# Patient Record
Sex: Female | Born: 1945 | Race: White | Hispanic: No | Marital: Married
Health system: Midwestern US, Community
[De-identification: ages and names within clinical notes are randomized; demographics above are authoritative.]

## PROBLEM LIST (undated history)

## (undated) DIAGNOSIS — E039 Hypothyroidism, unspecified: Secondary | ICD-10-CM

## (undated) DIAGNOSIS — I639 Cerebral infarction, unspecified: Secondary | ICD-10-CM

## (undated) DIAGNOSIS — I10 Essential (primary) hypertension: Secondary | ICD-10-CM

## (undated) DIAGNOSIS — E119 Type 2 diabetes mellitus without complications: Secondary | ICD-10-CM

## (undated) DIAGNOSIS — N2889 Other specified disorders of kidney and ureter: Secondary | ICD-10-CM

---

## 2003-02-26 ENCOUNTER — Other Ambulatory Visit: Admission: RE | Admit: 2003-02-26 | Discharge: 2003-02-26 | Payer: Self-pay | Admitting: Family Medicine

## 2003-06-06 ENCOUNTER — Encounter: Admission: RE | Admit: 2003-06-06 | Discharge: 2003-06-06 | Payer: Self-pay | Admitting: Family Medicine

## 2011-03-15 DIAGNOSIS — K219 Gastro-esophageal reflux disease without esophagitis: Secondary | ICD-10-CM | POA: Insufficient documentation

## 2011-03-15 DIAGNOSIS — F32A Depression, unspecified: Secondary | ICD-10-CM | POA: Insufficient documentation

## 2011-03-15 DIAGNOSIS — G2581 Restless legs syndrome: Secondary | ICD-10-CM | POA: Insufficient documentation

## 2011-03-15 DIAGNOSIS — E039 Hypothyroidism, unspecified: Secondary | ICD-10-CM | POA: Diagnosis present

## 2011-03-15 DIAGNOSIS — I10 Essential (primary) hypertension: Secondary | ICD-10-CM | POA: Diagnosis present

## 2013-06-12 DIAGNOSIS — E785 Hyperlipidemia, unspecified: Secondary | ICD-10-CM | POA: Insufficient documentation

## 2017-05-17 DIAGNOSIS — M5136 Other intervertebral disc degeneration, lumbar region: Secondary | ICD-10-CM | POA: Insufficient documentation

## 2019-12-26 ENCOUNTER — Inpatient Hospital Stay (HOSPITAL_COMMUNITY)
Admission: EM | Admit: 2019-12-26 | Discharge: 2019-12-30 | DRG: 177 | Disposition: A | Discharge status: Home Health | Payer: Medicare Other | Attending: Internal Medicine | Admitting: Internal Medicine

## 2019-12-26 ENCOUNTER — Emergency Department (HOSPITAL_COMMUNITY): Payer: Medicare Other

## 2019-12-26 ENCOUNTER — Encounter (HOSPITAL_COMMUNITY): Payer: Self-pay | Admitting: Family Medicine

## 2019-12-26 ENCOUNTER — Other Ambulatory Visit: Payer: Self-pay

## 2019-12-26 DIAGNOSIS — G2581 Restless legs syndrome: Secondary | ICD-10-CM | POA: Diagnosis present

## 2019-12-26 DIAGNOSIS — R7989 Other specified abnormal findings of blood chemistry: Secondary | ICD-10-CM | POA: Diagnosis present

## 2019-12-26 DIAGNOSIS — M549 Dorsalgia, unspecified: Secondary | ICD-10-CM | POA: Diagnosis present

## 2019-12-26 DIAGNOSIS — E1140 Type 2 diabetes mellitus with diabetic neuropathy, unspecified: Secondary | ICD-10-CM | POA: Diagnosis present

## 2019-12-26 DIAGNOSIS — G9341 Metabolic encephalopathy: Secondary | ICD-10-CM | POA: Diagnosis present

## 2019-12-26 DIAGNOSIS — J9601 Acute respiratory failure with hypoxia: Secondary | ICD-10-CM | POA: Diagnosis present

## 2019-12-26 DIAGNOSIS — R0602 Shortness of breath: Secondary | ICD-10-CM

## 2019-12-26 DIAGNOSIS — G894 Chronic pain syndrome: Secondary | ICD-10-CM | POA: Diagnosis present

## 2019-12-26 DIAGNOSIS — I444 Left anterior fascicular block: Secondary | ICD-10-CM | POA: Diagnosis present

## 2019-12-26 DIAGNOSIS — I10 Essential (primary) hypertension: Secondary | ICD-10-CM | POA: Diagnosis present

## 2019-12-26 DIAGNOSIS — G8929 Other chronic pain: Secondary | ICD-10-CM | POA: Diagnosis present

## 2019-12-26 DIAGNOSIS — R9431 Abnormal electrocardiogram [ECG] [EKG]: Secondary | ICD-10-CM | POA: Diagnosis present

## 2019-12-26 DIAGNOSIS — E1142 Type 2 diabetes mellitus with diabetic polyneuropathy: Secondary | ICD-10-CM

## 2019-12-26 DIAGNOSIS — E876 Hypokalemia: Secondary | ICD-10-CM | POA: Diagnosis present

## 2019-12-26 DIAGNOSIS — J1282 Pneumonia due to coronavirus disease 2019: Secondary | ICD-10-CM | POA: Diagnosis present

## 2019-12-26 DIAGNOSIS — U071 COVID-19: Secondary | ICD-10-CM | POA: Diagnosis present

## 2019-12-26 DIAGNOSIS — E039 Hypothyroidism, unspecified: Secondary | ICD-10-CM | POA: Diagnosis present

## 2019-12-26 DIAGNOSIS — R0902 Hypoxemia: Secondary | ICD-10-CM

## 2019-12-26 DIAGNOSIS — E785 Hyperlipidemia, unspecified: Secondary | ICD-10-CM | POA: Diagnosis present

## 2019-12-26 DIAGNOSIS — F32A Depression, unspecified: Secondary | ICD-10-CM | POA: Diagnosis present

## 2019-12-26 DIAGNOSIS — M545 Low back pain, unspecified: Secondary | ICD-10-CM | POA: Diagnosis not present

## 2019-12-26 DIAGNOSIS — E119 Type 2 diabetes mellitus without complications: Secondary | ICD-10-CM

## 2019-12-26 LAB — RESPIRATORY PANEL BY PCR

## 2019-12-26 LAB — COMPREHENSIVE METABOLIC PANEL
ALT: 58 U/L — ABNORMAL HIGH (ref 0–44)
AST: 64 U/L — ABNORMAL HIGH (ref 15–41)
Albumin: 3.9 g/dL (ref 3.5–5.0)
Alkaline Phosphatase: 69 U/L (ref 38–126)
Anion gap: 13 (ref 5–15)
BUN: 17 mg/dL (ref 8–23)
CO2: 30 mmol/L (ref 22–32)
Calcium: 9.9 mg/dL (ref 8.9–10.3)
Chloride: 97 mmol/L — ABNORMAL LOW (ref 98–111)
Creatinine, Ser: 0.62 mg/dL (ref 0.44–1.00)
GFR, Estimated: >60 mL/min (ref >60)
Glucose, Bld: 120 mg/dL — ABNORMAL HIGH (ref 70–99)
Potassium: 3.4 mmol/L — ABNORMAL LOW (ref 3.5–5.1)
Sodium: 140 mmol/L (ref 135–145)
Total Bilirubin: 0.8 mg/dL (ref 0.3–1.2)
Total Protein: 7.6 g/dL (ref 6.5–8.1)

## 2019-12-26 LAB — CBC WITH DIFFERENTIAL/PLATELET
Abs Immature Granulocytes: 0.01 10*3/uL (ref 0.00–0.07)
Basophils Absolute: 0 10*3/uL (ref 0.0–0.1)
Basophils Relative: 0 %
Eosinophils Absolute: 0 10*3/uL (ref 0.0–0.5)
Eosinophils Relative: 0 %
HCT: 42.5 % (ref 36.0–46.0)
Hemoglobin: 13.8 g/dL (ref 12.0–15.0)
Immature Granulocytes: 0 %
Lymphocytes Relative: 14 %
Lymphs Abs: 0.6 10*3/uL — ABNORMAL LOW (ref 0.7–4.0)
MCH: 32.9 pg (ref 26.0–34.0)
MCHC: 32.5 g/dL (ref 30.0–36.0)
MCV: 101.2 fL — ABNORMAL HIGH (ref 80.0–100.0)
Monocytes Absolute: 0.4 10*3/uL (ref 0.1–1.0)
Monocytes Relative: 9 %
Neutro Abs: 3.4 10*3/uL (ref 1.7–7.7)
Neutrophils Relative %: 77 %
Platelets: 164 10*3/uL (ref 150–400)
RBC: 4.2 MIL/uL (ref 3.87–5.11)
RDW: 13.7 % (ref 11.5–15.5)
WBC: 4.5 10*3/uL (ref 4.0–10.5)
nRBC: 0 % (ref 0.0–0.2)

## 2019-12-26 LAB — GLUCOSE, CAPILLARY: Glucose-Capillary: 162 mg/dL — ABNORMAL HIGH (ref 70–99)

## 2019-12-26 LAB — C-REACTIVE PROTEIN: CRP: 6.7 mg/dL — ABNORMAL HIGH (ref <1.0)

## 2019-12-26 LAB — RESPIRATORY PANEL BY RT PCR (FLU A&B, COVID)
Influenza A by PCR: NEGATIVE
Influenza B by PCR: NEGATIVE
SARS Coronavirus 2 by RT PCR: POSITIVE — AB

## 2019-12-26 LAB — FIBRINOGEN: Fibrinogen: 580 mg/dL — ABNORMAL HIGH (ref 210–475)

## 2019-12-26 LAB — PROCALCITONIN: Procalcitonin: <0.1 ng/mL

## 2019-12-26 LAB — D-DIMER, QUANTITATIVE (NOT AT ARMC): D-Dimer, Quant: 0.73 ug/mL-FEU — ABNORMAL HIGH (ref 0.00–0.50)

## 2019-12-26 LAB — LACTATE DEHYDROGENASE: LDH: 194 U/L — ABNORMAL HIGH (ref 98–192)

## 2019-12-26 MED ORDER — GUAIFENESIN-DM 100-10 MG/5ML PO SYRP: 10.0000 mL | ORAL_SOLUTION | ORAL | Status: DC | PRN

## 2019-12-26 MED ORDER — POTASSIUM CHLORIDE CRYS ER 20 MEQ PO TBCR
20.0000 meq | EXTENDED_RELEASE_TABLET | Freq: Once | ORAL | Status: AC
  Administered 2019-12-26: 20 meq via ORAL
  Filled 2019-12-26: qty 1

## 2019-12-26 MED ORDER — SODIUM CHLORIDE 0.9% FLUSH
3.0000 mL | Freq: Two times a day (BID) | INTRAVENOUS | Status: DC
  Administered 2019-12-26 – 2019-12-30 (×8): 3 mL via INTRAVENOUS

## 2019-12-26 MED ORDER — INSULIN ASPART 100 UNIT/ML ~~LOC~~ SOLN
0.0000 [IU] | Freq: Three times a day (TID) | SUBCUTANEOUS | Status: DC
  Administered 2019-12-27: 2 [IU] via SUBCUTANEOUS
  Administered 2019-12-27: 1 [IU] via SUBCUTANEOUS
  Administered 2019-12-27: 2 [IU] via SUBCUTANEOUS
  Administered 2019-12-28: 1 [IU] via SUBCUTANEOUS
  Administered 2019-12-28: 2 [IU] via SUBCUTANEOUS
  Filled 2019-12-26: qty 0.09

## 2019-12-26 MED ORDER — DULOXETINE HCL 60 MG PO CPEP
60.0000 mg | ORAL_CAPSULE | Freq: Every day | ORAL | Status: DC
  Administered 2019-12-26 – 2019-12-30 (×5): 60 mg via ORAL
  Filled 2019-12-26 (×2): qty 1
  Filled 2019-12-26: qty 2
  Filled 2019-12-26 (×2): qty 1

## 2019-12-26 MED ORDER — TRAMADOL HCL 50 MG PO TABS
50.0000 mg | ORAL_TABLET | Freq: Four times a day (QID) | ORAL | Status: DC | PRN
  Administered 2019-12-26 – 2019-12-28 (×3): 50 mg via ORAL
  Filled 2019-12-26 (×4): qty 1

## 2019-12-26 MED ORDER — HYDROCOD POLST-CPM POLST ER 10-8 MG/5ML PO SUER
5.0000 mL | Freq: Two times a day (BID) | ORAL | Status: DC | PRN
  Administered 2019-12-26: 5 mL via ORAL
  Filled 2019-12-26: qty 5

## 2019-12-26 MED ORDER — SODIUM CHLORIDE 0.9 % IV SOLN
200.0000 mg | Freq: Once | INTRAVENOUS | Status: AC
  Administered 2019-12-26: 200 mg via INTRAVENOUS
  Filled 2019-12-26: qty 40

## 2019-12-26 MED ORDER — LEVOTHYROXINE SODIUM 25 MCG PO TABS
125.0000 ug | ORAL_TABLET | Freq: Every day | ORAL | Status: DC
  Administered 2019-12-27 – 2019-12-30 (×4): 125 ug via ORAL
  Filled 2019-12-26 (×4): qty 1

## 2019-12-26 MED ORDER — HYDROCHLOROTHIAZIDE 25 MG PO TABS
50.0000 mg | ORAL_TABLET | Freq: Every day | ORAL | Status: DC
  Administered 2019-12-26 – 2019-12-30 (×5): 50 mg via ORAL
  Filled 2019-12-26 (×5): qty 2

## 2019-12-26 MED ORDER — AMITRIPTYLINE HCL 25 MG PO TABS
25.0000 mg | ORAL_TABLET | Freq: Every day | ORAL | Status: DC
  Administered 2019-12-26 – 2019-12-29 (×4): 25 mg via ORAL
  Filled 2019-12-26 (×4): qty 1

## 2019-12-26 MED ORDER — ZINC SULFATE 220 (50 ZN) MG PO CAPS
220.0000 mg | ORAL_CAPSULE | Freq: Every day | ORAL | Status: DC
  Administered 2019-12-27 – 2019-12-30 (×4): 220 mg via ORAL
  Filled 2019-12-26 (×4): qty 1

## 2019-12-26 MED ORDER — SODIUM CHLORIDE 0.9 % IV SOLN
100.0000 mg | Freq: Every day | INTRAVENOUS | Status: AC
  Administered 2019-12-27 – 2019-12-30 (×4): 100 mg via INTRAVENOUS
  Filled 2019-12-26 (×4): qty 20

## 2019-12-26 MED ORDER — ACETAMINOPHEN 325 MG PO TABS: 650.0000 mg | ORAL_TABLET | Freq: Four times a day (QID) | ORAL | Status: DC | PRN

## 2019-12-26 MED ORDER — ENOXAPARIN SODIUM 40 MG/0.4ML ~~LOC~~ SOLN
40.0000 mg | SUBCUTANEOUS | Status: DC
  Administered 2019-12-27 – 2019-12-30 (×4): 40 mg via SUBCUTANEOUS
  Filled 2019-12-26 (×4): qty 0.4

## 2019-12-26 MED ORDER — ASCORBIC ACID 500 MG PO TABS
500.0000 mg | ORAL_TABLET | Freq: Every day | ORAL | Status: DC
  Administered 2019-12-27 – 2019-12-30 (×4): 500 mg via ORAL
  Filled 2019-12-26 (×4): qty 1

## 2019-12-26 MED ORDER — ATENOLOL 50 MG PO TABS
50.0000 mg | ORAL_TABLET | Freq: Every day | ORAL | Status: DC
  Administered 2019-12-26 – 2019-12-29 (×4): 50 mg via ORAL
  Filled 2019-12-26 (×5): qty 1

## 2019-12-26 MED ORDER — INSULIN ASPART 100 UNIT/ML ~~LOC~~ SOLN
0.0000 [IU] | Freq: Every day | SUBCUTANEOUS | Status: DC
  Administered 2019-12-27: 2 [IU] via SUBCUTANEOUS
  Filled 2019-12-26: qty 0.05

## 2019-12-26 MED ORDER — METHYLPREDNISOLONE SODIUM SUCC 125 MG IJ SOLR
60.0000 mg | Freq: Two times a day (BID) | INTRAMUSCULAR | Status: DC
  Administered 2019-12-26 – 2019-12-30 (×8): 60 mg via INTRAVENOUS
  Filled 2019-12-26 (×8): qty 2

## 2019-12-26 NOTE — ED Notes (Signed)
   12/26/19 1830  Vitals  BP (!) 185/114  MAP (mmHg) 133  Pulse Rate (!) 101  ECG Heart Rate (!) 103  Resp 18  MEWS COLOR  MEWS Score Color Green  Oxygen Therapy  SpO2 95 %  O2 Device Nasal Cannula  O2 Flow Rate (L/min) 3 L/min  MEWS Score  MEWS Temp 0  MEWS Systolic 0  MEWS Pulse 1  MEWS RR 0  MEWS LOC 0  MEWS Score 1  Grunz, MD aware aware of patient's elevated BP.  Daily BP meds ordered.  Will give when received from pharmacy.

## 2019-12-26 NOTE — H&P (Signed)
History and Physical   Rhonda Winters:[id number] DOB: [birth year] DOA: 12/26/2019  Referring MD/NP/PA: Dr. Eulis Foster, [place] PCP: [facility]  Patient coming from: Home  Chief Complaint: Poor per oral intake  HPI: Rhonda Winters is a 74 y.o. female with a history of HTN, NIDT2DM, hypothyroidism, chronic back pain who presented to the ED 10/28 with lethargy, confusion, poor oral intake for 4 days. Her husband supplements history stating 4 nights ago she went to bed feeling feverish and has since become more and more weak with cough, not taking medications. She's had nausea and some dry heaves intermittently associated with this. Daughter recently diagnosed with covid and had spent time around the patient.  ED Course: 99.23F, tachycardic, hypertensive, and hypoxic requiring 3L O2. CXR revealed bilateral infiltrates most prominent in LUL, right base, SARS-CoV-2 PCR positive, and CRP 6.7. Hospitalists called to admit for acute hypoxic respiratory failure due to covid-19 pneumonia.    Review of Systems: Denies sore throat, no current chest pain, palpitations, shortness of breath, orthopnea, leg swelling (in fact these are often swollen but less so now), abdominal pain, changes in bowel habits, blood in stool, change in bladder habits, myalgias, arthralgias, rash, and per HPI. All others reviewed and are negative.   PMH: T2DM on metformin, HLD with statin intolerance, HTN on atenolol, HCTZ, possible renal CA under surveillance per pt's husband, chronic back pain getting intermittent steroid injections, taking diclofenac, cymbalta, tramadol. Not on DMARDs. No hx liver disease, TB, or dyscrasias.  PSH: No surgeries reported  Meds: As above.   Allergies: NKDA, rosuvastatin caused thigh myalgias  FH: No history of immunodeficiencies.  SH: Lives with husband in [place], no tobacco use, drinks a glass of wine on average every 6-38 days, no illicit drugs reported.  Physical Exam: Vitals:   12/26/19  1600 12/26/19 1630 12/26/19 1700 12/26/19 1705  BP: (!) 185/109 (!) 162/109 (!) 181/103   Pulse: 100  (!) 115   Resp: (!) 28 (!) 24 (!) 21   Temp:      TempSrc:      SpO2: 97%  95% 95%   Constitutional: 74 y.o. female in no distress, calm demeanor Eyes: Lids and conjunctivae normal, PERRL ENMT: Mucous membranes are moist. Fair dentition. Neck: normal, supple, no masses, no thyromegaly Respiratory: Non-labored breathing 3L O2 without accessory muscle use. Slight cracles at R base, diminished diffusely. Cardiovascular: Regular tachycardia, no murmurs, rubs, or gallops. No carotid bruits. No JVD. No pitting LE edema. Palpable pedal pulses. Abdomen: Normoactive bowel sounds. No tenderness, non-distended, and no masses palpated. No hepatosplenomegaly. GU: No indwelling catheter Musculoskeletal: No clubbing / cyanosis. No joint deformity upper and lower extremities. Good ROM, no contractures. Normal muscle tone.  Skin: Warm, dry. No rashes, wounds, or ulcers. No significant lesions noted.  Neurologic: CN II-XII grossly intact. Speech normal. No focal deficits in motor strength or sensation in all extremities.  Psychiatric: Alert and oriented x3, slowed cognition and impaired recall.   Labs on Admission: I have personally reviewed following labs and imaging studies  CBC: Recent Labs  Lab 12/26/19 1509  WBC 4.5  NEUTROABS 3.4  HGB 13.8  HCT 42.5  MCV 101.2*  PLT 756   Basic Metabolic Panel: Recent Labs  Lab 12/26/19 1509  NA 140  K 3.4*  CL 97*  CO2 30  GLUCOSE 120*  BUN 17  CREATININE 0.62  CALCIUM 9.9   GFR: CrCl cannot be calculated (Unknown ideal weight.). Liver Function Tests: Recent Labs  Lab 12/26/19  1509  AST 64*  ALT 58*  ALKPHOS 69  BILITOT 0.8  PROT 7.6  ALBUMIN 3.9   No results for input(s): LIPASE, AMYLASE in the last 168 hours. No results for input(s): AMMONIA in the last 168 hours. Coagulation Profile: No results for input(s): INR, PROTIME in the  last 168 hours. Cardiac Enzymes: No results for input(s): CKTOTAL, CKMB, CKMBINDEX, TROPONINI in the last 168 hours. BNP (last 3 results) No results for input(s): PROBNP in the last 8760 hours. HbA1C: No results for input(s): HGBA1C in the last 72 hours. CBG: No results for input(s): GLUCAP in the last 168 hours. Lipid Profile: No results for input(s): CHOL, HDL, LDLCALC, TRIG, CHOLHDL, LDLDIRECT in the last 72 hours. Thyroid Function Tests: No results for input(s): TSH, T4TOTAL, FREET4, T3FREE, THYROIDAB in the last 72 hours. Anemia Panel: No results for input(s): VITAMINB12, FOLATE, FERRITIN, TIBC, IRON, RETICCTPCT in the last 72 hours. Urine analysis: No results found for: COLORURINE, APPEARANCEUR, LABSPEC, PHURINE, GLUCOSEU, HGBUR, BILIRUBINUR, KETONESUR, PROTEINUR, UROBILINOGEN, NITRITE, LEUKOCYTESUR  Recent Results (from the past 240 hour(s))  Respiratory Panel by RT PCR (Flu A&B, Covid) - Nasopharyngeal Swab     Status: Abnormal   Collection Time: 12/26/19  3:11 PM   Specimen: Nasopharyngeal Swab  Result Value Ref Range Status   SARS Coronavirus 2 by RT PCR POSITIVE (A) NEGATIVE Final    Comment: RESULT CALLED TO, READ BACK BY AND VERIFIED WITH: FRANKLIN,C. RN @1709  ON 10.28.2021 BY COHEN,K (NOTE) SARS-CoV-2 target nucleic acids are DETECTED.  SARS-CoV-2 RNA is generally detectable in upper respiratory specimens  during the acute phase of infection. Positive results are indicative of the presence of the identified virus, but do not rule out bacterial infection or co-infection with other pathogens not detected by the test. Clinical correlation with patient history and other diagnostic information is necessary to determine patient infection status. The expected result is Negative.  Fact Sheet for Patients:  PinkCheek.be  Fact Sheet for Healthcare Providers: GravelBags.it  This test is not yet approved or cleared  by the [place] FDA and  has been authorized for detection and/or diagnosis of SARS-CoV-2 by FDA under an Emergency Use Authorization (EUA).  This EUA will remain in effect (meaning this t est can be used) for the duration of  the COVID-19 declaration under Section 564(b)(1) of the Act, 21 U.S.C. section 360bbb-3(b)(1), unless the authorization is terminated or revoked sooner.      Influenza A by PCR NEGATIVE NEGATIVE Final   Influenza B by PCR NEGATIVE NEGATIVE Final    Comment: (NOTE) The Xpert Xpress SARS-CoV-2/FLU/RSV assay is intended as an aid in  the diagnosis of influenza from Nasopharyngeal swab specimens and  should not be used as a sole basis for treatment. Nasal washings and  aspirates are unacceptable for Xpert Xpress SARS-CoV-2/FLU/RSV  testing.  Fact Sheet for Patients: PinkCheek.be  Fact Sheet for Healthcare Providers: GravelBags.it  This test is not yet approved or cleared by the [place] FDA and  has been authorized for detection and/or diagnosis of SARS-CoV-2 by  FDA under an Emergency Use Authorization (EUA). This EUA will remain  in effect (meaning this test can be used) for the duration of the  Covid-19 declaration under Section 564(b)(1) of the Act, 21  U.S.C. section 360bbb-3(b)(1), unless the authorization is  terminated or revoked. Performed at [facility], [place] [address], [place], [place] [address]      Radiological Exams on Admission: Portable chest 1 View  Result  Date: 12/26/2019 CLINICAL DATA:  Concern for possible COVID infection. Chills, cough, headache. EXAM: PORTABLE CHEST 1 VIEW COMPARISON:  None FINDINGS: Lungs are suboptimally inflated. Cardiomediastinal contours are unremarkable. Aortic atherosclerotic calcifications noted. Airspace densities identified within the right upper lobe and left lung base. Mildly increased interstitial markings noted.  Visualized osseous structures unremarkable. IMPRESSION: 1. Right upper lobe and left lung base airspace opacities compatible with multifocal infection. 2. Aortic atherosclerotic disease. Electronically Signed   By: Kerby Moors M.D.   On: 12/26/2019 16:06    EKG: Independently reviewed. NSR w/ vent rate 91bpm, leftward axis and prolonged QT interval. No ST elevations.  Assessment/Plan Active Problems:   Acute hypoxemic respiratory failure due to COVID-19 [facility])    Acute hypoxemic respiratory failure due to covid-19 pneumonia: SARS-CoV-2 PCR positive on 10/28 in unvaccinated patient.  - Continue remdesivir x5 days (10/28 - 11/1) - Start solumedrol - Hypoxemia not severe enough to warrant tocilizumab, baricitinib, etc. - Encourage OOB, IS, FV, and awake proning if able - Continue airborne, contact precautions for 21 days from positive testing. - Monitor CMP and inflammatory markers - Enoxaparin prophylactic dose.  - Encouraged to get vaccine after resolution of this illness. Discussed CDC quarantine guidelines with husband.  NIDT2DM:  - Start sensitive SSI and HS correction. Titrate as needed, especially in light of steroids as above. - Check HbA1c - Hold oral agents.   Acute metabolic encephalopathy: Nonfocal. - Monitor with treatment for covid-19.   LFT elevation: Mild, likely due to covid viral illness.  - Monitor.   Hypokalemia:  - Supplement and monitor  Hypertension: With elevated BPs in ED.  - Give home atenolol and HCTZ now, can use hydralazine prn  Chronic pain:  - Continue cymbalta, tylenol - Hold voltaren with concern for dehydration. Hold gabapentin with concern for lethargy.  Hypothyroidism:  - Continue thyroid supplement  RLS:  - Continue elavil qHS  Prolonged QTc:  - Avoid provocative agents.  - Recheck ECG in AM  DVT prophylaxis: Lovenox  Code Status: Full  Family Communication: Husband at bedside, reiterated no visitor policy for covid patients and  confirmed he is the once daily point of contact for MD's.  Disposition Plan: Admit to inpatient, will get PT/OT due to weakness to inform next venue Consults called: None  Admission status: Inpatient    Patrecia Pour, MD [facility] [email or web address] 12/26/2019, 5:39 PM

## 2019-12-26 NOTE — ED Notes (Signed)
Attempted to give report for patient, according to floor the bed has not been approved yet and they will have to call me back.

## 2019-12-26 NOTE — ED Triage Notes (Signed)
Patient BIB [facility] c/o possible Covid.  Patient has chills, cough, headache, and feels like she is going to die.  Patient from home.  Patient has been sick for 4 days per her husband.  Patient is alert to self and situation.    Vitals were  88-90% RA 163/100 118 CBG  96.8 temp 62 HR  16 RR

## 2019-12-26 NOTE — ED Notes (Signed)
X RAY at bedside 

## 2019-12-26 NOTE — ED Provider Notes (Signed)
[facility] DEPT Provider Note   CSN: [id number] Arrival date & time: 12/26/19  1409     History Chief Complaint  Patient presents with  . Cough  . Chills  . Generalized Body Aches  . Fever    Rhonda Winters is a 74 y.o. female.  HPI Patient here for illness, for the last 5 days.  She is unable to give history.  Her husband is with her and states that she has had gradually worsening confusion over the last 5 days.  She is not taking any of her medications because of her current illness.  She has fever, chills, cough, rhinorrhea and anorexia.  She has not had a Covid vaccine.  Her husband is not currently ill and has not been vaccinated.  There are no other known modifying factors.      Patient Active Problem List   Diagnosis Date Noted  . Acute hypoxemic respiratory failure due to COVID-19 [facility]) 12/26/2019    History reviewed. No pertinent surgical history.   OB History   No obstetric history on file.     No family history on file.  Social History   Tobacco Use  . Smoking status: Not on file  Substance Use Topics  . Alcohol use: Not on file  . Drug use: Not on file    Home Medications Prior to Admission medications   Medication Sig Start Date End Date Taking? Authorizing Provider  acetaminophen (TYLENOL) 500 MG tablet Take 1,000 mg by mouth every 6 (six) hours as needed for moderate pain.   Yes [provider]  amitriptyline (ELAVIL) 25 MG tablet Take 25 mg by mouth at bedtime. 11/18/19  Yes [provider]  atenolol (TENORMIN) 50 MG tablet Take 50 mg by mouth daily. 11/11/19  Yes [provider]  diclofenac (VOLTAREN) 75 MG EC tablet Take 75 mg by mouth 2 (two) times daily. 11/18/19  Yes [provider]  DULoxetine (CYMBALTA) 60 MG capsule Take 60 mg by mouth daily. 11/18/19  Yes [provider]  EUTHYROX 125 MCG tablet Take 125 mcg by mouth daily. 09/02/19  Yes [provider]   furosemide (LASIX) 20 MG tablet Take 20 mg by mouth daily as needed for fluid.  08/21/19  Yes [provider]  gabapentin (NEURONTIN) 800 MG tablet Take 800 mg by mouth 2 (two) times daily.  11/18/19  Yes [provider]  hydrochlorothiazide (HYDRODIURIL) 50 MG tablet Take 50 mg by mouth daily. 11/18/19  Yes [provider]  metFORMIN (GLUCOPHAGE-XR) 500 MG 24 hr tablet Take 750 mg by mouth daily.  11/18/19  Yes [provider]    Allergies    Patient has no known allergies.  Review of Systems   Review of Systems  All other systems reviewed and are negative.   Physical Exam Updated Vital Signs BP (!) 181/103   Pulse (!) 115   Temp 99.1 F (37.3 C) (Oral)   Resp (!) 21   SpO2 95%   Physical Exam Vitals and nursing note reviewed.  Constitutional:      General: She is not in acute distress.    Appearance: She is well-developed. She is ill-appearing. She is not toxic-appearing or diaphoretic.  HENT:     Head: Normocephalic and atraumatic.     Right Ear: External ear normal.     Left Ear: External ear normal.     Mouth/Throat:     Mouth: Mucous membranes are dry.  Pharynx: No oropharyngeal exudate or posterior oropharyngeal erythema.  Eyes:     Conjunctiva/sclera: Conjunctivae normal.     Pupils: Pupils are equal, round, and reactive to light.  Neck:     Trachea: Phonation normal.  Cardiovascular:     Rate and Rhythm: Tachycardia present.  Pulmonary:     Effort: Pulmonary effort is normal.  Abdominal:     General: There is no distension.     Palpations: Abdomen is soft.     Tenderness: There is no abdominal tenderness.  Musculoskeletal:        General: No swelling, tenderness or signs of injury. Normal range of motion.     Cervical back: Normal range of motion and neck supple.  Skin:    General: Skin is warm and dry.  Neurological:     Mental Status: She is alert. She is disoriented.     Cranial Nerves: No cranial nerve deficit.      Motor: No abnormal muscle tone.     Coordination: Coordination normal.  Psychiatric:     Comments: Alert and responsive, confused.     ED Results / Procedures / Treatments   Labs (all labs ordered are listed, but only abnormal results are displayed) Labs Reviewed  RESPIRATORY PANEL BY RT PCR (FLU A&B, COVID) - Abnormal; Notable for the following components:      Result Value   SARS Coronavirus 2 by RT PCR POSITIVE (*)    All other components within normal limits  C-REACTIVE PROTEIN - Abnormal; Notable for the following components:   CRP 6.7 (*)    All other components within normal limits  COMPREHENSIVE METABOLIC PANEL - Abnormal; Notable for the following components:   Potassium 3.4 (*)    Chloride 97 (*)    Glucose, Bld 120 (*)    AST 64 (*)    ALT 58 (*)    All other components within normal limits  CBC WITH DIFFERENTIAL/PLATELET - Abnormal; Notable for the following components:   MCV 101.2 (*)    Lymphs Abs 0.6 (*)    All other components within normal limits  D-DIMER, QUANTITATIVE (NOT AT [facility]) - Abnormal; Notable for the following components:   D-Dimer, Quant 0.73 (*)    All other components within normal limits  LACTATE DEHYDROGENASE - Abnormal; Notable for the following components:   LDH 194 (*)    All other components within normal limits  FIBRINOGEN - Abnormal; Notable for the following components:   Fibrinogen 580 (*)    All other components within normal limits  RESPIRATORY PANEL BY PCR  PROCALCITONIN  HEMOGLOBIN A1C    EKG EKG Interpretation  Date/Time:  Thursday December 26 2019 14:35:22 EDT Ventricular Rate:  91 PR Interval:    QRS Duration: 94 QT Interval:  445 QTC Calculation: 548 R Axis:   -35 Text Interpretation: Sinus rhythm Left axis deviation Probable anterior infarct, age indeterminate Prolonged QT interval No old tracing to compare Confirmed by Daleen Bo [id number]) on 12/26/2019 3:59:12 PM   Radiology Portable chest 1  View  Result Date: 12/26/2019 CLINICAL DATA:  Concern for possible COVID infection. Chills, cough, headache. EXAM: PORTABLE CHEST 1 VIEW COMPARISON:  None FINDINGS: Lungs are suboptimally inflated. Cardiomediastinal contours are unremarkable. Aortic atherosclerotic calcifications noted. Airspace densities identified within the right upper lobe and left lung base. Mildly increased interstitial markings noted. Visualized osseous structures unremarkable. IMPRESSION: 1. Right upper lobe and left lung base airspace opacities compatible with multifocal infection. 2. Aortic atherosclerotic disease. Electronically Signed  By: Kerby Moors M.D.   On: 12/26/2019 16:06    Procedures .Critical Care Performed by: Daleen Bo, MD Authorized by: Daleen Bo, MD   Critical care provider statement:    Critical care time (minutes):  45   Critical care start time:  12/26/2019 3:05 PM   Critical care end time:  12/26/2019 5:39 PM   Critical care time was exclusive of:  Separately billable procedures and treating other patients   Critical care was necessary to treat or prevent imminent or life-threatening deterioration of the following conditions:  Respiratory failure   Critical care was time spent personally by me on the following activities:  Blood draw for specimens, development of treatment plan with patient or surrogate, discussions with consultants, evaluation of patient's response to treatment, examination of patient, obtaining history from patient or surrogate, ordering and performing treatments and interventions, ordering and review of laboratory studies, pulse oximetry, re-evaluation of patient's condition, review of old charts and ordering and review of radiographic studies   (including critical care time)  Medications Ordered in ED Medications  remdesivir 200 mg in sodium chloride 0.9% 250 mL IVPB (has no administration in time range)    Followed by  remdesivir 100 mg in sodium chloride 0.9  % 100 mL IVPB (has no administration in time range)  potassium chloride SA (KLOR-CON) CR tablet 20 mEq (has no administration in time range)  methylPREDNISolone sodium succinate (SOLU-MEDROL) 125 mg/2 mL injection 60 mg (has no administration in time range)  insulin aspart (novoLOG) injection 0-9 Units (has no administration in time range)  insulin aspart (novoLOG) injection 0-5 Units (has no administration in time range)    ED Course  I have reviewed the triage vital signs and the nursing notes.  Pertinent labs & imaging results that were available during my care of the patient were reviewed by me and considered in my medical decision making (see chart for details).    MDM Rules/Calculators/A&P                           Patient Vitals for the past 24 hrs:  BP Temp Temp src Pulse Resp SpO2  12/26/19 1705 -- -- -- -- -- 95 %  12/26/19 1700 (!) 181/103 -- -- (!) 115 (!) 21 95 %  12/26/19 1630 (!) 162/109 -- -- -- (!) 24 --  12/26/19 1600 (!) 185/109 -- -- 100 (!) 28 97 %  12/26/19 1547 -- -- -- -- -- 94 %  12/26/19 1541 (!) 184/121 -- -- 97 18 95 %  12/26/19 1459 (!) 181/115 -- -- 97 18 95 %  12/26/19 1435 -- -- -- 92 (!) 32 96 %  12/26/19 1433 -- 99.1 F (37.3 C) Oral -- -- --  12/26/19 1432 -- -- -- -- -- (!) 88 %  12/26/19 1431 (!) 169/110 -- -- 94 20 (!) 86 %    5:24 PM Reevaluation with update and discussion. After initial assessment and treatment, an updated evaluation reveals no change in clinical status, findings discussed with patient and husband, all questions answered. Daleen Bo   Medical Decision Making:  This patient is presenting for evaluation of upper respiratory illness, which does require a range of treatment options, and is a complaint that involves a high risk of morbidity and mortality. The differential diagnoses include Covid infection, influenza, pneumonia, metabolic disorder. I decided to review old records, and in summary elderly female, with 5-day  illness, primary respiratory, likely  viral.  I obtained additional historical information from husband at the bedside.  Clinical Laboratory Tests Ordered, included CBC, Metabolic panel and Covid inflammatory markers, Covid test, influenza test. Review indicates Covid infection, moderate inflammatory response reassuring electrolytes. Radiologic Tests Ordered, included chest x-ray.  I independently Visualized: Radiographic images, which show multifocal pneumonia  Cardiac Monitor Tracing which shows sinus tachycardia     Critical Interventions-clinical evaluation, laboratory testing, radiography, observation and reassessment  After These Interventions, the Patient was reevaluated and was found with hypoxia on room air, improving to normal on nasal cannula oxygen at 4 L.  Illness identified as COVID-19 infection.  Inflammatory response is present, likely contributing to confusion, malaise and anorexia.  She will require hospitalization for stabilization.  Remdesivir ordered in the emergency department.  Rhonda Winters was evaluated in Emergency Department on 12/26/2019 for the symptoms described in the history of present illness. She was evaluated in the context of the global COVID-19 pandemic, which necessitated consideration that the patient might be at risk for infection with the SARS-CoV-2 virus that causes COVID-19. Institutional protocols and algorithms that pertain to the evaluation of patients at risk for COVID-19 are in a state of rapid change based on information released by regulatory bodies including the CDC and federal and state organizations. These policies and algorithms were followed during the patient's care in the ED.  CRITICAL CARE-yes Performed by: Daleen Bo  Nursing Notes Reviewed/ Care Coordinated Applicable Imaging Reviewed Interpretation of Laboratory Data incorporated into ED treatment  5:27 PM-Consult complete with hospitalist. Patient case explained and discussed.  He agrees to admit patient for further evaluation and treatment. Call ended at 5:35 PM  Plan: Admit    Final Clinical Impression(s) / ED Diagnoses Final diagnoses:  Shortness of breath  COVID-19  Hypoxia    Rx / DC Orders ED Discharge Orders    None       Daleen Bo, MD 12/26/19 [phone]

## 2019-12-27 ENCOUNTER — Encounter (HOSPITAL_COMMUNITY): Payer: Self-pay | Admitting: Family Medicine

## 2019-12-27 LAB — COMPREHENSIVE METABOLIC PANEL
ALT: 52 U/L — ABNORMAL HIGH (ref 0–44)
AST: 53 U/L — ABNORMAL HIGH (ref 15–41)
Albumin: 3.4 g/dL — ABNORMAL LOW (ref 3.5–5.0)
Alkaline Phosphatase: 64 U/L (ref 38–126)
Anion gap: 12 (ref 5–15)
BUN: 24 mg/dL — ABNORMAL HIGH (ref 8–23)
CO2: 27 mmol/L (ref 22–32)
Calcium: 9.5 mg/dL (ref 8.9–10.3)
Chloride: 98 mmol/L (ref 98–111)
Creatinine, Ser: 0.74 mg/dL (ref 0.44–1.00)
GFR, Estimated: >60 mL/min (ref >60)
Glucose, Bld: 171 mg/dL — ABNORMAL HIGH (ref 70–99)
Potassium: 3.8 mmol/L (ref 3.5–5.1)
Sodium: 137 mmol/L (ref 135–145)
Total Bilirubin: 0.5 mg/dL (ref 0.3–1.2)
Total Protein: 7 g/dL (ref 6.5–8.1)

## 2019-12-27 LAB — GLUCOSE, CAPILLARY
Glucose-Capillary: 143 mg/dL — ABNORMAL HIGH (ref 70–99)
Glucose-Capillary: 146 mg/dL — ABNORMAL HIGH (ref 70–99)
Glucose-Capillary: 165 mg/dL — ABNORMAL HIGH (ref 70–99)
Glucose-Capillary: 168 mg/dL — ABNORMAL HIGH (ref 70–99)
Glucose-Capillary: 193 mg/dL — ABNORMAL HIGH (ref 70–99)
Glucose-Capillary: 214 mg/dL — ABNORMAL HIGH (ref 70–99)

## 2019-12-27 LAB — C-REACTIVE PROTEIN: CRP: 7.6 mg/dL — ABNORMAL HIGH (ref <1.0)

## 2019-12-27 LAB — D-DIMER, QUANTITATIVE: D-Dimer, Quant: 0.65 ug/mL-FEU — ABNORMAL HIGH (ref 0.00–0.50)

## 2019-12-27 MED ORDER — IPRATROPIUM-ALBUTEROL 20-100 MCG/ACT IN AERS
1.0000 | INHALATION_SPRAY | Freq: Four times a day (QID) | RESPIRATORY_TRACT | Status: DC
  Administered 2019-12-27 – 2019-12-30 (×12): 1 via RESPIRATORY_TRACT
  Filled 2019-12-27: qty 4

## 2019-12-27 MED ORDER — ALBUTEROL SULFATE HFA 108 (90 BASE) MCG/ACT IN AERS: 1.0000 | INHALATION_SPRAY | RESPIRATORY_TRACT | Status: DC | PRN

## 2019-12-27 MED ORDER — ADULT MULTIVITAMIN W/MINERALS CH
1.0000 | ORAL_TABLET | Freq: Every day | ORAL | Status: DC
  Administered 2019-12-27 – 2019-12-30 (×4): 1 via ORAL
  Filled 2019-12-27 (×4): qty 1

## 2019-12-27 MED ORDER — PROSOURCE PLUS PO LIQD
30.0000 mL | Freq: Every day | ORAL | Status: DC
  Administered 2019-12-27: 30 mL via ORAL
  Filled 2019-12-27: qty 30

## 2019-12-27 MED ORDER — HYDROCOD POLST-CPM POLST ER 10-8 MG/5ML PO SUER
5.0000 mL | Freq: Two times a day (BID) | ORAL | Status: DC
  Administered 2019-12-27 – 2019-12-30 (×6): 5 mL via ORAL
  Filled 2019-12-27 (×6): qty 5

## 2019-12-27 NOTE — Plan of Care (Signed)
  Problem: Respiratory: Goal: Will maintain a patent airway Outcome: Progressing Goal: Complications related to the disease process, condition or treatment will be avoided or minimized Outcome: Progressing   Problem: RH SAFETY Goal: RH STG ADHERE TO SAFETY PRECAUTIONS W/ASSISTANCE/DEVICE Description: STG Adhere to Safety Precautions With Assistance/Device. Outcome: Progressing Goal: RH STG DECREASED RISK OF FALL WITH ASSISTANCE Description: STG Decreased Risk of Fall With Assistance. Outcome: Progressing   Problem: RH COGNITION-NURSING Goal: RH STG ANTICIPATES NEEDS/CALLS FOR ASSIST W/ASSIST/CUES Description: STG Anticipates Needs/Calls for Assist With Assistance/Cues. Outcome: Progressing   Problem: RH PAIN MANAGEMENT Goal: RH STG PAIN MANAGED AT OR BELOW PT'S PAIN GOAL Outcome: Progressing

## 2019-12-27 NOTE — Progress Notes (Signed)
PROGRESS NOTE    Rhonda Winters  PFX:[id number] DOB: October 04, 1945 DOA: 12/26/2019 PCP: Patient, No Pcp Per    Brief Narrative:  Rhonda Winters admitted to the hospital with a working diagnosis of acute hypoxic respiratory failure due to SARS COVID-19 viral pneumonia.  74 year old female past medical history for hypertension, type II that is mellitus, hypothyroidism and chronic back pain.  Patient reported 4 days of poor oral intake, confusion and lethargy.  Positive fever at home, cough and nausea.  Positive sick contacts at home.  On her initial physical examination she was afebrile, blood pressure 185/109, heart rate 115, respiratory rate 28, oxygen saturation 95% on supplemental oxygen, her lungs had no wheezing, mild Rales at the right base, diffusely diminished breath sounds, heart S1-S2, present rhythmic, soft abdomen, no lower extremity edema. SARS COVID-19 positive.  Chest radiograph with bilateral interstitial infiltrates, right upper lobe and more dense on the left lower lobe.  Patient has been placed on supplemental oxygen per nasal cannula, medical therapy with systemic corticosteroids and remdesivir.  Assessment & Plan:   Principal Problem:   Acute hypoxemic respiratory failure due to COVID-19 [facility]) Active Problems:   HTN (hypertension)   Diabetic neuropathy associated with type 2 diabetes mellitus (HCC)   Chronic back pain   T2DM (type 2 diabetes mellitus) (HCC)   Hypokalemia   Elevated LFTs   Hypothyroidism   1.  Acute hypoxic respiratory failure due to SARS COVID-19 viral pneumonia.  RR: 20  Pulse oxymetry: 93% Fi02: 3 L/ min per [place]  COVID-19 Labs  Recent Labs    12/26/19 1509 12/27/19 0356  DDIMER 0.73* 0.65*  LDH 194*  --   CRP 6.7* 7.6*    Lab Results  Component Value Date   SARSCOV2NAA POSITIVE (A) 12/26/2019   Continue with elevated inflammatory markers, and low oxygen requirements. LFT are trending down AST 53 and ALT 52.   Medical therapy  with systemic steroids (methylprednisolone 60 mg iv q12) and remdesivir #2/5. Continue with antitussive agents, bronchodilators and airway clearing techniques.  Out of bed to the chair tid with meals, pt and ot evaluation.   Continue to follow inflammatory markers, keep oxygen saturation more than 88%.    2. T2DM. Continue glucose control and coverage with insulin sliding scale, patient is tolerating po well, no nausea or vomiting,   Fasting glucose this am 171.  3. Acute metabolic encephalopathy. Clinically improving, patient today is awake and alert,   4. HTN. Continue blood pressure control with atenolol and hctz.   5. Hypokalemia. Renal function stable with serum cr at 0,74, K at 3,8 and bicarbonate at 27. Continue to follow up on renal panel in am.   6. Chronic pain syndrome/ restless leg syndrome/ depression. Will resume gabapentin and continue with cymbalta.  On amitriptyline   7. Prolonged Qtc  Follow up ekg with HR 54, left axis deviation, left anterior fascicular block, Qtc 415 with simus rhythm, poor R wave progression, no St segment or T wave changes.   8. Hypothyroid. Continue with levothyroxine.   Patient continue to be at high risk for worsening respiratory failure.   Status is: Inpatient  Remains inpatient appropriate because:IV treatments appropriate due to intensity of illness or inability to take PO   Dispo: The patient is from: Home              Anticipated d/c is to: Home              Anticipated d/c date is:  3 days              Patient currently is not medically stable to d/c.   DVT prophylaxis: Enoxaparin   Code Status:   Full   Family Communication:  I was not able to reach her husband over the phone, will re attempt at a later time.      Subjective: Patient continue to have dyspnea, improved but not yet back to baseline, no chest pain, no nausea or vomiting.   Objective: Vitals:   12/26/19 2138 12/27/19 0032 12/27/19 0239 12/27/19 0940  BP: (!)  181/95  (!) 152/107   Pulse: 67  61   Resp: 20  20   Temp: 97.8 F (36.6 C)     TempSrc: Oral     SpO2: 94%   93%  Weight:  99.6 kg    Height:  5\' 5"  (1.651 m)      Intake/Output Summary (Last 24 hours) at 12/27/2019 1219 Last data filed at 12/26/2019 2050 Gross per 24 hour  Intake 300 ml  Output --  Net 300 ml   Filed Weights   12/27/19 0032  Weight: 99.6 kg    Examination:   General: Not in pain or dyspnea, deconditioned  Neurology: Awake and alert, non focal  E ENT: mild pallor, no icterus, oral mucosa moist Cardiovascular: No JVD. S1-S2 present, rhythmic. No lower extremity edema. Pulmonary: vesicular with no wheezing, Gastrointestinal. Abdomen soft and non tender Skin. No rashes Musculoskeletal: no joint deformities     Data Reviewed: I have personally reviewed following labs and imaging studies  CBC: Recent Labs  Lab 12/26/19 1509  WBC 4.5  NEUTROABS 3.4  HGB 13.8  HCT 42.5  MCV 101.2*  PLT 259   Basic Metabolic Panel: Recent Labs  Lab 12/26/19 1509 12/27/19 0356  NA 140 137  K 3.4* 3.8  CL 97* 98  CO2 30 27  GLUCOSE 120* 171*  BUN 17 24*  CREATININE 0.62 0.74  CALCIUM 9.9 9.5   GFR: Estimated Creatinine Clearance: 72.1 mL/min (by C-G formula based on SCr of 0.74 mg/dL). Liver Function Tests: Recent Labs  Lab 12/26/19 1509 12/27/19 0356  AST 64* 53*  ALT 58* 52*  ALKPHOS 69 64  BILITOT 0.8 0.5  PROT 7.6 7.0  ALBUMIN 3.9 3.4*   No results for input(s): LIPASE, AMYLASE in the last 168 hours. No results for input(s): AMMONIA in the last 168 hours. Coagulation Profile: No results for input(s): INR, PROTIME in the last 168 hours. Cardiac Enzymes: No results for input(s): CKTOTAL, CKMB, CKMBINDEX, TROPONINI in the last 168 hours. BNP (last 3 results) No results for input(s): PROBNP in the last 8760 hours. HbA1C: No results for input(s): HGBA1C in the last 72 hours. CBG: Recent Labs  Lab 12/26/19 2142 12/27/19 0019  12/27/19 0414 12/27/19 0807  GLUCAP 162* 193* 143* 146*   Lipid Profile: No results for input(s): CHOL, HDL, LDLCALC, TRIG, CHOLHDL, LDLDIRECT in the last 72 hours. Thyroid Function Tests: No results for input(s): TSH, T4TOTAL, FREET4, T3FREE, THYROIDAB in the last 72 hours. Anemia Panel: No results for input(s): VITAMINB12, FOLATE, FERRITIN, TIBC, IRON, RETICCTPCT in the last 72 hours.    Radiology Studies: I have reviewed all of the imaging during this hospital visit personally     Scheduled Meds: . amitriptyline  25 mg Oral QHS  . vitamin C  500 mg Oral Daily  . atenolol  50 mg Oral Daily  . DULoxetine  60 mg Oral Daily  .  enoxaparin (LOVENOX) injection  40 mg Subcutaneous Q24H  . hydrochlorothiazide  50 mg Oral Daily  . insulin aspart  0-5 Units Subcutaneous QHS  . insulin aspart  0-9 Units Subcutaneous TID WC  . levothyroxine  125 mcg Oral Daily  . methylPREDNISolone (SOLU-MEDROL) injection  60 mg Intravenous Q12H  . sodium chloride flush  3 mL Intravenous Q12H  . zinc sulfate  220 mg Oral Daily   Continuous Infusions: . remdesivir 100 mg in NS 100 mL 100 mg (12/27/19 1009)     LOS: 1 day        Fiana Gladu Gerome Apley, MD

## 2019-12-27 NOTE — Evaluation (Signed)
Physical Therapy Evaluation Patient Details Name: Rhonda Winters MRN: [id number] DOB: 20-Aug-1945 Today's Date: 12/27/2019   History of Present Illness  Rhonda Winters is a 74 y.o. female with a history of HTN, NIDT2DM, hypothyroidism, chronic back pain who presented to the ED 10/28 with lethargy, confusion, poor oral intake for 4 days. Patient found to be COVID positive.  Clinical Impression  The patient  Is sluggish to move but is able to participate. Patient became  Weak and needed  Recliner to be pulled up when starting to ambulate using RW, got 5'. Patient's SPO2 on RA 84%, on 3 L 94%. Patient should progress to Dc home.  Pt admitted with above diagnosis.  Pt currently with functional limitations due to the deficits listed below (see PT Problem List). Pt will benefit from skilled PT to increase their independence and safety with mobility to allow discharge to the venue listed below.     Follow Up Recommendations Home health PT;Supervision/Assistance - 24 hour    Equipment Recommendations  Rolling walker with 5" wheels    Recommendations for Other Services       Precautions / Restrictions Precautions Precautions: Fall Precaution Comments: monitor sats Restrictions Weight Bearing Restrictions: No      Mobility  Bed Mobility Overal bed mobility: Needs Assistance Bed Mobility: Supine to Sit     Supine to sit: Min assist;HOB elevated     General bed mobility comments: muktimodal cues to initiate activity    Transfers Overall transfer level: Needs assistance Equipment used: Rolling walker (2 wheeled) Transfers: Sit to/from [facility] Sit to Stand: Min assist;+2 safety/equipment Stand pivot transfers: Min assist;+2 safety/equipment       General transfer comment: steady assist to rise  Ambulation/Gait Ambulation/Gait assistance: Min assist;+2 safety/equipment Gait Distance (Feet): 5 Feet Assistive device: Rolling walker (2 wheeled)   Gait  velocity: decr   General Gait Details: started to ambulate stopped and appeared weak, reports feeling sick. Recliner brought up, patient clammy.SPO2 on RA 84%. Replaced on 3 L.  Stairs            Wheelchair Mobility    Modified Rankin (Stroke Patients Only)       Balance Overall balance assessment: Needs assistance Sitting-balance support: Feet supported;No upper extremity supported Sitting balance-Leahy Scale: Good     Standing balance support: During functional activity;Bilateral upper extremity supported Standing balance-Leahy Scale: Poor Standing balance comment: becoming weak and diaphoretic                             Pertinent Vitals/Pain Pain Assessment: No/denies pain    Home Living Family/patient expects to be discharged to:: Private residence Living Arrangements: Spouse/significant other Available Help at Discharge: Family;Available PRN/intermittently Type of Home: House Home Access: Stairs to enter Entrance Stairs-Rails: Right   Home Layout: One level Home Equipment: Shower seat - built in;Walker - 2 wheels      Prior Function Level of Independence: Independent with assistive device(s)         Comments: uses hurry cane.     Hand Dominance        Extremity/Trunk Assessment   Upper Extremity Assessment Upper Extremity Assessment: Generalized weakness    Lower Extremity Assessment Lower Extremity Assessment: Generalized weakness    Cervical / Trunk Assessment Cervical / Trunk Assessment: Normal  Communication   Communication: No difficulties  Cognition   Behavior During Therapy: WFL for tasks assessed/performed  General Comments: Oriented to place, month, misstated the year. Knows Halloween is coming.      General Comments      Exercises Other Exercises Other Exercises: IS x 10   Assessment/Plan    PT Assessment Patient needs continued PT services  PT Problem  List Decreased strength;Decreased knowledge of use of DME;Decreased activity tolerance;Decreased knowledge of precautions;Decreased balance;Decreased mobility;Cardiopulmonary status limiting activity       PT Treatment Interventions DME instruction;Gait training;Functional mobility training;Therapeutic activities;Therapeutic exercise;Patient/family education    PT Goals (Current goals can be found in the Care Plan section)  Acute Rehab PT Goals Patient Stated Goal: Did not state PT Goal Formulation: With patient Time For Goal Achievement: 01/10/20 Potential to Achieve Goals: Good    Frequency Min 3X/week   Barriers to discharge        Co-evaluation PT/OT/SLP Co-Evaluation/Treatment: Yes Reason for Co-Treatment: For patient/therapist safety PT goals addressed during session: Mobility/safety with mobility OT goals addressed during session: ADL's and self-care       AM-PAC PT "6 Clicks" Mobility  Outcome Measure Help needed turning from your back to your side while in a flat bed without using bedrails?: A Little Help needed moving from lying on your back to sitting on the side of a flat bed without using bedrails?: A Little Help needed moving to and from a bed to a chair (including a wheelchair)?: A Little Help needed standing up from a chair using your arms (e.g., wheelchair or bedside chair)?: A Little Help needed to walk in hospital room?: A Lot Help needed climbing 3-5 steps with a railing? : A Lot 6 Click Score: 16    End of Session Equipment Utilized During Treatment: Gait belt;Oxygen Activity Tolerance: Patient limited by fatigue Patient left: in chair;with call bell/phone within reach;with chair alarm set Nurse Communication: Mobility status PT Visit Diagnosis: Unsteadiness on feet (R26.81);Difficulty in walking, not elsewhere classified (R26.2)    Time: 7371-0626 PT Time Calculation (min) (ACUTE ONLY): 46 min   Charges:   PT Evaluation $PT Eval Moderate  Complexity: [place] Pager [phone] Office [phone]   Claretha Cooper 12/27/2019, 1:28 PM

## 2019-12-27 NOTE — TOC Progression Note (Signed)
Transition of Care [facility]) - Progression Note    Patient Details  Name: Rhonda Winters MRN: [id number] Date of Birth: 18-Feb-1946  Transition of Care [facility]) CM/SW Contact  Purcell Mouton, RN Phone Number: 12/27/2019, 1:41 PM  Clinical Narrative:     Pt from home with spouse. TOC will continue to follow.    Expected Discharge Plan: Home/Self Care Barriers to Discharge: No Barriers Identified  Expected Discharge Plan and Services Expected Discharge Plan: Home/Self Care       Living arrangements for the past 2 months: Single Family Home                                       Social Determinants of Health (SDOH) Interventions    Readmission Risk Interventions No flowsheet data found.

## 2019-12-27 NOTE — Progress Notes (Signed)
Initial Nutrition Assessment  DOCUMENTATION CODES:   Obesity unspecified  INTERVENTION:  - will order Hormel Shake with breakfast meals, each supplement provides 500 kcal and 22 grams protein. - will order Magic Cup with dinner meals, each supplement provides 290 kcal and 9 grams of protein. - will Will order 30 ml Prosource Plus BID, each supplement provides 100 kcal and 15 grams protein.  - will order 1 tablet multivitamin with minerals/day. - will complete NFPE when feasible.    NUTRITION DIAGNOSIS:   Increased nutrient needs related to acute illness, catabolic illness ([id number] PNA) as evidenced by estimated needs.  GOAL:   Patient will meet greater than or equal to 90% of their needs  MONITOR:   PO intake, Supplement acceptance, Labs, Weight trends  REASON FOR ASSESSMENT:   Malnutrition Screening Tool, Consult Poor PO  ASSESSMENT:   74 y.o. female with medical history of HTN, type 2 DM, hypothyroidism, and chronic back pain. She presented to the ED on 10/28 due to lethargy, confusion, cough, nausea, and poor oral intake x4 days; also possible fever. Her daughter was recently diagnosed with COVID and has spent time around the patient. The patient is unvaccinated against COVID.  MST score of 2.0. No intakes documented since admission. Unable to reach patient by phone at this time. Weight today is 219 lb and no PTA wt information is available in the chart.  Will trial oral nutrition supplements listed above and make adjustments if needed at follow-up.   Per notes: - COVID PNA - hx of type 2 DM with plan to check [id number] - acute metabolic encephalopathy   Labs reviewed; CBGs: 193, 143, 146, 165 mg/dl, BUN: 24 mg/dl, LFTs slightly elevated.  Medications reviewed; 500 mg ascorbic acid/day, sliding scale novolog, 50 mg hydrodiuril/day, 125 mcg oral synthroid/day, 60 mg solu-medrol BID, 20 mEq Klor-Con x1 dose 10/28, 200 mg IV remdesivir x1 dose 10/28, 100 mg IV remdesivir  x1 dose/day x4 days (10/29-11/1), 220 mg zinc sulfate/day.     NUTRITION - FOCUSED PHYSICAL EXAM:  unable to complete at this time.   Diet Order:   Diet Order            Diet Carb Modified Fluid consistency: Thin; Room service appropriate? Yes  Diet effective now                 EDUCATION NEEDS:   No education needs have been identified at this time  Skin:  Skin Assessment: Reviewed RN Assessment  Last BM:  PTA/unknown  Height:   Ht Readings from Last 1 Encounters:  12/27/19 5\' 5"  (1.651 m)    Weight:   Wt Readings from Last 1 Encounters:  12/27/19 99.6 kg    Estimated Nutritional Needs:  Kcal:  1600-1800 kcal Protein:  80-90 grams Fluid:  >/= 2 L/day      Rhonda Matin, MS, RD, LDN, CNSC Inpatient Clinical Dietitian RD pager # available in [facility]  After hours/weekend pager # available in [facility]

## 2019-12-27 NOTE — Evaluation (Signed)
Occupational Therapy Evaluation Patient Details Name: Rhonda Winters MRN: [id number] DOB: 08-19-1945 Today's Date: 12/27/2019    History of Present Illness Rhonda Winters is a 74 y.o. female with a history of HTN, NIDT2DM, hypothyroidism, chronic back pain who presented to the ED 10/28 with lethargy, confusion, poor oral intake for 4 days. Patient found to be COVID positive.   Clinical Impression   Rhonda Winters is a 74 year old woman who presents on with generalized weakness and decreased activity tolerance resulting in a decline in functional abilities. Patient required min assist and +2 for safety due to knee buckling for standing and transfers. Patient's oxygen dropped to 85% on RA from 3L and then to 81% with a couple of steps. Patient reporting nausea and dizziness with standing. Patient's o2 sat recovered to 91% with [place]. Patient will benefit from skilled OT services while in hospital in order to improve deficits and learn compensatory strategies as needed in order to return to PLOF.      Follow Up Recommendations  Home health OT    Equipment Recommendations  None recommended by OT    Recommendations for Other Services       Precautions / Restrictions Precautions Precautions: Fall Restrictions Weight Bearing Restrictions: No      Mobility Bed Mobility Overal bed mobility: Needs Assistance Bed Mobility: Supine to Sit     Supine to sit: Min assist;HOB elevated          Transfers Overall transfer level: Needs assistance Equipment used: Rolling walker (2 wheeled) Transfers: Sit to/from [facility] Sit to Stand: Min assist;+2 safety/equipment Stand pivot transfers: Min assist;+2 safety/equipment            Balance Overall balance assessment: Mild deficits observed, not formally tested                                         ADL either performed or assessed with clinical judgement   ADL Overall ADL's : Needs  assistance/impaired Eating/Feeding: Set up;Sitting   Grooming: Sitting;Maximal assistance;Brushing hair   Upper Body Bathing: Set up;Moderate assistance;Sitting   Lower Body Bathing: Maximal assistance;Sit to/from stand   Upper Body Dressing : Moderate assistance;Set up;Sitting   Lower Body Dressing: Maximal assistance;Sit to/from stand   Toilet Transfer: Minimal assistance;BSC;Stand-pivot;+2 for safety/equipment Toilet Transfer Details (indicate cue type and reason): Patient exhibits mild knee buckling with standing. Only able to tolerate stand pivot to bsc. +2 for safety. Toileting- Clothing Manipulation and Hygiene: Set up;Min guard;Sit to/from stand               Vision Patient Visual Report: No change from baseline       Perception     Praxis      Pertinent Vitals/Pain Pain Assessment: No/denies pain     Hand Dominance     Extremity/Trunk Assessment Upper Extremity Assessment Upper Extremity Assessment: Generalized weakness   Lower Extremity Assessment Lower Extremity Assessment: Defer to PT evaluation   Cervical / Trunk Assessment Cervical / Trunk Assessment: Normal   Communication     Cognition                                           General Comments       Exercises  Shoulder Instructions      Home Living                                          Prior Functioning/Environment                   OT Problem List: Decreased strength;Decreased activity tolerance;Impaired balance (sitting and/or standing);Decreased knowledge of use of DME or AE;Cardiopulmonary status limiting activity      OT Treatment/Interventions: Self-care/ADL training;Therapeutic exercise;Energy conservation;DME and/or AE instruction;Patient/family education;Therapeutic activities;Balance training    OT Goals(Current goals can be found in the care plan section) Acute Rehab OT Goals Patient Stated Goal: Did not state OT Goal  Formulation: With patient Time For Goal Achievement: 01/10/20 Potential to Achieve Goals: Good  OT Frequency: Min 2X/week   Barriers to D/C:            Co-evaluation PT/OT/SLP Co-Evaluation/Treatment: Yes Reason for Co-Treatment: For patient/therapist safety;To address functional/ADL transfers PT goals addressed during session: Mobility/safety with mobility OT goals addressed during session: ADL's and self-care      AM-PAC OT "6 Clicks" Daily Activity     Outcome Measure Help from another person eating meals?: None Help from another person taking care of personal grooming?: A Little Help from another person toileting, which includes using toliet, bedpan, or urinal?: A Little Help from another person bathing (including washing, rinsing, drying)?: A Lot Help from another person to put on and taking off regular upper body clothing?: A Lot Help from another person to put on and taking off regular lower body clothing?: A Lot 6 Click Score: 16   End of Session Equipment Utilized During Treatment: Gait belt;Rolling walker;Oxygen Nurse Communication: Mobility status  Activity Tolerance: Patient tolerated treatment well Patient left: in chair;with call bell/phone within reach;with chair alarm set  OT Visit Diagnosis: Unsteadiness on feet (R26.81);Muscle weakness (generalized) (M62.81)                Time: 9741-6384 OT Time Calculation (min): 42 min Charges:  OT General Charges $OT Visit: 1 Visit OT Evaluation $OT Eval Moderate Complexity: 1 Mod OT Treatments $Self Care/Home Management : 8-22 mins  Aarish Rockers, OTR/L [place] [phone] Pager: [place] 12/27/2019, 1:03 PM

## 2019-12-28 LAB — GLUCOSE, CAPILLARY
Glucose-Capillary: 136 mg/dL — ABNORMAL HIGH (ref 70–99)
Glucose-Capillary: 165 mg/dL — ABNORMAL HIGH (ref 70–99)
Glucose-Capillary: 171 mg/dL — ABNORMAL HIGH (ref 70–99)
Glucose-Capillary: 173 mg/dL — ABNORMAL HIGH (ref 70–99)

## 2019-12-28 LAB — COMPREHENSIVE METABOLIC PANEL
ALT: 44 U/L (ref 0–44)
AST: 35 U/L (ref 15–41)
Albumin: 3.4 g/dL — ABNORMAL LOW (ref 3.5–5.0)
Alkaline Phosphatase: 62 U/L (ref 38–126)
Anion gap: 13 (ref 5–15)
BUN: 52 mg/dL — ABNORMAL HIGH (ref 8–23)
CO2: 28 mmol/L (ref 22–32)
Calcium: 10.1 mg/dL (ref 8.9–10.3)
Chloride: 102 mmol/L (ref 98–111)
Creatinine, Ser: 0.95 mg/dL (ref 0.44–1.00)
GFR, Estimated: >60 mL/min (ref >60)
Glucose, Bld: 176 mg/dL — ABNORMAL HIGH (ref 70–99)
Potassium: 3.6 mmol/L (ref 3.5–5.1)
Sodium: 143 mmol/L (ref 135–145)
Total Bilirubin: 0.7 mg/dL (ref 0.3–1.2)
Total Protein: 7 g/dL (ref 6.5–8.1)

## 2019-12-28 LAB — C-REACTIVE PROTEIN: CRP: 3.9 mg/dL — ABNORMAL HIGH (ref <1.0)

## 2019-12-28 LAB — D-DIMER, QUANTITATIVE: D-Dimer, Quant: 0.38 ug/mL-FEU (ref 0.00–0.50)

## 2019-12-28 LAB — HEMOGLOBIN A1C
Hgb A1c MFr Bld: 6.6 % — ABNORMAL HIGH (ref 4.8–5.6)
Mean Plasma Glucose: 143 mg/dL

## 2019-12-28 LAB — FERRITIN: Ferritin: 419 ng/mL — ABNORMAL HIGH (ref 11–307)

## 2019-12-28 MED ORDER — POTASSIUM CHLORIDE CRYS ER 20 MEQ PO TBCR
40.0000 meq | EXTENDED_RELEASE_TABLET | Freq: Once | ORAL | Status: AC
  Administered 2019-12-28: 40 meq via ORAL
  Filled 2019-12-28: qty 2

## 2019-12-28 NOTE — Progress Notes (Signed)
PROGRESS NOTE    KAILEIGH VISWANATHAN  VVO:[id number] DOB: December 29, 1945 DOA: 12/26/2019 PCP: Patient, No Pcp Per    Brief Narrative:  [name] admitted to the hospital with a working diagnosis of acute hypoxic respiratory failure due to SARS COVID-19 viral pneumonia.  74 year old female past medical history for hypertension, type II that is mellitus, hypothyroidism and chronic back pain.  Patient reported 4 days of poor oral intake, confusion and lethargy.  Positive fever at home, cough and nausea.  Positive sick contacts at home.  On her initial physical examination she was afebrile, blood pressure 185/109, heart rate 115, respiratory rate 28, oxygen saturation 95% on supplemental oxygen, her lungs had no wheezing, mild Rales at the right base, diffusely diminished breath sounds, heart S1-S2, present rhythmic, soft abdomen, no lower extremity edema. SARS COVID-19 positive.  Chest radiograph with bilateral interstitial infiltrates, right upper lobe and more dense on the left lower lobe.  Patient has been placed on supplemental oxygen per nasal cannula, medical therapy with systemic corticosteroids and remdesivir.    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  Acute hypoxic respiratory failure due to SARS COVID-19 viral pneumonia  RR: 20  Pulse oxymetry: 92%  Fi02: 3 L/min per [place]   COVID-19 Labs  Recent Labs    12/26/19 1509 12/27/19 0356 12/28/19 0440  DDIMER 0.73* 0.65* 0.38  FERRITIN  --   --  419*  LDH 194*  --   --   CRP 6.7* 7.6* 3.9*    Lab Results  Component Value Date   SARSCOV2NAA POSITIVE (A) 12/26/2019     Inflammatory markers and oxygenation is improving.   Continue with  methylprednisolone 60 mg iv q12 and remdesivir #3/5. Tolerating well antitussive agents, bronchodilators and airway clearing techniques.   Continue to encourage out of bed to the chair tid with meals.   Follow on inflammatory markers and oxygenation. Target 02 saturation more than 88%.   Patient will need home health services at discharge.   2. T2DM controlled. Glucose has been controlled, capillary 171, 173, 136, and 165.  Patient is tolerating po well. Discontinue insulin therapy for now.   3. Acute metabolic encephalopathy. It has resolved. Continue with nutritional supplementation.   4. HTN. On atenolol and hctz for blood pressure control.   5. Hypokalemia. This am K down to 3,6 with Na 143 and bicarbonate 28. Renal function stable with cr at 0.95.   Add 46 Kcl today and follow up on mg in am.   6. Chronic pain syndrome/ restless leg syndrome/ depression. Continue with gabapentin, cymbalta and amitriptyline   7. Prolonged Qtc   resolved  8. Hypothyroid. On levothyroxine.    Status is: Inpatient  Remains inpatient appropriate because:IV treatments appropriate due to intensity of illness or inability to take PO   Dispo: The patient is from: Home              Anticipated d/c is to: Home              Anticipated d/c date is: 2 days              Patient currently is not medically stable to d/c.  DVT prophylaxis: Enoxaparin   Code Status:   full  Family Communication:  I spoke over the phone with the patient's husband about patient's  condition, plan of care, prognosis and all questions were addressed. He has tested positive for COVID 19 today.    Nutrition Status: Nutrition Problem: Increased nutrient needs Etiology: acute illness, catabolic illness (OVFIE-33 PNA) Signs/Symptoms: estimated needs Interventions: Hormel Shake, Magic cup, MVI, Other (Comment) (Prosource Plus)       Subjective: Patient is feeling better, but not yet back to baseline, continue  to have dyspnea but no chest pain, no nausea or vomiting.   Objective: Vitals:   12/27/19 1224 12/27/19 2009 12/28/19 0515 12/28/19 0824  BP: (!) 146/79 134/63 (!) 152/73   Pulse: (!) 51 (!) 56 (!) 53   Resp: 16 16 20    Temp: (!) 97.3 F (36.3 C) (!) 97.3 F (36.3 C) (!) 97.3 F (36.3 C)   TempSrc: Oral Axillary Oral   SpO2: 92% 90% 93% 92%  Weight:      Height:        Intake/Output Summary (Last 24 hours) at 12/28/2019 1147 Last data filed at 12/27/2019 2151 Gross per 24 hour  Intake 1090 ml  Output 950 ml  Net 140 ml   Filed Weights   12/27/19 0032  Weight: 99.6 kg    Examination:   General: Not in pain or dyspnea, deconditioned  Neurology: Awake and alert, non focal  E ENT: mild pallor, no icterus, oral mucosa moist Cardiovascular: No JVD. S1-S2 present, rhythmic, no gallops, rubs, or murmurs. No lower extremity edema. Pulmonary: positive breath sounds bilaterally, with no wheezing Gastrointestinal. Abdomen soft and non tender Skin. No rashes Musculoskeletal: no joint deformities     Data Reviewed: I have personally reviewed following labs and imaging studies  CBC: Recent Labs  Lab 12/26/19 1509  WBC 4.5  NEUTROABS 3.4  HGB 13.8  HCT 42.5  MCV 101.2*  PLT 295   Basic Metabolic Panel: Recent Labs  Lab 12/26/19 1509 12/27/19 0356 12/28/19 0440  NA 140 137 143  K 3.4* 3.8 3.6  CL 97* 98 102  CO2 30 27 28   GLUCOSE 120* 171* 176*  BUN 17 24* 52*  CREATININE 0.62 0.74 0.95  CALCIUM 9.9 9.5 10.1   GFR: Estimated Creatinine Clearance: 60.7 mL/min (by C-G formula based on SCr of 0.95 mg/dL). Liver Function Tests: Recent Labs  Lab 12/26/19 1509 12/27/19 0356 12/28/19 0440  AST 64* 53* 35  ALT 58* 52* 44  ALKPHOS 69 64 62  BILITOT 0.8 0.5 0.7  PROT 7.6 7.0 7.0  ALBUMIN 3.9 3.4* 3.4*   No results for input(s): LIPASE, AMYLASE in the last 168 hours. No results for input(s): AMMONIA in the last 168 hours. Coagulation Profile: No results  for input(s): INR, PROTIME in the last 168 hours. Cardiac Enzymes: No results for input(s): CKTOTAL, CKMB, CKMBINDEX, TROPONINI in the last 168 hours. BNP (last 3 results) No results for input(s): PROBNP in the last 8760 hours. HbA1C: Recent Labs    12/27/19 0356  HGBA1C 6.6*   CBG: Recent Labs  Lab 12/27/19 1649 12/27/19 2006 12/28/19 0001 12/28/19 0505 12/28/19 0742  GLUCAP 168* 214* 171* 173* 136*   Lipid Profile: No results for input(s): CHOL, HDL, LDLCALC, TRIG, CHOLHDL, LDLDIRECT in the last 72 hours. Thyroid Function Tests: No results for input(s): TSH, T4TOTAL, FREET4, T3FREE, THYROIDAB in the last 72 hours. Anemia Panel: Recent Labs    12/28/19 0440  FERRITIN 419*  Radiology Studies: I have reviewed all of the imaging during this hospital visit personally     Scheduled Meds: . (feeding supplement) PROSource Plus  30 mL Oral Daily  . amitriptyline  25 mg Oral QHS  . vitamin C  500 mg Oral Daily  . atenolol  50 mg Oral Daily  . chlorpheniramine-HYDROcodone  5 mL Oral Q12H  . DULoxetine  60 mg Oral Daily  . enoxaparin (LOVENOX) injection  40 mg Subcutaneous Q24H  . hydrochlorothiazide  50 mg Oral Daily  . insulin aspart  0-5 Units Subcutaneous QHS  . insulin aspart  0-9 Units Subcutaneous TID WC  . Ipratropium-Albuterol  1 puff Inhalation QID  . levothyroxine  125 mcg Oral Daily  . methylPREDNISolone (SOLU-MEDROL) injection  60 mg Intravenous Q12H  . multivitamin with minerals  1 tablet Oral Daily  . sodium chloride flush  3 mL Intravenous Q12H  . zinc sulfate  220 mg Oral Daily   Continuous Infusions: . remdesivir 100 mg in NS 100 mL 100 mg (12/28/19 0912)     LOS: 2 days        Kennadie Brenner Gerome Apley, MD

## 2019-12-29 DIAGNOSIS — J1282 Pneumonia due to coronavirus disease 2019: Secondary | ICD-10-CM

## 2019-12-29 LAB — COMPREHENSIVE METABOLIC PANEL
ALT: 37 U/L (ref 0–44)
AST: 28 U/L (ref 15–41)
Albumin: 3.4 g/dL — ABNORMAL LOW (ref 3.5–5.0)
Alkaline Phosphatase: 63 U/L (ref 38–126)
Anion gap: 11 (ref 5–15)
BUN: 52 mg/dL — ABNORMAL HIGH (ref 8–23)
CO2: 28 mmol/L (ref 22–32)
Calcium: 9.9 mg/dL (ref 8.9–10.3)
Chloride: 101 mmol/L (ref 98–111)
Creatinine, Ser: 0.84 mg/dL (ref 0.44–1.00)
GFR, Estimated: >60 mL/min (ref >60)
Glucose, Bld: 198 mg/dL — ABNORMAL HIGH (ref 70–99)
Potassium: 3.5 mmol/L (ref 3.5–5.1)
Sodium: 140 mmol/L (ref 135–145)
Total Bilirubin: 0.5 mg/dL (ref 0.3–1.2)
Total Protein: 6.8 g/dL (ref 6.5–8.1)

## 2019-12-29 LAB — D-DIMER, QUANTITATIVE: D-Dimer, Quant: 0.47 ug/mL-FEU (ref 0.00–0.50)

## 2019-12-29 LAB — FERRITIN: Ferritin: 395 ng/mL — ABNORMAL HIGH (ref 11–307)

## 2019-12-29 LAB — C-REACTIVE PROTEIN: CRP: 1.6 mg/dL — ABNORMAL HIGH (ref <1.0)

## 2019-12-29 LAB — MAGNESIUM: Magnesium: 2.2 mg/dL (ref 1.7–2.4)

## 2019-12-29 NOTE — Progress Notes (Addendum)
Patient ID: Rhonda Winters, female   DOB: [birth year], 74 y.o.   MRN: [id number]  PROGRESS NOTE    Rhonda Winters  OIZ:[id number] DOB: 02-Jul-1945 DOA: 12/26/2019 PCP: Patient, No Pcp Per    Brief Narrative:  74 year old female past medical history for hypertension, type II that is mellitus, hypothyroidism and chronic back pain. Patient reported 4 days of poor oral intake, confusion and lethargy. Positive fever at home, cough and nausea. Positive sick contacts at home. On her initial physical examination she was afebrile, blood pressure 185/109, heart rate 115, respiratory rate 28, oxygen saturation 95% on supplemental oxygen, her lungs had no wheezing, mild Rales at the right base, diffusely diminished breath sounds, heart S1-S2, present rhythmic, soft abdomen, no lower extremity edema. SARS COVID-19 positive with positive x-ray findings.   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   Continue with methylprednisolone 60 mg iv q12 and remdesivir #4/5. Tolerating well antitussive agents, bronchodilators and airway clearing techniques.   Continue to encourage out of bed to the chair tid with meals.   Follow on inflammatory markers and oxygenation. Target 02 saturation more than 88%.   Patient will need home health services at discharge.   2. T2DM controlled.  Trend glucose   3. Acute metabolic encephalopathy.  Resolved.   4. HTN.  Continue atenolol and HCTZ  5. Hypokalemia.  Replete and trend   6. Chronic pain  syndrome/ restless leg syndrome/ depression. Continue with gabapentin, cymbalta and amitriptyline  7. Prolonged Qtc  resolved  8. Hypothyroid. On levothyroxine.   DVT prophylaxis: Lovenox SQ Code Status: Full code  Family Communication: Patient at bedside, Husband by phone--he is + for COVID and will get MAB tomorrow. Disposition Plan: Home Patient remains inpatient due to ongoing oxygen requirements, IV medications.   Consultants:   None  Procedures:  None  Antimicrobials: Anti-infectives (From admission, onward)   Start     Dose/Rate Route Frequency Ordered Stop   12/27/19 1000  remdesivir 100 mg in sodium chloride 0.9 % 100 mL IVPB       "Followed by" Linked Group Details   100 mg 200 mL/hr over 30 Minutes Intravenous Daily 12/26/19 1731 12/31/19 0959   12/26/19 1745  remdesivir 200 mg in sodium chloride 0.9% 250 mL IVPB       "Followed by" Linked Group Details   200 mg 580 mL/hr over 30 Minutes Intravenous Once 12/26/19 1731 12/26/19 2050       Subjective: Feels improved.  She is resting comfortably today.  She continues to need oxygen.  Objective: Vitals:   12/29/19 0536 12/29/19 1100 12/29/19 1104 12/29/19 1412  BP: 134/66   (!) 157/79  Pulse: (!) 52   (!) 52  Resp: 18   18  Temp: (!) 97.2 F (36.2 C)   (!) 97.4 F (36.3 C)  TempSrc: Oral   Oral  SpO2: 91% (!) 85% 90% (!) 89%  Weight:      Height:        Intake/Output Summary (Last 24 hours) at  12/29/2019 1508 Last data filed at 12/29/2019 1118 Gross per 24 hour  Intake 600 ml  Output 1250 ml  Net -650 ml   Filed Weights   12/27/19 0032  Weight: 99.6 kg    Examination:  General exam: Appears calm and comfortable  Respiratory system: Clear to auscultation. Respiratory effort normal. Cardiovascular system: S1 & S2 heard, RRR.  Gastrointestinal system: Abdomen is nondistended, soft and nontender.  Central nervous system: Alert and oriented. No focal neurological deficits. Extremities:  Symmetric  Skin: No rashes Psychiatry: Judgement and insight appear normal. Mood & affect appropriate.     Data Reviewed: I have personally reviewed following labs and imaging studies  CBC: Recent Labs  Lab 12/26/19 1509  WBC 4.5  NEUTROABS 3.4  HGB 13.8  HCT 42.5  MCV 101.2*  PLT 924   Basic Metabolic Panel: Recent Labs  Lab 12/26/19 1509 12/27/19 0356 12/28/19 0440 12/29/19 0641  NA 140 137 143 140  K 3.4* 3.8 3.6 3.5  CL 97* 98 102 101  CO2 30 27 28 28   GLUCOSE 120* 171* 176* 198*  BUN 17 24* 52* 52*  CREATININE 0.62 0.74 0.95 0.84  CALCIUM 9.9 9.5 10.1 9.9  MG  --   --   --  2.2   GFR: Estimated Creatinine Clearance: 68.6 mL/min (by C-G formula based on SCr of 0.84 mg/dL). Liver Function Tests: Recent Labs  Lab 12/26/19 1509 12/27/19 0356 12/28/19 0440 12/29/19 0641  AST 64* 53* 35 28  ALT 58* 52* 44 37  ALKPHOS 69 64 62 63  BILITOT 0.8 0.5 0.7 0.5  PROT 7.6 7.0 7.0 6.8  ALBUMIN 3.9 3.4* 3.4* 3.4*   HbA1C: Recent Labs    12/27/19 0356  HGBA1C 6.6*   CBG: Recent Labs  Lab 12/27/19 2006 12/28/19 0001 12/28/19 0505 12/28/19 0742 12/28/19 1204  GLUCAP 214* 171* 173* 136* 165*   Anemia Panel: Recent Labs    12/28/19 0440 12/29/19 0641  FERRITIN 419* 395*   Sepsis Labs: Recent Labs  Lab 12/26/19 1509  PROCALCITON <0.10    Recent Results (from the past 240 hour(s))  Respiratory Panel by RT PCR (Flu A&B, Covid) - Nasopharyngeal Swab     Status: Abnormal   Collection Time: 12/26/19  3:11 PM   Specimen: Nasopharyngeal Swab  Result Value Ref Range Status   SARS Coronavirus 2 by RT PCR POSITIVE (A) NEGATIVE Final    Comment: RESULT CALLED TO, READ BACK BY AND VERIFIED WITH: FRANKLIN,C. RN @1709  ON 10.28.2021 BY COHEN,K (NOTE) SARS-CoV-2 target nucleic acids are DETECTED.  SARS-CoV-2 RNA is generally detectable in upper respiratory specimens  during the acute phase of infection. Positive results are indicative of the presence of  the identified virus, but do not rule out bacterial infection or co-infection with other pathogens not detected by the test. Clinical correlation with patient history and other diagnostic information is necessary to determine patient infection status. The expected result is Negative.  Fact Sheet for Patients:  PinkCheek.be  Fact Sheet for Healthcare Providers: GravelBags.it  This test is not yet approved or cleared by the [place] FDA and  has been authorized for detection and/or diagnosis of SARS-CoV-2 by FDA under an Emergency Use Authorization (EUA).  This EUA will remain in effect (meaning this t est can be used) for the duration of  the COVID-19 declaration under Section 564(b)(1) of the Act, 21 U.S.C. section 360bbb-3(b)(1), unless the authorization is terminated or revoked sooner.      Influenza A  by PCR NEGATIVE NEGATIVE Final   Influenza B by PCR NEGATIVE NEGATIVE Final    Comment: (NOTE) The Xpert Xpress SARS-CoV-2/FLU/RSV assay is intended as an aid in  the diagnosis of influenza from Nasopharyngeal swab specimens and  should not be used as a sole basis for treatment. Nasal washings and  aspirates are unacceptable for Xpert Xpress SARS-CoV-2/FLU/RSV  testing.  Fact Sheet for Patients: PinkCheek.be  Fact Sheet for Healthcare Providers: GravelBags.it  This test is not yet approved or cleared by the [place] FDA and  has been authorized for detection and/or diagnosis of SARS-CoV-2 by  FDA under an Emergency Use Authorization (EUA). This EUA will remain  in effect (meaning this test can be used) for the duration of the  Covid-19 declaration under Section 564(b)(1) of the Act, 21  U.S.C. section 360bbb-3(b)(1), unless the authorization is  terminated or revoked. Performed at [facility], [place] [address], [place], [place]  [address]   Respiratory Panel by PCR     Status: None   Collection Time: 12/26/19  3:44 PM  Result Value Ref Range Status   Adenovirus NOT DETECTED NOT DETECTED Final   Coronavirus 229E NOT DETECTED NOT DETECTED Final    Comment: (NOTE) The Coronavirus on the Respiratory Panel, DOES NOT test for the novel  Coronavirus (2019 nCoV)    Coronavirus HKU1 NOT DETECTED NOT DETECTED Final   Coronavirus NL63 NOT DETECTED NOT DETECTED Final   Coronavirus OC43 NOT DETECTED NOT DETECTED Final   Metapneumovirus NOT DETECTED NOT DETECTED Final   Rhinovirus / Enterovirus NOT DETECTED NOT DETECTED Final   Influenza A NOT DETECTED NOT DETECTED Final   Influenza B NOT DETECTED NOT DETECTED Final   Parainfluenza Virus 1 NOT DETECTED NOT DETECTED Final   Parainfluenza Virus 2 NOT DETECTED NOT DETECTED Final   Parainfluenza Virus 3 NOT DETECTED NOT DETECTED Final   Parainfluenza Virus 4 NOT DETECTED NOT DETECTED Final   Respiratory Syncytial Virus NOT DETECTED NOT DETECTED Final   Bordetella pertussis NOT DETECTED NOT DETECTED Final   Chlamydophila pneumoniae NOT DETECTED NOT DETECTED Final   Mycoplasma pneumoniae NOT DETECTED NOT DETECTED Final    Comment: Performed at [facility], [place]. [address] [place], [place] [address]      Radiology Studies: No results found.   Scheduled Meds: . (feeding supplement) PROSource Plus  30 mL Oral Daily  . amitriptyline  25 mg Oral QHS  . vitamin C  500 mg Oral Daily  . atenolol  50 mg Oral Daily  . chlorpheniramine-HYDROcodone  5 mL Oral Q12H  . DULoxetine  60 mg Oral Daily  . enoxaparin (LOVENOX) injection  40 mg Subcutaneous Q24H  . hydrochlorothiazide  50 mg Oral Daily  . Ipratropium-Albuterol  1 puff Inhalation QID  . levothyroxine  125 mcg Oral Daily  . methylPREDNISolone (SOLU-MEDROL) injection  60 mg Intravenous Q12H  . multivitamin with minerals  1 tablet Oral Daily  . sodium chloride flush  3 mL Intravenous Q12H  . zinc sulfate  220 mg Oral  Daily   Continuous Infusions: . remdesivir 100 mg in NS 100 mL 100 mg (12/29/19 1057)     LOS: 3 days    Donnamae Jude, MD 12/29/2019 3:08 PM [phone] [facility] If 7PM-7AM, please contact night-coverage 12/29/2019, 3:08 PM

## 2019-12-30 LAB — COMPREHENSIVE METABOLIC PANEL
ALT: 44 U/L (ref 0–44)
AST: 28 U/L (ref 15–41)
Albumin: 3.3 g/dL — ABNORMAL LOW (ref 3.5–5.0)
Alkaline Phosphatase: 57 U/L (ref 38–126)
Anion gap: 12 (ref 5–15)
BUN: 46 mg/dL — ABNORMAL HIGH (ref 8–23)
CO2: 26 mmol/L (ref 22–32)
Calcium: 10 mg/dL (ref 8.9–10.3)
Chloride: 101 mmol/L (ref 98–111)
Creatinine, Ser: 0.9 mg/dL (ref 0.44–1.00)
GFR, Estimated: >60 mL/min (ref >60)
Glucose, Bld: 218 mg/dL — ABNORMAL HIGH (ref 70–99)
Potassium: 3.2 mmol/L — ABNORMAL LOW (ref 3.5–5.1)
Sodium: 139 mmol/L (ref 135–145)
Total Bilirubin: 0.8 mg/dL (ref 0.3–1.2)
Total Protein: 6.6 g/dL (ref 6.5–8.1)

## 2019-12-30 LAB — FERRITIN: Ferritin: 384 ng/mL — ABNORMAL HIGH (ref 11–307)

## 2019-12-30 LAB — D-DIMER, QUANTITATIVE: D-Dimer, Quant: 0.46 ug/mL-FEU (ref 0.00–0.50)

## 2019-12-30 LAB — C-REACTIVE PROTEIN: CRP: 0.9 mg/dL (ref <1.0)

## 2019-12-30 MED ORDER — ENOXAPARIN SODIUM 60 MG/0.6ML ~~LOC~~ SOLN: 50.0000 mg | SUBCUTANEOUS | Status: DC

## 2019-12-30 MED ORDER — POTASSIUM CHLORIDE CRYS ER 20 MEQ PO TBCR
40.0000 meq | EXTENDED_RELEASE_TABLET | Freq: Once | ORAL | Status: AC
  Administered 2019-12-30: 40 meq via ORAL
  Filled 2019-12-30: qty 2

## 2019-12-30 MED ORDER — GUAIFENESIN-DM 100-10 MG/5ML PO SYRP: 10.0000 mL | ORAL_SOLUTION | ORAL | 0 refills | Status: DC | PRN

## 2019-12-30 MED ORDER — ZINC SULFATE 220 (50 ZN) MG PO CAPS
220.0000 mg | ORAL_CAPSULE | Freq: Every day | ORAL | 0 refills | Status: DC
Start: 2019-12-31 — End: 2020-06-29

## 2019-12-30 MED ORDER — ALBUTEROL SULFATE HFA 108 (90 BASE) MCG/ACT IN AERS: 1.0000 | INHALATION_SPRAY | RESPIRATORY_TRACT | 0 refills | Status: DC | PRN

## 2019-12-30 MED ORDER — ASCORBIC ACID 500 MG PO TABS
500.0000 mg | ORAL_TABLET | Freq: Every day | ORAL | 0 refills | Status: DC
Start: 2019-12-31 — End: 2021-02-12

## 2019-12-30 MED ORDER — PREDNISONE 20 MG PO TABS: 40.0000 mg | ORAL_TABLET | Freq: Every day | ORAL | 0 refills | Status: AC

## 2019-12-30 MED ORDER — PANTOPRAZOLE SODIUM 40 MG PO TBEC: 40.0000 mg | DELAYED_RELEASE_TABLET | Freq: Every day | ORAL | 0 refills | Status: DC

## 2019-12-30 NOTE — Discharge Summary (Signed)
Physician Discharge Summary  Rhonda Winters BHA:[id number] DOB: [birth year] DOA: 12/26/2019  PCP: Patient, No Pcp Per  Admit date: 12/26/2019 Discharge date: 12/30/2019  Admitted From: Home  Discharge disposition: Home Health   Recommendations for Outpatient Follow-Up:   . Follow up with your primary care provider in 1-2 weeks . 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  Diet recommendation:   Carbohydrate-modified.   Wound care: None.  Code status: Full.   History of Present Illness:   74 year old female past medical history for hypertension, type II Diabetes mellitus, hypothyroidism and chronic back pain presented to hospital with poor oral intake, confusion and lethargy.  See was noted to have fever at home with cough and nausea.  Initially, she was afebrile, blood pressure 185/109, heart rate 115, respiratory rate 28, oxygen saturation 95% on supplemental oxygen, her lungs had no wheezing, mild rales at the right base, diffusely diminished breath sounds, heart S1-S2, present rhythmic, soft abdomen, no lower extremity edema. SARS COVID-19 positive with positive x-ray findings.  Patient was then admitted to hospital for evaluation and treatment.   Hospital Course:   Following conditions were addressed during hospitalization as listed below,  Acute hypoxic respiratory failure due to SARS COVID-19 viral pneumonia  Continue Solu-Medrol, remdesivir. Continue supportive care including bronchodilators.  Patient will need home health on discharge.  Patient did not have oxygen requirement on discharge.    Type 2 diabetes  mellitus Diet controlled. Continue metformin at home.    Acute metabolic encephalopathy.  improved.   Essential HTN.  On atenolol and HCTZ.  Blood pressure 145/ 76 prior to discharge.     Hypokalemia.  Continue to replenish.  Potassium 3.2 today. PO KCL given   Chronic pain syndrome/ restless leg syndrome/ depression. Continue with gabapentin, cymbalta and amitriptyline on discharge.    Prolonged Qtc   resolved.      Hypothyroidism.  Continue Synthroid   Disposition.  At this time, patient is stable for disposition home.   Medical Consultants:    None.  Procedures:    None Subjective:   Today, patient was seen and examined at bedside.  Patient denies any chest pain, fever, chills or shortness of breath.  Wishes to go home  Discharge Exam:   Vitals:   12/30/19 1205 12/30/19 1259  BP: (!) 141/89   Pulse: (!) 54   Resp: 20   Temp: 97.7 F (36.5 C)   SpO2: 90% 93%   Vitals:   12/29/19 1412 12/29/19 2124 12/30/19 1205 12/30/19 1259  BP: (!) 157/79 (!) 145/76 (!) 141/89   Pulse: (!) 52 (!) 54 (!) 54   Resp: 18 18 20    Temp: (!) 97.4 F (36.3 C) 97.8 F (36.6 C) 97.7 F (36.5 C)   TempSrc: Oral Oral Oral   SpO2: (!) 89% 90% 90% 93%  Weight:      Height:       General: Alert awake, not in obvious distress HENT: pupils equally reacting to light,  No scleral pallor or icterus noted. Oral mucosa is moist.  Chest:    Diminished breath sounds bilaterally. No crackles or wheezes.  CVS: S1 &S2 heard. No murmur.  Regular rate and rhythm. Abdomen: Soft, nontender, nondistended.  Bowel sounds are heard.   Extremities: No cyanosis, clubbing or edema.  Peripheral pulses are palpable. Psych: Alert, awake and oriented, normal mood CNS:  No cranial nerve deficits.  Power equal in all extremities.   Skin: Warm and dry.  No rashes noted.  The results of significant diagnostics from this hospitalization (including imaging, microbiology, ancillary and laboratory) are listed  below for reference.     Diagnostic Studies:   Portable chest 1 View  Result Date: 12/26/2019 CLINICAL DATA:  Concern for possible COVID infection. Chills, cough, headache. EXAM: PORTABLE CHEST 1 VIEW COMPARISON:  None FINDINGS: Lungs are suboptimally inflated. Cardiomediastinal contours are unremarkable. Aortic atherosclerotic calcifications noted. Airspace densities identified within the right upper lobe and left lung base. Mildly increased interstitial markings noted. Visualized osseous structures unremarkable. IMPRESSION: 1. Right upper lobe and left lung base airspace opacities compatible with multifocal infection. 2. Aortic atherosclerotic disease. Electronically Signed   By: Kerby Moors M.D.   On: 12/26/2019 16:06     Labs:   Basic Metabolic Panel: Recent Labs  Lab 12/26/19 1509 12/26/19 1509 12/27/19 0356 12/27/19 0356 12/28/19 0440 12/28/19 0440 12/29/19 0641 12/30/19 0408  NA 140  --  137  --  143  --  140 139  K 3.4*   < > 3.8   < > 3.6   < > 3.5 3.2*  CL 97*  --  98  --  102  --  101 101  CO2 30  --  27  --  28  --  28 26  GLUCOSE 120*  --  171*  --  176*  --  198* 218*  BUN 17  --  24*  --  52*  --  52* 46*  CREATININE 0.62  --  0.74  --  0.95  --  0.84 0.90  CALCIUM 9.9  --  9.5  --  10.1  --  9.9 10.0  MG  --   --   --   --   --   --  2.2  --    < > = values in this interval not displayed.   GFR Estimated Creatinine Clearance: 64.1 mL/min (by C-G formula based on SCr of 0.9 mg/dL). Liver Function Tests: Recent Labs  Lab 12/26/19 1509 12/27/19 0356 12/28/19 0440 12/29/19 0641 12/30/19 0408  AST 64* 53* 35 28 28  ALT 58* 52* 44 37 44  ALKPHOS 69 64 62 63 57  BILITOT 0.8 0.5 0.7 0.5 0.8  PROT 7.6 7.0 7.0 6.8 6.6  ALBUMIN 3.9 3.4* 3.4* 3.4* 3.3*   No results for input(s): LIPASE, AMYLASE in the last 168 hours. No results for input(s): AMMONIA in the last 168 hours. Coagulation profile No results for input(s): INR, PROTIME in the last 168  hours.  CBC: Recent Labs  Lab 12/26/19 1509  WBC 4.5  NEUTROABS 3.4  HGB 13.8  HCT 42.5  MCV 101.2*  PLT 164   Cardiac Enzymes: No results for input(s): CKTOTAL, CKMB, CKMBINDEX, TROPONINI in the last 168 hours. BNP: Invalid input(s): POCBNP CBG: Recent Labs  Lab 12/27/19 2006 12/28/19 0001 12/28/19 0505 12/28/19 0742 12/28/19 1204  GLUCAP 214* 171* 173* 136* 165*   D-Dimer Recent Labs    12/29/19 0641 12/30/19 0408  DDIMER 0.47 0.46   Hgb A1c No results for input(s): HGBA1C in the last 72 hours. Lipid Profile No results for input(s): CHOL,  HDL, LDLCALC, TRIG, CHOLHDL, LDLDIRECT in the last 72 hours. Thyroid function studies No results for input(s): TSH, T4TOTAL, T3FREE, THYROIDAB in the last 72 hours.  Invalid input(s): FREET3 Anemia work up Recent Labs    12/29/19 0641 12/30/19 0408  FERRITIN 395* 384*   Microbiology Recent Results (from the past 240 hour(s))  Respiratory Panel by RT PCR (Flu A&B, Covid) - Nasopharyngeal Swab     Status: Abnormal   Collection Time: 12/26/19  3:11 PM   Specimen: Nasopharyngeal Swab  Result Value Ref Range Status   SARS Coronavirus 2 by RT PCR POSITIVE (A) NEGATIVE Final    Comment: RESULT CALLED TO, READ BACK BY AND VERIFIED WITH: FRANKLIN,C. RN @1709  ON 10.28.2021 BY COHEN,K (NOTE) SARS-CoV-2 target nucleic acids are DETECTED.  SARS-CoV-2 RNA is generally detectable in upper respiratory specimens  during the acute phase of infection. Positive results are indicative of the presence of the identified virus, but do not rule out bacterial infection or co-infection with other pathogens not detected by the test. Clinical correlation with patient history and other diagnostic information is necessary to determine patient infection status. The expected result is Negative.  Fact Sheet for Patients:  PinkCheek.be  Fact Sheet for Healthcare  Providers: GravelBags.it  This test is not yet approved or cleared by the [place] FDA and  has been authorized for detection and/or diagnosis of SARS-CoV-2 by FDA under an Emergency Use Authorization (EUA).  This EUA will remain in effect (meaning this t est can be used) for the duration of  the COVID-19 declaration under Section 564(b)(1) of the Act, 21 U.S.C. section 360bbb-3(b)(1), unless the authorization is terminated or revoked sooner.      Influenza A by PCR NEGATIVE NEGATIVE Final   Influenza B by PCR NEGATIVE NEGATIVE Final    Comment: (NOTE) The Xpert Xpress SARS-CoV-2/FLU/RSV assay is intended as an aid in  the diagnosis of influenza from Nasopharyngeal swab specimens and  should not be used as a sole basis for treatment. Nasal washings and  aspirates are unacceptable for Xpert Xpress SARS-CoV-2/FLU/RSV  testing.  Fact Sheet for Patients: PinkCheek.be  Fact Sheet for Healthcare Providers: GravelBags.it  This test is not yet approved or cleared by the [place] FDA and  has been authorized for detection and/or diagnosis of SARS-CoV-2 by  FDA under an Emergency Use Authorization (EUA). This EUA will remain  in effect (meaning this test can be used) for the duration of the  Covid-19 declaration under Section 564(b)(1) of the Act, 21  U.S.C. section 360bbb-3(b)(1), unless the authorization is  terminated or revoked. Performed at [facility], [place] [address], [place], [place] [address]   Respiratory Panel by PCR     Status: None   Collection Time: 12/26/19  3:44 PM  Result Value Ref Range Status   Adenovirus NOT DETECTED NOT DETECTED Final   Coronavirus 229E NOT DETECTED NOT DETECTED Final    Comment: (NOTE) The Coronavirus on the Respiratory Panel, DOES NOT test for the novel  Coronavirus (2019 nCoV)    Coronavirus HKU1 NOT DETECTED NOT DETECTED Final    Coronavirus NL63 NOT DETECTED NOT DETECTED Final   Coronavirus OC43 NOT DETECTED NOT DETECTED Final   Metapneumovirus NOT DETECTED NOT DETECTED Final   Rhinovirus / Enterovirus NOT DETECTED NOT DETECTED Final   Influenza A NOT DETECTED NOT DETECTED Final   Influenza B NOT DETECTED NOT DETECTED Final   Parainfluenza Virus 1 NOT DETECTED NOT DETECTED Final   Parainfluenza Virus  2 NOT DETECTED NOT DETECTED Final   Parainfluenza Virus 3 NOT DETECTED NOT DETECTED Final   Parainfluenza Virus 4 NOT DETECTED NOT DETECTED Final   Respiratory Syncytial Virus NOT DETECTED NOT DETECTED Final   Bordetella pertussis NOT DETECTED NOT DETECTED Final   Chlamydophila pneumoniae NOT DETECTED NOT DETECTED Final   Mycoplasma pneumoniae NOT DETECTED NOT DETECTED Final    Comment: Performed at [facility], [place] [address], [place], [place] [address]     Discharge Instructions:   Discharge Instructions     Call MD for:  difficulty breathing, headache or visual disturbances   Complete by: As directed    Call MD for:  temperature >100.4   Complete by: As directed    Diet Carb Modified   Complete by: As directed    Discharge instructions   Complete by: As directed    Total isolation duration 21 days from day of diagnosis. Ok to use over the counter cough medication, tylenol at home as needed. Follow up with your primary care physician in one week.   Increase activity slowly   Complete by: As directed       Allergies as of 12/30/2019   No Known Allergies      Medication List     TAKE these medications    acetaminophen 500 MG tablet Commonly known as: TYLENOL Take 1,000 mg by mouth every 6 (six) hours as needed for moderate pain.   albuterol 108 (90 Base) MCG/ACT inhaler Commonly known as: VENTOLIN HFA Inhale 1 puff into the lungs every 4 (four) hours as needed for shortness of breath.   amitriptyline 25 MG tablet Commonly known as: ELAVIL Take 25 mg by mouth at bedtime.    ascorbic acid 500 MG tablet Commonly known as: VITAMIN C Take 1 tablet (500 mg total) by mouth daily. Start taking on: December 31, 2019   atenolol 50 MG tablet Commonly known as: TENORMIN Take 50 mg by mouth daily.   diclofenac 75 MG EC tablet Commonly known as: VOLTAREN Take 75 mg by mouth 2 (two) times daily.   DULoxetine 60 MG capsule Commonly known as: CYMBALTA Take 60 mg by mouth daily.   Euthyrox 125 MCG tablet Generic drug: levothyroxine Take 125 mcg by mouth daily.   furosemide 20 MG tablet Commonly known as: LASIX Take 20 mg by mouth daily as needed for fluid.   gabapentin 800 MG tablet Commonly known as: NEURONTIN Take 800 mg by mouth 2 (two) times daily.   guaiFENesin-dextromethorphan 100-10 MG/5ML syrup Commonly known as: ROBITUSSIN DM Take 10 mLs by mouth every 4 (four) hours as needed for cough.   hydrochlorothiazide 50 MG tablet Commonly known as: HYDRODIURIL Take 50 mg by mouth daily.   metFORMIN 500 MG 24 hr tablet Commonly known as: GLUCOPHAGE-XR Take 750 mg by mouth daily.   pantoprazole 40 MG tablet Commonly known as: Protonix Take 1 tablet (40 mg total) by mouth daily for 15 days.   predniSONE 20 MG tablet Commonly known as: DELTASONE Take 2 tablets (40 mg total) by mouth daily with breakfast for 4 days.   zinc sulfate 220 (50 Zn) MG capsule Take 1 capsule (220 mg total) by mouth daily. Start taking on: December 31, 2019           Time coordinating discharge: 39 minutes  Signed:  Danen Lapaglia  [facility] 12/30/2019, 1:49 PM

## 2019-12-30 NOTE — Care Management Important Message (Signed)
Important Message  Patient Details IM Letter given to the Patient Name: Rhonda Winters MRN: [id number] Date of Birth: 1946/01/12   Medicare Important Message Given:  Yes     Kerin Salen 12/30/2019, 10:02 AM

## 2019-12-30 NOTE — Progress Notes (Signed)
Physical Therapy Treatment Patient Details Name: Rhonda Winters MRN: [id number] DOB: 1945/08/03 Today's Date: 12/30/2019    History of Present Illness Rhonda Winters is a 74 y.o. female with a history of HTN, NIDT2DM, hypothyroidism, chronic back pain who presented to the ED 10/28 with lethargy, confusion, poor oral intake for 4 days. Patient found to be COVID positive.    PT Comments    Patient is much improved in functional mobility. Patient ambulated in Room on RA with SPO2 > 91%. Recommend HHPT.   Follow Up Recommendations  Home health PT;Supervision/Assistance - 24 hour     Equipment Recommendations  None recommended by PT    Recommendations for Other Services       Precautions / Restrictions Precautions Precautions: Fall Precaution Comments: monitor sats    Mobility  Bed Mobility Overal bed mobility: Needs Assistance Bed Mobility: Supine to Sit     Supine to sit: Supervision     General bed mobility comments: no assistance required  Transfers Overall transfer level: Needs assistance Equipment used: Rolling walker (2 wheeled) Transfers: Stand Pivot Transfers Sit to Stand: Supervision            Ambulation/Gait Ambulation/Gait assistance: Supervision Gait Distance (Feet): 20 Feet (then 50') Assistive device: Rolling walker (2 wheeled) Gait Pattern/deviations: Step-through pattern     General Gait Details: managed turns and maneuvre in room.   Stairs             Wheelchair Mobility    Modified Rankin (Stroke Patients Only)       Balance     Sitting balance-Leahy Scale: Good     Standing balance support: No upper extremity supported;During functional activity Standing balance-Leahy Scale: Fair Standing balance comment: propped at sink, steady amb with RW                            Cognition Arousal/Alertness: Awake/alert Behavior During Therapy: WFL for tasks assessed/performed                                    General Comments: MS clear today.eager to go home      Exercises      General Comments        Pertinent Vitals/Pain Pain Assessment: No/denies pain    Home Living                      Prior Function            PT Goals (current goals can now be found in the care plan section) Progress towards PT goals: Progressing toward goals    Frequency    Min 3X/week      PT Plan Current plan remains appropriate    Co-evaluation              AM-PAC PT "6 Clicks" Mobility   Outcome Measure  Help needed turning from your back to your side while in a flat bed without using bedrails?: None Help needed moving from lying on your back to sitting on the side of a flat bed without using bedrails?: None Help needed moving to and from a bed to a chair (including a wheelchair)?: A Little Help needed standing up from a chair using your arms (e.g., wheelchair or bedside chair)?: A Little Help needed to walk in hospital room?: A Little Help needed climbing  3-5 steps with a railing? : A Little 6 Click Score: 20    End of Session Equipment Utilized During Treatment: Gait belt Activity Tolerance: Patient tolerated treatment well Patient left: in chair;with call bell/phone within reach;with chair alarm set Nurse Communication: Mobility status PT Visit Diagnosis: Unsteadiness on feet (R26.81);Difficulty in walking, not elsewhere classified (R26.2)     Time: 8948-3475 PT Time Calculation (min) (ACUTE ONLY): 40 min  Charges:  $Gait Training: 8-22 mins $Self Care/Home Management: [place] Pager [phone] Office [phone]    Claretha Cooper 12/30/2019, 11:29 AM

## 2019-12-30 NOTE — TOC Transition Note (Signed)
Transition of Care [facility]) - CM/SW Discharge Note   Patient Details  Name: Rhonda Winters MRN: [id number] Date of Birth: [birth year]  Transition of [facility]) CM/SW Contact:  Ross Ludwig, LCSW Phone Number: 12/30/2019, 5:30 PM   Clinical Narrative:     Patient will be going home with home health RN through [place].  CSW signing off please reconsult with any other social work needs, home health agency has been notified of planned discharge.  Patient to notify her family member about returning back home.  Patient states she does not need any equipment.    Final next level of care: [place] Barriers to Discharge: Barriers Resolved   Patient Goals and CMS Choice Patient states their goals for this hospitalization and ongoing recovery are:: To return back home with home health services. CMS Medicare.gov Compare Post Acute Care list provided to:: Patient Choice offered to / list presented to : Patient  Discharge Placement  Patient going home with home health.                     Discharge Plan and Services                  DME Agency: NA       HH Arranged: PT HH Agency: [place] Date HH Agency Contacted: 12/30/19 Time HH Agency Contacted: 1400 Representative spoke with at [place]: [place] Determinants of Health ([place]) Interventions     Readmission Risk Interventions No flowsheet data found.

## 2019-12-30 NOTE — Discharge Instructions (Signed)
COVID-19 COVID-19 is a respiratory infection that is caused by a virus called severe acute respiratory syndrome coronavirus 2 (SARS-CoV-2). The disease is also known as coronavirus disease or novel coronavirus. In some people, the virus may not cause any symptoms. In others, it may cause a serious infection. The infection can get worse quickly and can lead to complications, such as:  Pneumonia, or infection of the lungs.  Acute respiratory distress syndrome or ARDS. This is a condition in which fluid build-up in the lungs prevents the lungs from filling with air and passing oxygen into the blood.  Acute respiratory failure. This is a condition in which there is not enough oxygen passing from the lungs to the body or when carbon dioxide is not passing from the lungs out of the body.  Sepsis or septic shock. This is a serious bodily reaction to an infection.  Blood clotting problems.  Secondary infections due to bacteria or fungus.  Organ failure. This is when your body's organs stop working. The virus that causes COVID-19 is contagious. This means that it can spread from person to person through droplets from coughs and sneezes (respiratory secretions). What are the causes? This illness is caused by a virus. You may catch the virus by:  Breathing in droplets from an infected person. Droplets can be spread by a person breathing, speaking, singing, coughing, or sneezing.  Touching something, like a table or a doorknob, that was exposed to the virus (contaminated) and then touching your mouth, nose, or eyes. What increases the risk? Risk for infection You are more likely to be infected with this virus if you:  Are within 6 feet (2 meters) of a person with COVID-19.  Provide care for or live with a person who is infected with COVID-19.  Spend time in crowded indoor spaces or live in shared housing. Risk for serious illness You are more likely to become seriously ill from the virus if you:   Are 50 years of age or older. The higher your age, the more you are at risk for serious illness.  Live in a nursing home or long-term care facility.  Have cancer.  Have a long-term (chronic) disease such as: ? Chronic lung disease, including chronic obstructive pulmonary disease or asthma. ? A long-term disease that lowers your body's ability to fight infection (immunocompromised). ? Heart disease, including heart failure, a condition in which the arteries that lead to the heart become narrow or blocked (coronary artery disease), a disease which makes the heart muscle thick, weak, or stiff (cardiomyopathy). ? Diabetes. ? Chronic kidney disease. ? Sickle cell disease, a condition in which red blood cells have an abnormal "sickle" shape. ? Liver disease.  Are obese. What are the signs or symptoms? Symptoms of this condition can range from mild to severe. Symptoms may appear any time from 2 to 14 days after being exposed to the virus. They include:  A fever or chills.  A cough.  Difficulty breathing.  Headaches, body aches, or muscle aches.  Runny or stuffy (congested) nose.  A sore throat.  New loss of taste or smell. Some people may also have stomach problems, such as nausea, vomiting, or diarrhea. Other people may not have any symptoms of COVID-19. How is this diagnosed? This condition may be diagnosed based on:  Your signs and symptoms, especially if: ? You live in an area with a COVID-19 outbreak. ? You recently traveled to or from an area where the virus is common. ? You   provide care for or live with a person who was diagnosed with COVID-19. ? You were exposed to a person who was diagnosed with COVID-19.  A physical exam.  Lab tests, which may include: ? Taking a sample of fluid from the back of your nose and throat (nasopharyngeal fluid), your nose, or your throat using a swab. ? A sample of mucus from your lungs (sputum). ? Blood tests.  Imaging tests, which  may include, X-rays, CT scan, or ultrasound. How is this treated? At present, there is no medicine to treat COVID-19. Medicines that treat other diseases are being used on a trial basis to see if they are effective against COVID-19. Your health care provider will talk with you about ways to treat your symptoms. For most people, the infection is mild and can be managed at home with rest, fluids, and over-the-counter medicines. Treatment for a serious infection usually takes places in a hospital intensive care unit (ICU). It may include one or more of the following treatments. These treatments are given until your symptoms improve.  Receiving fluids and medicines through an IV.  Supplemental oxygen. Extra oxygen is given through a tube in the nose, a face mask, or a hood.  Positioning you to lie on your stomach (prone position). This makes it easier for oxygen to get into the lungs.  Continuous positive airway pressure (CPAP) or bi-level positive airway pressure (BPAP) machine. This treatment uses mild air pressure to keep the airways open. A tube that is connected to a motor delivers oxygen to the body.  Ventilator. This treatment moves air into and out of the lungs by using a tube that is placed in your windpipe.  Tracheostomy. This is a procedure to create a hole in the neck so that a breathing tube can be inserted.  Extracorporeal membrane oxygenation (ECMO). This procedure gives the lungs a chance to recover by taking over the functions of the heart and lungs. It supplies oxygen to the body and removes carbon dioxide. Follow these instructions at home: Lifestyle  If you are sick, stay home except to get medical care. Your health care provider will tell you how long to stay home. Call your health care provider before you go for medical care.  Rest at home as told by your health care provider.  Do not use any products that contain nicotine or tobacco, such as cigarettes, e-cigarettes, and  chewing tobacco. If you need help quitting, ask your health care provider.  Return to your normal activities as told by your health care provider. Ask your health care provider what activities are safe for you. General instructions  Take over-the-counter and prescription medicines only as told by your health care provider.  Drink enough fluid to keep your urine pale yellow.  Keep all follow-up visits as told by your health care provider. This is important. How is this prevented?  There is no vaccine to help prevent COVID-19 infection. However, there are steps you can take to protect yourself and others from this virus. To protect yourself:   Do not travel to areas where COVID-19 is a risk. The areas where COVID-19 is reported change often. To identify high-risk areas and travel restrictions, check the CDC travel website: wwwnc.cdc.gov/travel/notices  If you live in, or must travel to, an area where COVID-19 is a risk, take precautions to avoid infection. ? Stay away from people who are sick. ? Wash your hands often with soap and water for 20 seconds. If soap and water   are not available, use an alcohol-based hand sanitizer. ? Avoid touching your mouth, face, eyes, or nose. ? Avoid going out in public, follow guidance from your state and local health authorities. ? If you must go out in public, wear a cloth face covering or face mask. Make sure your mask covers your nose and mouth. ? Avoid crowded indoor spaces. Stay at least 6 feet (2 meters) away from others. ? Disinfect objects and surfaces that are frequently touched every day. This may include:  Counters and tables.  Doorknobs and light switches.  Sinks and faucets.  Electronics, such as phones, remote controls, keyboards, computers, and tablets. To protect others: If you have symptoms of COVID-19, take steps to prevent the virus from spreading to others.  If you think you have a COVID-19 infection, contact your health care  provider right away. Tell your health care team that you think you may have a COVID-19 infection.  Stay home. Leave your house only to seek medical care. Do not use public transport.  Do not travel while you are sick.  Wash your hands often with soap and water for 20 seconds. If soap and water are not available, use alcohol-based hand sanitizer.  Stay away from other members of your household. Let healthy household members care for children and pets, if possible. If you have to care for children or pets, wash your hands often and wear a mask. If possible, stay in your own room, separate from others. Use a different bathroom.  Make sure that all people in your household wash their hands well and often.  Cough or sneeze into a tissue or your sleeve or elbow. Do not cough or sneeze into your hand or into the air.  Wear a cloth face covering or face mask. Make sure your mask covers your nose and mouth. Where to find more information  Centers for Disease Control and Prevention: www.cdc.gov/coronavirus/2019-ncov/index.html  World Health Organization: www.who.int/health-topics/coronavirus Contact a health care provider if:  You live in or have traveled to an area where COVID-19 is a risk and you have symptoms of the infection.  You have had contact with someone who has COVID-19 and you have symptoms of the infection. Get help right away if:  You have trouble breathing.  You have pain or pressure in your chest.  You have confusion.  You have bluish lips and fingernails.  You have difficulty waking from sleep.  You have symptoms that get worse. These symptoms may represent a serious problem that is an emergency. Do not wait to see if the symptoms will go away. Get medical help right away. Call your local emergency services (911 in the [place] Do not drive yourself to the hospital. Let the emergency medical personnel know if you think you have COVID-19. Summary  COVID-19 is a  respiratory infection that is caused by a virus. It is also known as coronavirus disease or novel coronavirus. It can cause serious infections, such as pneumonia, acute respiratory distress syndrome, acute respiratory failure, or sepsis.  The virus that causes COVID-19 is contagious. This means that it can spread from person to person through droplets from breathing, speaking, singing, coughing, or sneezing.  You are more likely to develop a serious illness if you are 50 years of age or older, have a weak immune system, live in a nursing home, or have chronic disease.  There is no medicine to treat COVID-19. Your health care provider will talk with you about ways to treat your symptoms.    Take steps to protect yourself and others from infection. Wash your hands often and disinfect objects and surfaces that are frequently touched every day. Stay away from people who are sick and wear a mask if you are sick. This information is not intended to replace advice given to you by your health care provider. Make sure you discuss any questions you have with your health care provider. Document Revised: 12/14/2018 Document Reviewed: 03/22/2018 Elsevier Patient Education  2020 [employer]  

## 2019-12-30 NOTE — Progress Notes (Signed)
SATURATION QUALIFICATIONS: (This note is used to comply with regulatory documentation for home oxygen)  Patient Saturations on Room Air at Rest = 95%  Patient Saturations on Room Air while Ambulating = 91%  Patient Saturations on Liters of oxygen while Ambulating = % NT  Please briefly explain why patient needs home oxygen:O2 saturation > 88% while ambulating on RA, no O2 needs. [place] Pager [phone] Office [phone]

## 2020-05-02 LAB — EXTERNAL GENERIC LAB PROCEDURE

## 2020-06-03 DIAGNOSIS — G500 Trigeminal neuralgia: Secondary | ICD-10-CM | POA: Insufficient documentation

## 2020-06-29 ENCOUNTER — Other Ambulatory Visit: Payer: Self-pay

## 2020-06-29 ENCOUNTER — Inpatient Hospital Stay (HOSPITAL_COMMUNITY)
Admission: EM | Admit: 2020-06-29 | Discharge: 2020-07-01 | DRG: 042 | Disposition: A | Discharge status: Home Health | Payer: Medicare Other | Attending: Internal Medicine | Admitting: Internal Medicine

## 2020-06-29 ENCOUNTER — Emergency Department (HOSPITAL_COMMUNITY): Payer: Medicare Other

## 2020-06-29 ENCOUNTER — Encounter (HOSPITAL_COMMUNITY): Payer: Self-pay | Admitting: Pharmacy Technician

## 2020-06-29 ENCOUNTER — Observation Stay (HOSPITAL_COMMUNITY): Payer: Medicare Other

## 2020-06-29 DIAGNOSIS — Z833 Family history of diabetes mellitus: Secondary | ICD-10-CM

## 2020-06-29 DIAGNOSIS — I639 Cerebral infarction, unspecified: Secondary | ICD-10-CM | POA: Diagnosis present

## 2020-06-29 DIAGNOSIS — E785 Hyperlipidemia, unspecified: Secondary | ICD-10-CM | POA: Diagnosis present

## 2020-06-29 DIAGNOSIS — I77810 Thoracic aortic ectasia: Secondary | ICD-10-CM | POA: Diagnosis present

## 2020-06-29 DIAGNOSIS — I10 Essential (primary) hypertension: Secondary | ICD-10-CM | POA: Diagnosis present

## 2020-06-29 DIAGNOSIS — R29702 NIHSS score 2: Secondary | ICD-10-CM | POA: Diagnosis present

## 2020-06-29 DIAGNOSIS — G8929 Other chronic pain: Secondary | ICD-10-CM | POA: Diagnosis present

## 2020-06-29 DIAGNOSIS — Z7984 Long term (current) use of oral hypoglycemic drugs: Secondary | ICD-10-CM

## 2020-06-29 DIAGNOSIS — Z87891 Personal history of nicotine dependence: Secondary | ICD-10-CM

## 2020-06-29 DIAGNOSIS — M549 Dorsalgia, unspecified: Secondary | ICD-10-CM | POA: Diagnosis present

## 2020-06-29 DIAGNOSIS — Z8616 Personal history of COVID-19: Secondary | ICD-10-CM

## 2020-06-29 DIAGNOSIS — I63431 Cerebral infarction due to embolism of right posterior cerebral artery: Principal | ICD-10-CM | POA: Diagnosis present

## 2020-06-29 DIAGNOSIS — Z79899 Other long term (current) drug therapy: Secondary | ICD-10-CM

## 2020-06-29 DIAGNOSIS — E1142 Type 2 diabetes mellitus with diabetic polyneuropathy: Secondary | ICD-10-CM | POA: Diagnosis present

## 2020-06-29 DIAGNOSIS — Z20822 Contact with and (suspected) exposure to covid-19: Secondary | ICD-10-CM | POA: Diagnosis present

## 2020-06-29 DIAGNOSIS — E039 Hypothyroidism, unspecified: Secondary | ICD-10-CM | POA: Diagnosis present

## 2020-06-29 DIAGNOSIS — H53469 Homonymous bilateral field defects, unspecified side: Secondary | ICD-10-CM | POA: Diagnosis present

## 2020-06-29 DIAGNOSIS — H547 Unspecified visual loss: Secondary | ICD-10-CM | POA: Diagnosis not present

## 2020-06-29 DIAGNOSIS — G2581 Restless legs syndrome: Secondary | ICD-10-CM | POA: Diagnosis present

## 2020-06-29 LAB — CBC WITH DIFFERENTIAL/PLATELET
Abs Immature Granulocytes: 0.01 10*3/uL (ref 0.00–0.07)
Basophils Absolute: 0 10*3/uL (ref 0.0–0.1)
Basophils Relative: 1 %
Eosinophils Absolute: 0.2 10*3/uL (ref 0.0–0.5)
Eosinophils Relative: 3 %
HCT: 36.6 % (ref 36.0–46.0)
Hemoglobin: 11.4 g/dL — ABNORMAL LOW (ref 12.0–15.0)
Immature Granulocytes: 0 %
Lymphocytes Relative: 27 %
Lymphs Abs: 1.6 10*3/uL (ref 0.7–4.0)
MCH: 31.6 pg (ref 26.0–34.0)
MCHC: 31.1 g/dL (ref 30.0–36.0)
MCV: 101.4 fL — ABNORMAL HIGH (ref 80.0–100.0)
Monocytes Absolute: 0.5 10*3/uL (ref 0.1–1.0)
Monocytes Relative: 8 %
Neutro Abs: 3.5 10*3/uL (ref 1.7–7.7)
Neutrophils Relative %: 61 %
Platelets: 215 10*3/uL (ref 150–400)
RBC: 3.61 MIL/uL — ABNORMAL LOW (ref 3.87–5.11)
RDW: 14.2 % (ref 11.5–15.5)
WBC: 5.8 10*3/uL (ref 4.0–10.5)
nRBC: 0 % (ref 0.0–0.2)

## 2020-06-29 LAB — I-STAT CHEM 8, ED
BUN: 23 mg/dL (ref 8–23)
Calcium, Ion: 1.31 mmol/L (ref 1.15–1.40)
Chloride: 100 mmol/L (ref 98–111)
Creatinine, Ser: 1 mg/dL (ref 0.44–1.00)
Glucose, Bld: 102 mg/dL — ABNORMAL HIGH (ref 70–99)
HCT: 34 % — ABNORMAL LOW (ref 36.0–46.0)
Hemoglobin: 11.6 g/dL — ABNORMAL LOW (ref 12.0–15.0)
Potassium: 3.6 mmol/L (ref 3.5–5.1)
Sodium: 141 mmol/L (ref 135–145)
TCO2: 30 mmol/L (ref 22–32)

## 2020-06-29 LAB — COMPREHENSIVE METABOLIC PANEL
ALT: 17 U/L (ref 0–44)
AST: 15 U/L (ref 15–41)
Albumin: 3.6 g/dL (ref 3.5–5.0)
Alkaline Phosphatase: 74 U/L (ref 38–126)
Anion gap: 6 (ref 5–15)
BUN: 21 mg/dL (ref 8–23)
CO2: 31 mmol/L (ref 22–32)
Calcium: 9.9 mg/dL (ref 8.9–10.3)
Chloride: 101 mmol/L (ref 98–111)
Creatinine, Ser: 1.02 mg/dL — ABNORMAL HIGH (ref 0.44–1.00)
GFR, Estimated: 58 mL/min — ABNORMAL LOW (ref >60)
Glucose, Bld: 146 mg/dL — ABNORMAL HIGH (ref 70–99)
Potassium: 3.6 mmol/L (ref 3.5–5.1)
Sodium: 138 mmol/L (ref 135–145)
Total Bilirubin: 0.6 mg/dL (ref 0.3–1.2)
Total Protein: 6.3 g/dL — ABNORMAL LOW (ref 6.5–8.1)

## 2020-06-29 LAB — RAPID URINE DRUG SCREEN, HOSP PERFORMED
Amphetamines: NOT DETECTED
Barbiturates: NOT DETECTED
Benzodiazepines: NOT DETECTED
Cocaine: NOT DETECTED
Opiates: NOT DETECTED
Tetrahydrocannabinol: NOT DETECTED

## 2020-06-29 LAB — PROTIME-INR
INR: 0.9 (ref 0.8–1.2)
Prothrombin Time: 12.6 seconds (ref 11.4–15.2)

## 2020-06-29 LAB — RESP PANEL BY RT-PCR (FLU A&B, COVID) ARPGX2
Influenza A by PCR: NEGATIVE
Influenza B by PCR: NEGATIVE
SARS Coronavirus 2 by RT PCR: NEGATIVE

## 2020-06-29 LAB — URINALYSIS, ROUTINE W REFLEX MICROSCOPIC
Bilirubin Urine: NEGATIVE
Glucose, UA: NEGATIVE mg/dL
Hgb urine dipstick: NEGATIVE
Ketones, ur: NEGATIVE mg/dL
Nitrite: POSITIVE — AB
Protein, ur: NEGATIVE mg/dL
Specific Gravity, Urine: 1.016 (ref 1.005–1.030)
pH: 7 (ref 5.0–8.0)

## 2020-06-29 LAB — HEMOGLOBIN A1C
Hgb A1c MFr Bld: 5.8 % — ABNORMAL HIGH (ref 4.8–5.6)
Mean Plasma Glucose: 119.76 mg/dL

## 2020-06-29 LAB — ETHANOL: Alcohol, Ethyl (B): <10 mg/dL (ref <10)

## 2020-06-29 LAB — TSH: TSH: 0.413 u[IU]/mL (ref 0.350–4.500)

## 2020-06-29 LAB — TROPONIN I (HIGH SENSITIVITY)
Troponin I (High Sensitivity): <2 ng/L (ref <18)
Troponin I (High Sensitivity): <2 ng/L

## 2020-06-29 LAB — APTT: aPTT: 27 s (ref 24–36)

## 2020-06-29 MED ORDER — ATENOLOL 25 MG PO TABS
50.0000 mg | ORAL_TABLET | Freq: Every day | ORAL | Status: DC
  Administered 2020-06-30 – 2020-07-01 (×2): 50 mg via ORAL
  Filled 2020-06-29 (×2): qty 2

## 2020-06-29 MED ORDER — INSULIN ASPART 100 UNIT/ML IJ SOLN: 0.0000 [IU] | Freq: Three times a day (TID) | INTRAMUSCULAR | Status: DC

## 2020-06-29 MED ORDER — GABAPENTIN 400 MG PO CAPS
800.0000 mg | ORAL_CAPSULE | Freq: Two times a day (BID) | ORAL | Status: DC
  Administered 2020-06-29 – 2020-07-01 (×4): 800 mg via ORAL
  Filled 2020-06-29 (×4): qty 2

## 2020-06-29 MED ORDER — ALBUTEROL SULFATE (2.5 MG/3ML) 0.083% IN NEBU: 2.5000 mg | INHALATION_SOLUTION | RESPIRATORY_TRACT | Status: DC | PRN

## 2020-06-29 MED ORDER — SENNOSIDES-DOCUSATE SODIUM 8.6-50 MG PO TABS: 1.0000 | ORAL_TABLET | Freq: Every evening | ORAL | Status: DC | PRN

## 2020-06-29 MED ORDER — HYDROCHLOROTHIAZIDE 25 MG PO TABS
50.0000 mg | ORAL_TABLET | Freq: Every day | ORAL | Status: DC
  Administered 2020-06-30 – 2020-07-01 (×2): 50 mg via ORAL
  Filled 2020-06-29 (×2): qty 2

## 2020-06-29 MED ORDER — ACETAMINOPHEN 650 MG RE SUPP: 650.0000 mg | Freq: Four times a day (QID) | RECTAL | Status: DC | PRN

## 2020-06-29 MED ORDER — ASCORBIC ACID 500 MG PO TABS
500.0000 mg | ORAL_TABLET | Freq: Every day | ORAL | Status: DC
  Administered 2020-06-30 – 2020-07-01 (×2): 500 mg via ORAL
  Filled 2020-06-29 (×2): qty 1

## 2020-06-29 MED ORDER — ASPIRIN 325 MG PO TABS
325.0000 mg | ORAL_TABLET | Freq: Every day | ORAL | Status: DC
  Administered 2020-06-29 – 2020-07-01 (×3): 325 mg via ORAL
  Filled 2020-06-29 (×3): qty 1

## 2020-06-29 MED ORDER — ATORVASTATIN CALCIUM 80 MG PO TABS
80.0000 mg | ORAL_TABLET | Freq: Every day | ORAL | Status: DC
  Administered 2020-06-29 – 2020-07-01 (×3): 80 mg via ORAL
  Filled 2020-06-29 (×2): qty 1
  Filled 2020-06-29: qty 2

## 2020-06-29 MED ORDER — ACETAMINOPHEN 325 MG PO TABS
650.0000 mg | ORAL_TABLET | Freq: Four times a day (QID) | ORAL | Status: DC | PRN
  Administered 2020-07-01 (×2): 650 mg via ORAL
  Filled 2020-06-29 (×2): qty 2

## 2020-06-29 MED ORDER — AMITRIPTYLINE HCL 25 MG PO TABS
25.0000 mg | ORAL_TABLET | Freq: Every day | ORAL | Status: DC
  Administered 2020-06-29 – 2020-06-30 (×2): 25 mg via ORAL
  Filled 2020-06-29 (×2): qty 1

## 2020-06-29 MED ORDER — CLOPIDOGREL BISULFATE 75 MG PO TABS
75.0000 mg | ORAL_TABLET | Freq: Every day | ORAL | Status: DC
  Administered 2020-06-30 – 2020-07-01 (×2): 75 mg via ORAL
  Filled 2020-06-29 (×2): qty 1

## 2020-06-29 MED ORDER — LEVOTHYROXINE SODIUM 25 MCG PO TABS
125.0000 ug | ORAL_TABLET | Freq: Every day | ORAL | Status: DC
  Administered 2020-06-30 – 2020-07-01 (×2): 125 ug via ORAL
  Filled 2020-06-29 (×2): qty 1

## 2020-06-29 MED ORDER — INSULIN GLARGINE 100 UNIT/ML ~~LOC~~ SOLN
10.0000 [IU] | Freq: Every day | SUBCUTANEOUS | Status: DC
  Administered 2020-06-30: 10 [IU] via SUBCUTANEOUS
  Filled 2020-06-29 (×2): qty 0.1

## 2020-06-29 MED ORDER — IOHEXOL 350 MG/ML SOLN
75.0000 mL | Freq: Once | INTRAVENOUS | Status: AC | PRN
  Administered 2020-06-29: 75 mL via INTRAVENOUS
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
## 2020-06-29 NOTE — ED Notes (Signed)
Patient transported to CT 

## 2020-06-29 NOTE — ED Provider Notes (Signed)
[facility] EMERGENCY DEPARTMENT Provider Note   CSN: [id number] Arrival date & time: 06/29/20  1223     History No chief complaint on file.   Rhonda Winters is a 75 y.o. female.  With past medical history of diabetes, hypothyroidism, hypertension who presents emergency department sent in by her eye doctor for suspected stroke.  On Saturday she had a bit of a headache and noticed some changes in her vision when she woke up she was having difficulty with her peripheral vision.  She went to see her eye doctor today who told her that it appears she has had a stroke and needed to come in for further evaluation.  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  She denies any new ataxia.  She has a history of peripheral neuropathy and has some gait issues at baseline.  She denies unilateral weakness, difficulty with speech or swallowing.  HPI     History reviewed. No pertinent past medical history.  Patient Active Problem List   Diagnosis Date Noted  . Acute hypoxemic respiratory failure due to COVID-19 ([place]) 12/26/2019  . HTN (hypertension) 12/26/2019  . Diabetic neuropathy associated with type 2 diabetes mellitus ([place]) 12/26/2019  . Chronic back pain 12/26/2019  . T2DM (type 2 diabetes mellitus) ([place]) 12/26/2019  . Hypokalemia 12/26/2019  . Elevated LFTs 12/26/2019  . Hypothyroidism 12/26/2019    History reviewed. No pertinent surgical history.   OB History   No obstetric history on file.     No family history on file.     Home Medications Prior to Admission medications   Medication Sig Start Date End Date Taking? Authorizing Provider  acetaminophen (TYLENOL) 500 MG tablet Take 1,000 mg by mouth every 6 (six) hours as needed for moderate pain.    [provider]  albuterol (VENTOLIN HFA) 108 (90 Base) MCG/ACT inhaler Inhale 1 puff into the lungs every 4 (four) hours as needed for shortness of breath. 12/30/19   Pokhrel, Corrie Mckusick, MD  amitriptyline  (ELAVIL) 25 MG tablet Take 25 mg by mouth at bedtime. 11/18/19   [provider]  ascorbic acid (VITAMIN C) 500 MG tablet Take 1 tablet (500 mg total) by mouth daily. 12/31/19   Pokhrel, Corrie Mckusick, MD  atenolol (TENORMIN) 50 MG tablet Take 50 mg by mouth daily. 11/11/19   [provider]  diclofenac (VOLTAREN) 75 MG EC tablet Take 75 mg by mouth 2 (two) times daily. 11/18/19   [provider]  DULoxetine (CYMBALTA) 60 MG capsule Take 60 mg by mouth daily. 11/18/19   [provider]  EUTHYROX 125 MCG tablet Take 125 mcg by mouth daily. 09/02/19   [provider]  furosemide (LASIX) 20 MG tablet Take 20 mg by mouth daily as needed for fluid.  08/21/19   [provider]  gabapentin (NEURONTIN) 800 MG tablet Take 800 mg by mouth 2 (two) times daily.  11/18/19   [provider]  guaiFENesin-dextromethorphan (ROBITUSSIN DM) 100-10 MG/5ML syrup Take 10 mLs by mouth every 4 (four) hours as needed for cough. 12/30/19   Pokhrel, Corrie Mckusick, MD  hydrochlorothiazide (HYDRODIURIL) 50 MG tablet Take 50 mg by mouth daily. 11/18/19   [provider]  metFORMIN (GLUCOPHAGE-XR) 500 MG 24 hr tablet Take 750 mg by mouth daily.  11/18/19   [provider]  pantoprazole (PROTONIX) 40 MG tablet Take 1 tablet (40 mg total) by mouth daily for 15 days. 12/30/19 01/14/20  Pokhrel, Corrie Mckusick, MD  zinc sulfate 220 (50  Zn) MG capsule Take 1 capsule (220 mg total) by mouth daily. 12/31/19   Pokhrel, Corrie Mckusick, MD    Allergies    Patient has no known allergies.  Review of Systems   Review of Systems Ten systems reviewed and are negative for acute change, except as noted in the HPI.   Physical Exam Updated Vital Signs BP (!) 146/75   Pulse (!) 49   Temp 98 F (36.7 C)   Resp 19   SpO2 (!) 76%   Physical Exam Vitals and nursing note reviewed.  Constitutional:      General: She is not in acute distress.    Appearance: She is well-developed. She is not  diaphoretic.  HENT:     Head: Normocephalic and atraumatic.  Eyes:     General: No scleral icterus.    Conjunctiva/sclera: Conjunctivae normal.     Comments: OD: Nasal side upper and lower field cut OS: Temporal side upper and lower field cut Exam consistent with homonymous hemianopsia  Cardiovascular:     Rate and Rhythm: Normal rate and regular rhythm.     Heart sounds: Normal heart sounds. No murmur heard. No friction rub. No gallop.   Pulmonary:     Effort: Pulmonary effort is normal. No respiratory distress.     Breath sounds: Normal breath sounds.  Abdominal:     General: Bowel sounds are normal. There is no distension.     Palpations: Abdomen is soft. There is no mass.     Tenderness: There is no abdominal tenderness. There is no guarding.  Musculoskeletal:     Cervical back: Normal range of motion.  Skin:    General: Skin is warm and dry.  Neurological:     Mental Status: She is alert and oriented to person, place, and time.     GCS: GCS eye subscore is 4. GCS verbal subscore is 5. GCS motor subscore is 6.     Cranial Nerves: Cranial nerve deficit (as noted in eye exam) present.     Sensory: Sensation is intact.     Motor: Motor function is intact.     Coordination: Finger-Nose-Finger Test abnormal (Dysmetria with left-sided finger-to-nose).  Psychiatric:        Behavior: Behavior normal.     ED Results / Procedures / Treatments   Labs (all labs ordered are listed, but only abnormal results are displayed) Labs Reviewed  COMPREHENSIVE METABOLIC PANEL - Abnormal; Notable for the following components:      Result Value   Glucose, Bld 146 (*)    Creatinine, Ser 1.02 (*)    Total Protein 6.3 (*)    GFR, Estimated 58 (*)    All other components within normal limits  CBC WITH DIFFERENTIAL/PLATELET - Abnormal; Notable for the following components:   RBC 3.61 (*)    Hemoglobin 11.4 (*)    MCV 101.4 (*)    All other components within normal limits  RESP PANEL BY  RT-PCR (FLU A&B, COVID) ARPGX2  PROTIME-INR  ETHANOL  APTT  RAPID URINE DRUG SCREEN, HOSP PERFORMED  URINALYSIS, ROUTINE W REFLEX MICROSCOPIC  I-STAT CHEM 8, ED  TROPONIN I (HIGH SENSITIVITY)    EKG EKG Interpretation  Date/Time:  Monday Jun 29 2020 13:44:35 EDT Ventricular Rate:  62 PR Interval:  182 QRS Duration: 104 QT Interval:  449 QTC Calculation: 456 R Axis:   -3 Text Interpretation: Sinus rhythm Low voltage, precordial leads Consider anterior infarct Confirmed by Lacretia Leigh ([id number]) on 06/29/2020 2:04:53 PM  Radiology No results found.  Procedures Procedures   Medications Ordered in ED Medications - No data to display  ED Course  I have reviewed the triage vital signs and the nursing notes.  Pertinent labs & imaging results that were available during my care of the patient were reviewed by me and considered in my medical decision making (see chart for details).    MDM Rules/Calculators/A&P                          Patient presents to the emergency department with 2 days of homonymous hemianopsia sent in by her ophthalmologist.  Patient received a work-up for suspected stroke given her exam.  I ordered and reviewed labs that include CBC with mild macrocytic anemia, respiratory panel negative for influenza and COVID-19, APTT,, troponin, ethanol and UDS are negative, urinalysis is pending. 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  Case discussed with the internal medicine teaching service who will admit the patient for CVA.     Final Clinical Impression(s) / ED Diagnoses Final diagnoses:  None      Rx / DC Orders ED Discharge Orders    None       Margarita Mail, PA-C 06/30/20 1408    Lacretia Leigh, MD 07/01/20 1538

## 2020-06-29 NOTE — ED Notes (Signed)
Received verbal report from Janis RN  

## 2020-06-29 NOTE — ED Notes (Signed)
Attempted IV start X2  Without success.  Orders for IV team placed

## 2020-06-29 NOTE — ED Notes (Signed)
IV team at bedside at this time. 

## 2020-06-29 NOTE — ED Notes (Signed)
ED Provider at bedside, PA [facility]

## 2020-06-29 NOTE — ED Notes (Signed)
Attempted to call report and was advised the nurse would call me back  

## 2020-06-29 NOTE — ED Notes (Signed)
Pt escorted to bathroom with standby assist only.  Missed the "hat" to obtain urine specimen for the UA and drug screen.  Explained to her that we would place a purewick to obtain specimen

## 2020-06-29 NOTE — ED Notes (Signed)
Rhonda Winters would like to be called as soon as pt receives a room.  Please call him at [phone]
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
## 2020-06-29 NOTE — H&P (Addendum)
Date: 06/29/2020               Patient Name:  Rhonda Winters MRN: [id number]  DOB: 10/30/1945 Age / Sex: 75 y.o., female   PCP: Patient, No Pcp Per (Inactive)         Medical Service: Internal Medicine Teaching Service         Attending Physician: Dr. Heber [place], Rachel Moulds, DO    First Contact: Dr. Lisabeth Devoid Pager: [phone]  Second Contact: Dr. Gilford Rile Pager: [phone]       After Hours (After 5p/  First Contact Pager: [phone]  weekends / holidays): Second Contact Pager: [phone]   Chief Complaint: Vision Loss  History of Present Illness:  Ms. Holte is a 75 y/o Female with a PMHx of DM, restless leg syndrome, and hypothyroidismwho presents to the ED with peripheral L vision loss. Her symptoms started 5 days ago with painless vision loss in her L eye. She states that her routine was unchanged this weekend. She endorses getting her hit in the head by a hardened bag of sugar. She has not noticed any changes in taste, smell, dizziness, nausea, weakness. Her husband does note she has had some mental changes, but this is before the current event.  Patient went to see her eye doctor who suspected a stroke, and she presented to the ED.   ED Course:  CT and MRI showing new acute/early infarcts of the R PCA territory. Labs were unremarkable with the exception of new macrocytic anemia with Hgb of 11 and MCV of 101. ED PA spoke with Neurology who will see the patient.   Meds: Current Meds  Medication Sig  . acetaminophen (TYLENOL) 500 MG tablet Take 1,000 mg by mouth every 6 (six) hours as needed for moderate pain.  Marland Kitchen albuterol (VENTOLIN HFA) 108 (90 Base) MCG/ACT inhaler Inhale 1 puff into the lungs every 4 (four) hours as needed for shortness of breath.  Marland Kitchen amitriptyline (ELAVIL) 25 MG tablet Take 25 mg by mouth at bedtime.  Marland Kitchen ascorbic acid (VITAMIN C) 500 MG tablet Take 1 tablet (500 mg total) by mouth daily.  Marland Kitchen atenolol (TENORMIN) 50 MG tablet Take 50 mg by mouth daily.  . diclofenac (VOLTAREN)  75 MG EC tablet Take 75 mg by mouth 2 (two) times daily.  . DULoxetine (CYMBALTA) 60 MG capsule Take 60 mg by mouth daily.  Arna Medici 125 MCG tablet Take 125 mcg by mouth daily.  . furosemide (LASIX) 20 MG tablet Take 20 mg by mouth daily as needed for fluid.   Marland Kitchen gabapentin (NEURONTIN) 800 MG tablet Take 800 mg by mouth 2 (two) times daily.   . hydrochlorothiazide (HYDRODIURIL) 50 MG tablet Take 50 mg by mouth daily.  . metFORMIN (GLUCOPHAGE-XR) 500 MG 24 hr tablet Take 1,000 mg by mouth every evening.    Allergies: Allergies as of 06/29/2020  . (No Known Allergies)   History reviewed. No pertinent past medical history.  Family History: Stroke: None Mother: Thrombocytopenia, Diabetes Father: Bad heart  Social History:  Lives at home Tobacco: Previously smoked cigarettes, quit 40 years ago. Half pack a day for 25 years.  Alcohol: Socially a glass of wine x2 week Drugs: None  Review of Systems: A complete ROS was negative except as per HPI.  Physical Exam: Blood pressure 140/77, pulse (!) 127, temperature 98 F (36.7 C), resp. rate 18, SpO2 96 %. Physical Exam Constitutional:      General: She is not in acute distress.  Appearance: She is not ill-appearing, toxic-appearing or diaphoretic.  Eyes:     General: No scleral icterus.       Right eye: No discharge.        Left eye: No discharge.     Extraocular Movements: Extraocular movements intact.     Conjunctiva/sclera: Conjunctivae normal.     Pupils: Pupils are equal, round, and reactive to light.  Cardiovascular:     Rate and Rhythm: Normal rate and regular rhythm.     Pulses: Normal pulses.     Heart sounds: Normal heart sounds. No murmur heard. No friction rub. No gallop.   Pulmonary:     Effort: Pulmonary effort is normal.     Breath sounds: Normal breath sounds. No wheezing, rhonchi or rales.  Chest:     Chest wall: No tenderness.  Abdominal:     General: Abdomen is flat. Bowel sounds are normal.      Palpations: Abdomen is soft.     Tenderness: There is no abdominal tenderness. There is no guarding.  Musculoskeletal:     Comments: Strength 5/5 in the upper and lower extremities bilaterally.   Skin:    General: Skin is warm.  Neurological:     Mental Status: She is alert and oriented to person, place, and time.     Comments: CN II-XII grossly intact with exception of L sided hemianopia    Psychiatric:        Mood and Affect: Mood normal.        Behavior: Behavior normal.    EKG: personally reviewed my interpretation sinus rhythm  CLINICAL DATA:  Neuro deficit, acute, stroke suspected. Additional history provided: Patient reports vision loss in left eye.  EXAM: MRI HEAD WITHOUT CONTRAST  TECHNIQUE: Multiplanar, multiecho pulse sequences of the brain and surrounding structures were obtained without intravenous contrast.  COMPARISON:  Noncontrast head CT performed earlier today 06/29/2020.  FINDINGS: Brain:  Mild intermittent motion degradation.  Mild cerebral and cerebellar atrophy.  Region of cortical/subcortical restricted diffusion and T2/FLAIR hyperintense signal abnormality within the posteromedial right temporal lobe and right occipital lobe, measuring 6.0 x 3.2 x 3.4 cm (AP x TV x CC). Findings are compatible with acute/early subacute right PCA territory infarction. Acute/early subacute infarction changes are also present more anteriorly within the right hippocampus (also right PCA territory). Local mass effect with partial effacement of the right lateral ventricle occipital horn. No midline shift. No evidence of hemorrhagic conversion.  Background moderate multifocal T2/FLAIR hyperintensity within the cerebral white matter, nonspecific but compatible with chronic small vessel ischemic disease.  Multiple T2 hyperintense foci within the deep gray nuclei likely reflecting a combination of chronic lacunar infarcts and prominent perivascular  spaces.  Subcentimeter chronic infarct within the right cerebellar hemisphere (series 5, image 6).  No evidence of intracranial mass.  No chronic intracranial blood products.  No extra-axial fluid collection.  No midline shift.  Vascular: No definite loss of expected proximal arterial flow voids. Linear focus of SWI signal loss within the right cerebellar hemisphere, suspicious for a developmental venous anomaly (an anatomic variant)  Skull and upper cervical spine: No focal marrow lesion.  Sinuses/Orbits: Visualized orbits show no acute finding. Prior bilateral lens replacement. Trace bilateral ethmoid sinus mucosal thickening. Mild left maxillary sinus mucosal thickening.  IMPRESSION: Acute/early subacute cortical and subcortical right PCA territory infarct affecting the right hippocampus, posteromedial right temporal lobe and right occipital lobe as described. Local mass effect with partial effacement of the right lateral ventricle occipital  horn. No midline shift. No evidence of hemorrhagic conversion.  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 Background moderate cerebral white matter chronic small vessel ischemic disease.  Subcentimeter chronic infarct within the right cerebellar hemisphere.  Mild generalized parenchymal atrophy.  Electronically Signed   By: Kellie Simmering DO   On: 06/29/2020 15:46  CLINICAL DATA:  Monocular vision loss.  EXAM: CT HEAD WITHOUT CONTRAST  TECHNIQUE: Contiguous axial images were obtained from the base of the skull through the vertex without intravenous contrast.  COMPARISON:  None.  FINDINGS: Brain: Brainstem and cerebellum are normal. Confluent low density is present in the right occipital lobe consistent with right PCA territory infarction. Mild swelling but no hemorrhage. No visible infarction of the right thalamus. Cerebral hemispheres show moderate chronic small-vessel changes  of the white matter. No mass, hydrocephalus or extra-axial collection.  Vascular: There is atherosclerotic calcification of the major vessels at the base of the brain.  Skull: Negative  Sinuses/Orbits: Clear/normal  Other: None  IMPRESSION: Acute infarction in the right occipital lobe with low-density and mild swelling. No hemorrhage. Findings consistent with partial right PCA territory infarction.  Chronic small-vessel ischemic changes elsewhere affecting the cerebral hemispheric white matter.  Assessment & Plan by Problem: Active Problems:   Stroke [facility])  Ms. Macmullen is a 75 yo F, with a PMH of DM, HTN, restless leg syndrome, chronic back pain,  who presented to the ED with 5 days of L eye vision loss, and admitted for a R PCA stroke.   R PCA Stroke:  Patient presenting with 5 days of L eye vision loss found to have a PCA stroke. Risk factors of DM and HTN on medications. She is compliant with her home medications, her husband is a pharmacist who fills her pill box. CT shows chronic lacunar infarcts bilaterally. Hx of smoking 40 years ago, drinks 2 ETOH a week. Neurology consulted in the ED, with IM to admit.  - Appreciate neurology's recommendations - Diet NPO - Swallow Study  - A1c - Lipid panel  - Echo w/ bubble study  - Frequent neuro checks - PT/OT/SLP eval and treat - Lipitor 80 - ASA 325 mg  - CTA head/neck - Tele  Hypertension:  Outside of Hypertensive window, will continue home regimen per neurology recommendations.  - Atenolol 50 mg daily - HCTZ 50 mg daily   Hypothyroidism:  - Synthroid 125 mg  - TSH  T2DM: On Metformin 1000 mg nightly - Lantus 10 units - SSI   Restless Leg:  - Amitriptyline 25 mg bedtime   Dispo: Admit patient to Observation with expected length of stay less than 2 midnights.  Signed: Maudie Mercury, MD 06/29/2020, 7:23 PM  Pager: [phone] After 5pm on weekdays and 1pm on weekends: On Call pager: [phone]

## 2020-06-29 NOTE — Consult Note (Signed)
Neurology Consultation  Reason for Consult: Visual field deficits, stroke on MRI Referring Physician: Dr. Heber Frederick  CC: Visual field deficits, stroke on MRI  History is obtained from: Patient, chart  HPI: Rhonda Winters is a 75 y.o. female past medical history of diabetes, restless leg syndrome, hypothyroidism presented to the emergency room for visual field deficits in the left side with last known well sometime around 10 PM on Friday night when she went to bed.  She reports she woke up on Saturday morning and was not able to see the left visual field clearly.  She initially thought that her left eye was issue but then she realized that she was not able to see the left field of vision from both eyes.  The symptoms did not improve and she came to the emergency room for further evaluation.  CT head was done that showed hypodensity in the right PCA territory and MRI of the brain was done that showed a large right PCA territory subacute infarct.  She is being admitted for stroke work-up and neurological consultation was obtained for further recommendations. She had COVID in October 2020, made complete recovery.  She reports that at that time she was told that she had a couple of episodes of atrial fibrillation but has not had a diagnosis of A. fib and is not on any anticoagulation or antiplatelets. Denies any chest pain or shortness of breath. Tingling numbness or weakness.  No headaches.  LKW: Friday, 06/26/2020 at 10 PM tpa given?: no, outside window Premorbid modified Rankin scale (mRS): 0  ROS: Full ROS was performed and is negative except as noted in the HPI.  History reviewed. No pertinent past medical history. Past medical history as above  No family history on file.  Social History:   has no history on file for tobacco use, alcohol use, and drug use. She is an Tour manager by profession-retired.  Medications  Current Facility-Administered Medications:  .  acetaminophen  (TYLENOL) tablet 650 mg, 650 mg, Oral, Q6H PRN **OR** acetaminophen (TYLENOL) suppository 650 mg, 650 mg, Rectal, Q6H PRN, Maudie Mercury, MD .  albuterol (PROVENTIL) (2.5 MG/3ML) 0.083% nebulizer solution 2.5 mg, 2.5 mg, Inhalation, Q4H PRN, Maudie Mercury, MD .  amitriptyline (ELAVIL) tablet 25 mg, 25 mg, Oral, QHS, Maudie Mercury, MD .  Derrill Memo ON 06/30/2020] ascorbic acid (VITAMIN C) tablet 500 mg, 500 mg, Oral, Daily, Maudie Mercury, MD .  gabapentin (NEURONTIN) capsule 800 mg, 800 mg, Oral, BID, Maudie Mercury, MD .  Derrill Memo ON 06/30/2020] levothyroxine (SYNTHROID) tablet 125 mcg, 125 mcg, Oral, Daily, Maudie Mercury, MD .  senna-docusate (Senokot-S) tablet 1 tablet, 1 tablet, Oral, QHS PRN, Maudie Mercury, MD  Current Outpatient Medications:  .  acetaminophen (TYLENOL) 500 MG tablet, Take 1,000 mg by mouth every 6 (six) hours as needed for moderate pain., Disp: , Rfl:  .  albuterol (VENTOLIN HFA) 108 (90 Base) MCG/ACT inhaler, Inhale 1 puff into the lungs every 4 (four) hours as needed for shortness of breath., Disp: 1 each, Rfl: 0 .  amitriptyline (ELAVIL) 25 MG tablet, Take 25 mg by mouth at bedtime., Disp: , Rfl:  .  ascorbic acid (VITAMIN C) 500 MG tablet, Take 1 tablet (500 mg total) by mouth daily., Disp: 30 tablet, Rfl: 0 .  atenolol (TENORMIN) 50 MG tablet, Take 50 mg by mouth daily., Disp: , Rfl:  .  diclofenac (VOLTAREN) 75 MG EC tablet, Take 75 mg by mouth 2 (two) times daily., Disp: , Rfl:  .  DULoxetine (CYMBALTA) 60 MG capsule, Take 60 mg by mouth daily., Disp: , Rfl:  .  EUTHYROX 125 MCG tablet, Take 125 mcg by mouth daily., Disp: , Rfl:  .  furosemide (LASIX) 20 MG tablet, Take 20 mg by mouth daily as needed for fluid. , Disp: , Rfl:  .  gabapentin (NEURONTIN) 800 MG tablet, Take 800 mg by mouth 2 (two) times daily. , Disp: , Rfl:  .  hydrochlorothiazide (HYDRODIURIL) 50 MG tablet, Take 50 mg by mouth daily., Disp: , Rfl:  .  metFORMIN (GLUCOPHAGE-XR) 500 MG 24 hr tablet,  Take 1,000 mg by mouth every evening., Disp: , Rfl:    Exam: Current vital signs: BP (!) 173/70   Pulse 66   Temp 98 F (36.7 C) (Oral)   Resp 18   SpO2 97%  Vital signs in last 24 hours: Temp:  [98 F (36.7 C)-98.5 F (36.9 C)] 98 F (36.7 C) (05/02 1940) Pulse Rate:  [49-127] 66 (05/02 1935) Resp:  [16-20] 18 (05/02 1800) BP: (126-173)/(62-88) 173/70 (05/02 1935) SpO2:  [72 %-100 %] 97 % (05/02 1935)  GENERAL: Awake, alert in NAD HEENT: - Normocephalic and atraumatic, dry mm, no LN++, no Thyromegally LUNGS - Clear to auscultation bilaterally with no wheezes CV - S1S2 RRR, no m/r/g, equal pulses bilaterally. ABDOMEN - Soft, nontender, nondistended with normoactive BS Ext: warm, well perfused, intact peripheral pulses, trace edema bilaterally  NEURO:  Mental Status: AA&Ox3  Language: speech with no dysarthria.  Naming, repetition, fluency, and comprehension intact. Cranial Nerves: PERRL EOMI, visual fields examination reveals left anonymous hemianopsia, no facial asymmetry, facial sensation intact, hearing intact, tongue/uvula/soft palate midline, normal sternocleidomastoid and trapezius muscle strength. No evidence of tongue atrophy or fibrillations Motor:/5 without drift in all fours Tone: is normal and bulk is normal Sensation- Intact to light touch bilaterally, no extinction Coordination: FTN intact bilaterally, no ataxia in BLE. Gait- deferred  NIHSS-2   Labs I have reviewed labs in epic and the results pertinent to this consultation are:   CBC    Component Value Date/Time   WBC 5.8 06/29/2020 1300   RBC 3.61 (L) 06/29/2020 1300   HGB 11.6 (L) 06/29/2020 1543   HCT 34.0 (L) 06/29/2020 1543   PLT 215 06/29/2020 1300   MCV 101.4 (H) 06/29/2020 1300   MCH 31.6 06/29/2020 1300   MCHC 31.1 06/29/2020 1300   RDW 14.2 06/29/2020 1300   LYMPHSABS 1.6 06/29/2020 1300   MONOABS 0.5 06/29/2020 1300   EOSABS 0.2 06/29/2020 1300   BASOSABS 0.0 06/29/2020 1300     CMP     Component Value Date/Time   NA 141 06/29/2020 1543   K 3.6 06/29/2020 1543   CL 100 06/29/2020 1543   CO2 31 06/29/2020 1300   GLUCOSE 102 (H) 06/29/2020 1543   BUN 23 06/29/2020 1543   CREATININE 1.00 06/29/2020 1543   CALCIUM 9.9 06/29/2020 1300   PROT 6.3 (L) 06/29/2020 1300   ALBUMIN 3.6 06/29/2020 1300   AST 15 06/29/2020 1300   ALT 17 06/29/2020 1300   ALKPHOS 74 06/29/2020 1300   BILITOT 0.6 06/29/2020 1300   GFRNONAA 58 (L) 06/29/2020 1300   Imaging I have reviewed the images obtained:  CT-scan of the brain-right PCA hypodensity - likely subacute stroke   MRI examination of the brain confirms the finding of the CT head-acute/early subacute cortical and subcortical right PCA territory infarct affecting the right hippocampus, posterior meter right temporal lobe and right occipital lobe.  Local  mass-effect with partial effacement of the right lateral ventricle occipital horn.  No midline shift.  No evidence of hemorrhagic conversion.  Chronic lacunar infarcts in the deep gray nuclei bilaterally.  Moderate cerebral white matter chronic ischemic small vessel disease.  Subcentimeter chronic infarct in the right cerebellar hemisphere.  Mild generalized atrophy.  Assessment: 75 year old with above past medical history presenting with complaints of visual field deficits affecting the left visual field, noted to have a late acute/early subacute stroke on the CT head affecting the right PCA territory. Etiology likely atheroembolic versus cardioembolic-needs further work-up. On no antiplatelets or anticoagulants at home-has had a brief episode of atrial fibrillation post COVID but has not had a formal diagnosis of having atrial fibrillation and is not on anticoagulation.  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 -CT Angiogram of Head and neck -Echocardiogram -HgbA1c, fasting lipid panel -Frequent neuro checks -Prophylactic therapy-Antiplatelet med: Aspirin - dose 325mg  PO -Atorvastatin 80 mg PO daily -Risk factor modification -PT consult, OT consult, Speech consult -If Afib found on telemetry, will need anticoagulation. Decision pending imaging and stroke team rounding. Plan relayed to the admitting team resident physician  Please page stroke NP/PA/MD (listed on AMION)  from 8am-4 pm as this patient will be followed by the stroke team at this point.  -- Amie Portland, MD Neurologist [facility] Pager: [phone]

## 2020-06-30 ENCOUNTER — Observation Stay (HOSPITAL_COMMUNITY): Payer: Medicare Other

## 2020-06-30 DIAGNOSIS — I77810 Thoracic aortic ectasia: Secondary | ICD-10-CM | POA: Diagnosis present

## 2020-06-30 DIAGNOSIS — E039 Hypothyroidism, unspecified: Secondary | ICD-10-CM | POA: Diagnosis present

## 2020-06-30 DIAGNOSIS — R002 Palpitations: Secondary | ICD-10-CM

## 2020-06-30 DIAGNOSIS — Z79899 Other long term (current) drug therapy: Secondary | ICD-10-CM | POA: Diagnosis not present

## 2020-06-30 DIAGNOSIS — Z833 Family history of diabetes mellitus: Secondary | ICD-10-CM | POA: Diagnosis not present

## 2020-06-30 DIAGNOSIS — I6389 Other cerebral infarction: Secondary | ICD-10-CM | POA: Diagnosis not present

## 2020-06-30 DIAGNOSIS — Z8616 Personal history of COVID-19: Secondary | ICD-10-CM | POA: Diagnosis not present

## 2020-06-30 DIAGNOSIS — I63431 Cerebral infarction due to embolism of right posterior cerebral artery: Secondary | ICD-10-CM | POA: Diagnosis present

## 2020-06-30 DIAGNOSIS — I639 Cerebral infarction, unspecified: Secondary | ICD-10-CM | POA: Diagnosis present

## 2020-06-30 DIAGNOSIS — G2581 Restless legs syndrome: Secondary | ICD-10-CM

## 2020-06-30 DIAGNOSIS — R29702 NIHSS score 2: Secondary | ICD-10-CM | POA: Diagnosis present

## 2020-06-30 DIAGNOSIS — E119 Type 2 diabetes mellitus without complications: Secondary | ICD-10-CM | POA: Diagnosis not present

## 2020-06-30 DIAGNOSIS — Z20822 Contact with and (suspected) exposure to covid-19: Secondary | ICD-10-CM | POA: Diagnosis present

## 2020-06-30 DIAGNOSIS — M549 Dorsalgia, unspecified: Secondary | ICD-10-CM | POA: Diagnosis present

## 2020-06-30 DIAGNOSIS — Z87891 Personal history of nicotine dependence: Secondary | ICD-10-CM | POA: Diagnosis not present

## 2020-06-30 DIAGNOSIS — E785 Hyperlipidemia, unspecified: Secondary | ICD-10-CM | POA: Diagnosis present

## 2020-06-30 DIAGNOSIS — G8929 Other chronic pain: Secondary | ICD-10-CM | POA: Diagnosis present

## 2020-06-30 DIAGNOSIS — H547 Unspecified visual loss: Secondary | ICD-10-CM | POA: Diagnosis present

## 2020-06-30 DIAGNOSIS — E782 Mixed hyperlipidemia: Secondary | ICD-10-CM | POA: Diagnosis not present

## 2020-06-30 DIAGNOSIS — H53469 Homonymous bilateral field defects, unspecified side: Secondary | ICD-10-CM | POA: Diagnosis present

## 2020-06-30 DIAGNOSIS — I10 Essential (primary) hypertension: Secondary | ICD-10-CM

## 2020-06-30 DIAGNOSIS — Z7984 Long term (current) use of oral hypoglycemic drugs: Secondary | ICD-10-CM | POA: Diagnosis not present

## 2020-06-30 DIAGNOSIS — E1142 Type 2 diabetes mellitus with diabetic polyneuropathy: Secondary | ICD-10-CM | POA: Diagnosis present

## 2020-06-30 LAB — ECHOCARDIOGRAM COMPLETE BUBBLE STUDY
AR max vel: 2 cm2
AV Area VTI: 1.89 cm2
AV Area mean vel: 1.95 cm2
AV Mean grad: 6 mmHg
AV Peak grad: 11.2 mmHg
Ao pk vel: 1.67 m/s
Area-P 1/2: 4.06 cm2
Calc EF: 60.7 %
S' Lateral: 2.9 cm
Single Plane A2C EF: 61.4 %
Single Plane A4C EF: 60.6 %

## 2020-06-30 LAB — LIPID PANEL
Cholesterol: 276 mg/dL — ABNORMAL HIGH (ref 0–200)
HDL: 46 mg/dL (ref >40)
LDL Cholesterol: 203 mg/dL — ABNORMAL HIGH (ref 0–99)
Total CHOL/HDL Ratio: 6 RATIO
Triglycerides: 135 mg/dL (ref <150)
VLDL: 27 mg/dL (ref 0–40)

## 2020-06-30 LAB — COMPREHENSIVE METABOLIC PANEL
ALT: 15 U/L (ref 0–44)
AST: 15 U/L (ref 15–41)
Albumin: 3.4 g/dL — ABNORMAL LOW (ref 3.5–5.0)
Alkaline Phosphatase: 70 U/L (ref 38–126)
Anion gap: 9 (ref 5–15)
BUN: 14 mg/dL (ref 8–23)
CO2: 30 mmol/L (ref 22–32)
Calcium: 9.9 mg/dL (ref 8.9–10.3)
Chloride: 101 mmol/L (ref 98–111)
Creatinine, Ser: 0.73 mg/dL (ref 0.44–1.00)
GFR, Estimated: >60 mL/min (ref >60)
Glucose, Bld: 103 mg/dL — ABNORMAL HIGH (ref 70–99)
Potassium: 3.2 mmol/L — ABNORMAL LOW (ref 3.5–5.1)
Sodium: 140 mmol/L (ref 135–145)
Total Bilirubin: 0.3 mg/dL (ref 0.3–1.2)
Total Protein: 5.8 g/dL — ABNORMAL LOW (ref 6.5–8.1)

## 2020-06-30 LAB — CBC
HCT: 33.8 % — ABNORMAL LOW (ref 36.0–46.0)
Hemoglobin: 10.9 g/dL — ABNORMAL LOW (ref 12.0–15.0)
MCH: 32 pg (ref 26.0–34.0)
MCHC: 32.2 g/dL (ref 30.0–36.0)
MCV: 99.1 fL (ref 80.0–100.0)
Platelets: 198 10*3/uL (ref 150–400)
RBC: 3.41 MIL/uL — ABNORMAL LOW (ref 3.87–5.11)
RDW: 14.2 % (ref 11.5–15.5)
WBC: 5.7 10*3/uL (ref 4.0–10.5)
nRBC: 0 % (ref 0.0–0.2)

## 2020-06-30 LAB — GLUCOSE, CAPILLARY
Glucose-Capillary: 110 mg/dL — ABNORMAL HIGH (ref 70–99)
Glucose-Capillary: 93 mg/dL (ref 70–99)
Glucose-Capillary: 106 mg/dL — ABNORMAL HIGH (ref 70–99)
Glucose-Capillary: 134 mg/dL — ABNORMAL HIGH (ref 70–99)

## 2020-06-30 MED ORDER — POTASSIUM CHLORIDE CRYS ER 20 MEQ PO TBCR
40.0000 meq | EXTENDED_RELEASE_TABLET | Freq: Two times a day (BID) | ORAL | Status: AC
  Administered 2020-06-30 (×2): 40 meq via ORAL
  Filled 2020-06-30 (×2): qty 2

## 2020-06-30 NOTE — Progress Notes (Signed)
HD#0 Subjective:  Overnight Events: None   Rhonda Winters was seen and evaluated at bedside this AM. She states that the ringing in her ears has gone. Her vision has not changed since yesterday. She has not noticed any new loss of strength or sensation.   Objective:  Vital signs in last 24 hours: Vitals:   06/29/20 2030 06/29/20 2115 06/29/20 2300 06/30/20 0400  BP: (!) 169/84 (!) 172/85 (!) 169/78 (!) 145/76  Pulse: 64 63 60 68  Resp:  16 16 18   Temp:   98.4 F (36.9 C) 98.2 F (36.8 C)  TempSrc:   Oral Oral  SpO2: 92% 90% 93% 95%   Supplemental O2: Room Air SpO2: 95 %   Physical Exam:  Physical Exam Constitutional:      General: She is not in acute distress.    Appearance: Normal appearance.  Cardiovascular:     Rate and Rhythm: Normal rate and regular rhythm.  Pulmonary:     Effort: Pulmonary effort is normal.     Breath sounds: Normal breath sounds.  Skin:    General: Skin is warm and dry.  Neurological:     Mental Status: She is alert.     Comments: Left hemiopsia of the left eye, sensation intact, strength intact, speech is normal, no facial droop  Psychiatric:        Mood and Affect: Mood normal.        Behavior: Behavior normal.    There were no vitals filed for this visit.   Intake/Output Summary (Last 24 hours) at 06/30/2020 0657 Last data filed at 06/30/2020 0600 Gross per 24 hour  Intake --  Output 250 ml  Net -250 ml   Net IO Since Admission: -250 mL [06/30/20 0657]  Pertinent Labs: CBC Latest Ref Rng & Units 06/30/2020 06/29/2020 06/29/2020  WBC 4.0 - 10.5 K/uL 5.7 - 5.8  Hemoglobin 12.0 - 15.0 g/dL 10.9(L) 11.6(L) 11.4(L)  Hematocrit 36.0 - 46.0 % 33.8(L) 34.0(L) 36.6  Platelets 150 - 400 K/uL 198 - 215    CMP Latest Ref Rng & Units 06/30/2020 06/29/2020 06/29/2020  Glucose 70 - 99 mg/dL 103(H) 102(H) 146(H)  BUN 8 - 23 mg/dL 14 23 21   Creatinine 0.44 - 1.00 mg/dL 0.73 1.00 1.02(H)  Sodium 135 - 145 mmol/L 140 141 138  Potassium 3.5 - 5.1 mmol/L  3.2(L) 3.6 3.6  Chloride 98 - 111 mmol/L 101 100 101  CO2 22 - 32 mmol/L 30 - 31  Calcium 8.9 - 10.3 mg/dL 9.9 - 9.9  Total Protein 6.5 - 8.1 g/dL 5.8(L) - 6.3(L)  Total Bilirubin 0.3 - 1.2 mg/dL 0.3 - 0.6  Alkaline Phos 38 - 126 U/L 70 - 74  AST 15 - 41 U/L 15 - 15  ALT 0 - 44 U/L 15 - 17   A1x 5.8  Lipid Panel     Component Value Date/Time   CHOL 276 (H) 06/30/2020 0401   TRIG 135 06/30/2020 0401   HDL 46 06/30/2020 0401   CHOLHDL 6.0 06/30/2020 0401   VLDL 27 06/30/2020 0401   LDLCALC 203 (H) 06/30/2020 0401    Imaging: CT ANGIO HEAD W OR WO CONTRAST  Result Date: 06/29/2020 IMPRESSION: 1. 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 2. No other intracranial arterial occlusion or high-grade stenosis. Aortic Atherosclerosis (ICD10-I70.0). Electronically Signed   By: Ulyses Jarred M.D.   On: 06/29/2020 22:03   CT Head Wo Contrast Result Date: 06/29/2020  IMPRESSION: Acute infarction in  the right occipital lobe with low-density and mild swelling. No hemorrhage. Findings consistent with partial right PCA territory infarction. Chronic small-vessel ischemic changes elsewhere affecting the cerebral hemispheric white matter. Electronically Signed   By: Nelson Chimes M.D.   On: 06/29/2020 14:59   CT ANGIO NECK W OR WO CONTRAST  Result Date: 06/29/2020 IMPRESSION: 1. 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 2. No other intracranial arterial occlusion or high-grade stenosis. Aortic Atherosclerosis (ICD10-I70.0). Electronically Signed   By: Ulyses Jarred M.D.   On: 06/29/2020 22:03   MR BRAIN WO CONTRAST  Result Date: 06/29/2020 IMPRESSION: Acute/early subacute cortical and subcortical right PCA territory infarct affecting the right hippocampus, posteromedial right temporal lobe and right occipital lobe as described. Local mass effect with partial effacement of the right lateral ventricle occipital horn. No midline shift. No evidence of hemorrhagic conversion. Chronic lacunar infarcts within  the deep gray nuclei bilaterally. Background moderate cerebral white matter chronic small vessel ischemic disease. Subcentimeter chronic infarct within the right cerebellar hemisphere. Mild generalized parenchymal atrophy. Electronically Signed   By: Kellie Simmering DO   On: 06/29/2020 15:46    Assessment/Plan:   Active Problems:   Stroke [facility])   Patient Summary:  Rhonda Winters is a 75 yo F, with a PMH of DM, HTN, restless leg syndrome, chronic back pain,  who presented to the ED with 5 days of L eye vision loss, and admitted for a R PCA stroke.   R PCA Stroke:  Patient presenting with 5 days of L eye vision loss found to have a PCA stroke. CTA with R PCA occlusion at the P2/P3 junction. Echo with EF of 60-65%. No worsening of symptoms today. Was able to work with PT and OT today. Will bee seen by EP to evaluate for possible cardiac monitoring.  - Appreciate neurology's recommendations - Continue atorvastatin 80 mg - ASA 325 mg and  plavix 75 mg for 3 months and then ASA alone - Frequent neuro checks  - Tele - PT/OT: Outpatient PT and OT  Hypertension:  - Continue Atenolol 50 mg daily andHCTZ 50 mg daily   Hypothyroidism:  - Synthroid 125 mg  - TSH 0.413  T2DM: On Metformin 1000 mg nightly - Lantus 10 units - SSI   Restless Leg:  - Amitriptyline 25 mg bedtime   Aortic dilation Dilation of the ascending aorta found incidentally on Echo measuring 46 mm. Will need need to be re-imaged at 6 months.    Diet: heart healthy, carb modified IVF: None,None VTE: SCDs Code: Full PT/OT recs: Pending, none. TOC recs: pending  Dispo: Anticipated discharge to pending in further management  Iona Beard, MD 06/30/2020, 6:57 AM Pager: [phone]  Please contact the on call pager after 5 pm and on weekends at [phone].

## 2020-06-30 NOTE — Progress Notes (Signed)
Occupational Therapy Evaluation Patient Details Name: Rhonda Winters MRN: [id number] DOB: [birth year] Today's Date: 06/30/2020    History of Present Illness Pt is a 75 y/o female with PMH of DM, restless leg syndrome, presenting to ER for visual field defciits on L side. 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   Clinical Impression   PTA patient independent and driving. Admitted for above and presenting with L sided visual field loss, impaired balance.  She currently requires min guard for transfers and in room mobility, up to min assist for ADLs.  Initiated education for visual scanning towards L side to locate items in room, min cueing to scan functionally during ADLs. She reports her husband manages her pill box, but he works out of town and she would be alone during the day.  Discussed safety and recommendations for no cooking, driving, or med mgmt at this time--pt agreeable and reports her daughter can assist as well. Pt will benefit from continued OT services while admitted and after dc at OP OT level to optimize independence and safety with ADLs, IADLs and mobility.  Will follow acutely.     Follow Up Recommendations  Outpatient OT;Supervision/Assistance - 24 hour    Equipment Recommendations  None recommended by OT    Recommendations for Other Services       Precautions / Restrictions Precautions Precautions: Fall Precaution Comments: L visual field loss Restrictions Weight Bearing Restrictions: No      Mobility Bed Mobility Overal bed mobility: Needs Assistance Bed Mobility: Supine to Sit;Sit to Supine     Supine to sit: Min assist Sit to supine: Supervision   General bed mobility comments: min assist for trunk support to ascend, returned to supine with supervision    Transfers Overall transfer level: Needs assistance Equipment used: 1 person hand held assist Transfers: Sit  to/from Stand Sit to Stand: Min guard         General transfer comment: for safety/balance    Balance Overall balance assessment: Needs assistance Sitting-balance support: No upper extremity supported;Feet supported Sitting balance-Leahy Scale: Fair     Standing balance support: Single extremity supported;During functional activity Standing balance-Leahy Scale: Fair Standing balance comment: preference to UE support                           ADL either performed or assessed with clinical judgement   ADL Overall ADL's : Needs assistance/impaired     Grooming: Min guard;Standing   Upper Body Bathing: Set up;Sitting   Lower Body Bathing: Min guard;Sit to/from stand   Upper Body Dressing : Set up;Sitting   Lower Body Dressing: Min guard;Sit to/from stand Lower Body Dressing Details (indicate cue type and reason): able to don socks, min guard sit to stand Toilet Transfer: Min guard;Ambulation   Toileting- Clothing Manipulation and Hygiene: Sit to/from stand;Supervision/safety       Functional mobility during ADLs: Min guard General ADL Comments: pt limited by L visual field loss and impaired balance     Vision Baseline Vision/History: Wears glasses Wears Glasses: Reading only Patient Visual Report: Peripheral vision impairment (L side) Vision Assessment?: Yes Eye Alignment: Within Functional Limits Ocular Range of Motion: Within Functional Limits Alignment/Gaze Preference: Within Defined Limits Tracking/Visual Pursuits: Able to track stimulus in all quads without difficulty Visual Fields: Left visual field deficit Additional Comments: educated patient on L sided visual field loss, loss past  midline.  Pt able to read menu with min cueing for scanning to L side. INitated education on compensatory techniques and head turns to locate items.     Perception     Praxis      Pertinent Vitals/Pain Pain Assessment: 0-10 Pain Score: 2  Pain Location: chornic  back pain Pain Descriptors / Indicators: Discomfort Pain Intervention(s): Limited activity within patient's tolerance;Monitored during session;Repositioned     Hand Dominance Right   Extremity/Trunk Assessment Upper Extremity Assessment Upper Extremity Assessment: Overall WFL for tasks assessed   Lower Extremity Assessment Lower Extremity Assessment: Defer to PT evaluation       Communication Communication Communication: No difficulties   Cognition Arousal/Alertness: Awake/alert Behavior During Therapy: WFL for tasks assessed/performed Overall Cognitive Status: Within Functional Limits for tasks assessed                                 General Comments: appears WFL, good awareness of L visual field deficit but requires cueing for safety due to vision   General Comments  VSS, pt with mild dizziness upon standing but resolves quickly    Exercises     Shoulder Instructions      Home Living Family/patient expects to be discharged to:: Private residence Living Arrangements: Spouse/significant other Available Help at Discharge: Family;Available PRN/intermittently Type of Home: House Home Access: Stairs to enter [employer] of Steps: 4 Entrance Stairs-Rails: Can reach both Home Layout: One level     Bathroom Shower/Tub: Occupational psychologist: Standard     Home Equipment: Cane - single point;Shower seat;Grab bars - tub/shower;Grab bars - toilet   Additional Comments: husband works out of town, daughter can assist if needed      Prior Functioning/Environment Level of Independence: Independent with assistive device(s)        Comments: uses cane for mobility at times        OT Problem List: Impaired balance (sitting and/or standing);Impaired vision/perception;Decreased safety awareness;Decreased knowledge of use of DME or AE;Decreased knowledge of precautions      OT Treatment/Interventions: Self-care/ADL  training;Neuromuscular education;DME and/or AE instruction;Therapeutic activities;Cognitive remediation/compensation;Visual/perceptual remediation/compensation;Patient/family education;Balance training    OT Goals(Current goals can be found in the care plan section) Acute Rehab OT Goals Patient Stated Goal: to see better OT Goal Formulation: With patient Time For Goal Achievement: 07/14/20 Potential to Achieve Goals: Good  OT Frequency: Min 2X/week   Barriers to D/C:            Co-evaluation              AM-PAC OT "6 Clicks" Daily Activity     Outcome Measure Help from another person eating meals?: None Help from another person taking care of personal grooming?: A Little Help from another person toileting, which includes using toliet, bedpan, or urinal?: A Little Help from another person bathing (including washing, rinsing, drying)?: A Little Help from another person to put on and taking off regular upper body clothing?: A Little Help from another person to put on and taking off regular lower body clothing?: A Little 6 Click Score: 19   End of Session Equipment Utilized During Treatment: Gait belt Nurse Communication: Mobility status  Activity Tolerance: Patient tolerated treatment well Patient left: in bed;with call bell/phone within reach;with bed alarm set  OT Visit Diagnosis: Other abnormalities of gait and mobility (R26.89);Other symptoms and signs involving the nervous system (R29.898)  Time: 4174-0814 OT Time Calculation (min): 30 min Charges:  OT General Charges $OT Visit: 1 Visit OT Evaluation $OT Eval Moderate Complexity: 1 Mod OT Treatments $Self Care/Home Management : 8-22 mins  Jolaine Artist, OT Acute Rehabilitation Services Pager [phone] Office [place] 06/30/2020, 11:15 AM

## 2020-06-30 NOTE — Progress Notes (Incomplete)
  Echocardiogram 2D Echocardiogram has been performed.  Cammy Brochure 06/30/2020, 11:25 AM

## 2020-06-30 NOTE — Plan of Care (Signed)

## 2020-06-30 NOTE — Progress Notes (Signed)
BLE venous duplex has been completed  Results can be found under chart review under CV PROC. 06/30/2020 2:43 PM Tyianna Menefee RVT, RDMS

## 2020-06-30 NOTE — Evaluation (Signed)
Physical Therapy Evaluation Patient Details Name: CHRISTABEL CAMIRE MRN: [id number] DOB: March 23, 1945 Today's Date: 06/30/2020   History of Present Illness  Pt is a 75 y/o female with PMH of DM, restless leg syndrome, chronic LBP, presenting to ER for visual field defciits on L side. 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  Clinical Impression  Pt admitted with above diagnosis. Pt presents with L visual field cut and although she can state that she has it, functionally, she is not attending to her deficit. Needs more work on compensating and learning to scan L visual field. 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 Husband words out of town, daughter can be with her in the evenings. Pt mobilizing at supervision level. Recommend outpatient PT to return to independence.   Pt currently with functional limitations due to the deficits listed below (see PT Problem List). Pt will benefit from skilled PT to increase their independence and safety with mobility to allow discharge to the venue listed below.       Follow Up Recommendations Outpatient PT    Equipment Recommendations  None recommended by PT    Recommendations for Other Services       Precautions / Restrictions Precautions Precautions: Fall Precaution Comments: L visual field cut Restrictions Weight Bearing Restrictions: No      Mobility  Bed Mobility Overal bed mobility: Needs Assistance Bed Mobility: Supine to Sit;Sit to Supine     Supine to sit: Supervision Sit to supine: Supervision   General bed mobility comments: pt able to complete bed mobility without physical assist, increased time needed as pt felt for L side of bed and cues needed to turn head a visualize L side    Transfers Overall transfer level: Needs assistance Equipment used: Straight cane Transfers: Sit to/from Stand Sit to Stand: Min guard         General transfer comment:  for safety as pt becomes unsteady when trying to scan L side  Ambulation/Gait Ambulation/Gait assistance: Min guard Gait Distance (Feet): 200 Feet Assistive device: Straight cane Gait Pattern/deviations: Step-through pattern;Staggering right Gait velocity: decreased Gait velocity interpretation: 1.31 - 2.62 ft/sec, indicative of limited community ambulator General Gait Details: had pt hold cane in her L hand (not her normal side) to increase attention to that side. vc's needed to scan L visual field. No physical assist for gait but oocasional R stagger as she looked L  Stairs Stairs: Yes Stairs assistance: Supervision Stair Management: One rail Right;Alternating pattern;Step to pattern;Forwards Number of Stairs: 10 General stair comments: to difficulties on stairs with use of rail  Wheelchair Mobility    Modified Rankin (Stroke Patients Only) Modified Rankin (Stroke Patients Only) Pre-Morbid Rankin Score: No symptoms Modified Rankin: Moderately severe disability     Balance Overall balance assessment: Needs assistance Sitting-balance support: No upper extremity supported;Feet supported Sitting balance-Leahy Scale: Good     Standing balance support: Single extremity supported;During functional activity Standing balance-Leahy Scale: Fair Standing balance comment: preference to UE support                             Pertinent Vitals/Pain Pain Assessment: 0-10 Pain Score: 2  Pain Location: chronic back pain Pain Descriptors / Indicators: Discomfort Pain Intervention(s): Limited activity within patient's tolerance;Monitored during session    Home Living Family/patient expects to be discharged to:: Private residence Living Arrangements: Spouse/significant other Available Help  at Discharge: Family;Available 24 hours/day Type of Home: House Home Access: Stairs to enter Entrance Stairs-Rails: Can reach both Entrance Stairs-Number of Steps: 4 Home Layout: One  level Home Equipment: Cane - single point;Shower seat;Grab bars - tub/shower;Grab bars - toilet Additional Comments: husband works out of town, daughter can assist if needed in the evenings. Husband can take off first few days that pt is home    Prior Function Level of Independence: Independent with assistive device(s)         Comments: uses cane for mobility at times due to LBP. Is an Information systems manager Dominance   Dominant Hand: Right    Extremity/Trunk Assessment   Upper Extremity Assessment Upper Extremity Assessment: Defer to OT evaluation    Lower Extremity Assessment Lower Extremity Assessment: Overall WFL for tasks assessed    Cervical / Trunk Assessment Cervical / Trunk Assessment: Normal  Communication   Communication: No difficulties  Cognition Arousal/Alertness: Awake/alert Behavior During Therapy: WFL for tasks assessed/performed Overall Cognitive Status: Within Functional Limits for tasks assessed                                 General Comments: appears WFL, good awareness of L visual field deficit but requires cueing for safety due to vision      General Comments General comments (skin integrity, edema, etc.): VSS    Exercises     Assessment/Plan    PT Assessment Patient needs continued PT services  PT Problem List Decreased balance;Decreased mobility       PT Treatment Interventions DME instruction;Gait training;Stair training;Functional mobility training;Therapeutic activities;Therapeutic exercise;Balance training;Patient/family education    PT Goals (Current goals can be found in the Care Plan section)  Acute Rehab PT Goals Patient Stated Goal: to see better PT Goal Formulation: With patient Time For Goal Achievement: 07/14/20 Potential to Achieve Goals: Good    Frequency Min 4X/week   Barriers to discharge Decreased caregiver support will be home alone during the day    Co-evaluation               AM-PAC PT "6  Clicks" Mobility  Outcome Measure Help needed turning from your back to your side while in a flat bed without using bedrails?: None Help needed moving from lying on your back to sitting on the side of a flat bed without using bedrails?: None Help needed moving to and from a bed to a chair (including a wheelchair)?: None Help needed standing up from a chair using your arms (e.g., wheelchair or bedside chair)?: A Little Help needed to walk in hospital room?: A Little Help needed climbing 3-5 steps with a railing? : A Little 6 Click Score: 21    End of Session Equipment Utilized During Treatment: Gait belt Activity Tolerance: Patient tolerated treatment well Patient left: in bed;with call bell/phone within reach;with bed alarm set Nurse Communication: Mobility status PT Visit Diagnosis: Unsteadiness on feet (R26.81)    Time: 0102-7253 PT Time Calculation (min) (ACUTE ONLY): 31 min   Charges:   PT Evaluation $PT Eval Moderate Complexity: 1 Mod PT Treatments $Gait Training: 8-22 mins        Leighton Roach, PT  Acute Rehab Services  Pager [phone] Office [place] 06/30/2020, 1:04 PM

## 2020-06-30 NOTE — Progress Notes (Signed)
STROKE TEAM PROGRESS NOTE   SUBJECTIVE (INTERVAL HISTORY) No family is at the bedside.  Overall her condition is stable. Still has left HH. She stated that after COVID in 11/2019 she had episode of heart palpitation at home lasting 46min and she was feeling aweful at that time and she just felt heart beating really fast. She then talked to her PCP and was told to have tachycardia but no monitoring done. She had later 2-3 episodes again at home but short lived. She said she has not had that recently.    OBJECTIVE Temp:  [98 F (36.7 C)-98.4 F (36.9 C)] (P) 98.3 F (36.8 C) (05/03 1216) Pulse Rate:  [49-127] (P) 61 (05/03 1216) Cardiac Rhythm: Normal sinus rhythm (05/03 0756) Resp:  [16-20] (P) 18 (05/03 1216) BP: (136-176)/(67-88) (P) 152/79 (05/03 1216) SpO2:  [72 %-100 %] (P) 91 % (05/03 0757)  Recent Labs  Lab 06/30/20 0611 06/30/20 1215  GLUCAP 110* 93   Recent Labs  Lab 06/29/20 1300 06/29/20 1543 06/30/20 0401  NA 138 141 140  K 3.6 3.6 3.2*  CL 101 100 101  CO2 31  --  30  GLUCOSE 146* 102* 103*  BUN 21 23 14   CREATININE 1.02* 1.00 0.73  CALCIUM 9.9  --  9.9   Recent Labs  Lab 06/29/20 1300 06/30/20 0401  AST 15 15  ALT 17 15  ALKPHOS 74 70  BILITOT 0.6 0.3  PROT 6.3* 5.8*  ALBUMIN 3.6 3.4*   Recent Labs  Lab 06/29/20 1300 06/29/20 1543 06/30/20 0401  WBC 5.8  --  5.7  NEUTROABS 3.5  --   --   HGB 11.4* 11.6* 10.9*  HCT 36.6 34.0* 33.8*  MCV 101.4*  --  99.1  PLT 215  --  198   No results for input(s): CKTOTAL, CKMB, CKMBINDEX, TROPONINI in the last 168 hours. Recent Labs    06/29/20 1300  LABPROT 12.6  INR 0.9   Recent Labs    06/29/20 1930  COLORURINE YELLOW  LABSPEC 1.016  PHURINE 7.0  GLUCOSEU NEGATIVE  HGBUR NEGATIVE  BILIRUBINUR NEGATIVE  KETONESUR NEGATIVE  PROTEINUR NEGATIVE  NITRITE POSITIVE*  LEUKOCYTESUR SMALL*       Component Value Date/Time   CHOL 276 (H) 06/30/2020 0401   TRIG 135 06/30/2020 0401   HDL 46  06/30/2020 0401   CHOLHDL 6.0 06/30/2020 0401   VLDL 27 06/30/2020 0401   LDLCALC 203 (H) 06/30/2020 0401   Lab Results  Component Value Date   HGBA1C 5.8 (H) 06/29/2020      Component Value Date/Time   LABOPIA NONE DETECTED 06/29/2020 1930   COCAINSCRNUR NONE DETECTED 06/29/2020 1930   LABBENZ NONE DETECTED 06/29/2020 1930   AMPHETMU NONE DETECTED 06/29/2020 1930   THCU NONE DETECTED 06/29/2020 1930   LABBARB NONE DETECTED 06/29/2020 1930    Recent Labs  Lab 06/29/20 1633  ETH <10    I have personally reviewed the radiological images below and agree with the radiology interpretations.  CT ANGIO HEAD W OR WO CONTRAST  Result Date: 06/29/2020 CLINICAL DATA:  PCA territory infarct EXAM: CT ANGIOGRAPHY HEAD AND NECK TECHNIQUE: Multidetector CT imaging of the head and neck was performed using the standard protocol during bolus administration of intravenous contrast. Multiplanar CT image reconstructions and MIPs were obtained to evaluate the vascular anatomy. Carotid stenosis measurements (when applicable) are obtained utilizing NASCET criteria, using the distal internal carotid diameter as the denominator. CONTRAST:  74mL OMNIPAQUE IOHEXOL 350 MG/ML SOLN COMPARISON:  Head CT 06/29/2020 FINDINGS: CTA NECK FINDINGS SKELETON: There is no bony spinal canal stenosis. No lytic or blastic lesion. OTHER NECK: Normal pharynx, larynx and major salivary glands. No cervical lymphadenopathy. Unremarkable thyroid gland. UPPER CHEST: No pneumothorax or pleural effusion. No nodules or masses. AORTIC ARCH: There is calcific atherosclerosis of the aortic arch. There is no aneurysm, dissection or hemodynamically significant stenosis of the visualized portion of the aorta. Conventional 3 vessel aortic branching pattern. The visualized proximal subclavian arteries are widely patent. RIGHT CAROTID SYSTEM: Normal without aneurysm, dissection or stenosis. LEFT CAROTID SYSTEM: Normal without aneurysm, dissection or  stenosis. VERTEBRAL ARTERIES: Left dominant configuration. Both origins are clearly patent. There is no dissection, occlusion or flow-limiting stenosis to the skull base (V1-V3 segments). CTA HEAD FINDINGS POSTERIOR CIRCULATION: --Vertebral arteries: Normal V4 segments. --Inferior cerebellar arteries: Normal. --Basilar artery: Normal. --Superior cerebellar arteries: Normal. 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 Left PCA is normal. ANTERIOR CIRCULATION: --Intracranial internal carotid arteries: Normal. --Anterior cerebral arteries (ACA): Normal. Both A1 segments are present. Patent anterior communicating artery (a-comm). --Middle cerebral arteries (MCA): Normal. VENOUS SINUSES: As permitted by contrast timing, patent. ANATOMIC VARIANTS: None Review of the MIP images confirms the above findings. IMPRESSION: 1. 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 2. No other intracranial arterial occlusion or high-grade stenosis. Aortic Atherosclerosis (ICD10-I70.0). Electronically Signed   By: Ulyses Jarred M.D.   On: 06/29/2020 22:03   CT Head Wo Contrast  Result Date: 06/29/2020 CLINICAL DATA:  Monocular vision loss. EXAM: CT HEAD WITHOUT CONTRAST TECHNIQUE: Contiguous axial images were obtained from the base of the skull through the vertex without intravenous contrast. COMPARISON:  None. FINDINGS: Brain: Brainstem and cerebellum are normal. Confluent low density is present in the right occipital lobe consistent with right PCA territory infarction. Mild swelling but no hemorrhage. No visible infarction of the right thalamus. Cerebral hemispheres show moderate chronic small-vessel changes of the white matter. No mass, hydrocephalus or extra-axial collection. Vascular: There is atherosclerotic calcification of the major vessels at the base of the brain. Skull: Negative Sinuses/Orbits: Clear/normal Other: None IMPRESSION: Acute infarction in the right occipital lobe with low-density  and mild swelling. No hemorrhage. Findings consistent with partial right PCA territory infarction. Chronic small-vessel ischemic changes elsewhere affecting the cerebral hemispheric white matter. Electronically Signed   By: Nelson Chimes M.D.   On: 06/29/2020 14:59   CT ANGIO NECK W OR WO CONTRAST  Result Date: 06/29/2020 CLINICAL DATA:  PCA territory infarct EXAM: CT ANGIOGRAPHY HEAD AND NECK TECHNIQUE: Multidetector CT imaging of the head and neck was performed using the standard protocol during bolus administration of intravenous contrast. Multiplanar CT image reconstructions and MIPs were obtained to evaluate the vascular anatomy. Carotid stenosis measurements (when applicable) are obtained utilizing NASCET criteria, using the distal internal carotid diameter as the denominator. CONTRAST:  83mL OMNIPAQUE IOHEXOL 350 MG/ML SOLN COMPARISON:  Head CT 06/29/2020 FINDINGS: CTA NECK FINDINGS SKELETON: There is no bony spinal canal stenosis. No lytic or blastic lesion. OTHER NECK: Normal pharynx, larynx and major salivary glands. No cervical lymphadenopathy. Unremarkable thyroid gland. UPPER CHEST: No pneumothorax or pleural effusion. No nodules or masses. AORTIC ARCH: There is calcific atherosclerosis of the aortic arch. There is no aneurysm, dissection or hemodynamically significant stenosis of the visualized portion of the aorta. Conventional 3 vessel aortic branching pattern. The visualized proximal subclavian arteries are widely patent. RIGHT CAROTID SYSTEM: Normal without aneurysm, dissection or stenosis. LEFT CAROTID SYSTEM: Normal without aneurysm,  dissection or stenosis. VERTEBRAL ARTERIES: Left dominant configuration. Both origins are clearly patent. There is no dissection, occlusion or flow-limiting stenosis to the skull base (V1-V3 segments). CTA HEAD FINDINGS POSTERIOR CIRCULATION: --Vertebral arteries: Normal V4 segments. --Inferior cerebellar arteries: Normal. --Basilar artery: Normal. --Superior  cerebellar arteries: Normal. 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 Left PCA is normal. ANTERIOR CIRCULATION: --Intracranial internal carotid arteries: Normal. --Anterior cerebral arteries (ACA): Normal. Both A1 segments are present. Patent anterior communicating artery (a-comm). --Middle cerebral arteries (MCA): Normal. VENOUS SINUSES: As permitted by contrast timing, patent. ANATOMIC VARIANTS: None Review of the MIP images confirms the above findings. IMPRESSION: 1. 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 2. No other intracranial arterial occlusion or high-grade stenosis. Aortic Atherosclerosis (ICD10-I70.0). Electronically Signed   By: Ulyses Jarred M.D.   On: 06/29/2020 22:03   MR BRAIN WO CONTRAST  Result Date: 06/29/2020 CLINICAL DATA:  Neuro deficit, acute, stroke suspected. Additional history provided: Patient reports vision loss in left eye. EXAM: MRI HEAD WITHOUT CONTRAST TECHNIQUE: Multiplanar, multiecho pulse sequences of the brain and surrounding structures were obtained without intravenous contrast. COMPARISON:  Noncontrast head CT performed earlier today 06/29/2020. FINDINGS: Brain: Mild intermittent motion degradation. Mild cerebral and cerebellar atrophy. Region of cortical/subcortical restricted diffusion and T2/FLAIR hyperintense signal abnormality within the posteromedial right temporal lobe and right occipital lobe, measuring 6.0 x 3.2 x 3.4 cm (AP x TV x CC). Findings are compatible with acute/early subacute right PCA territory infarction. Acute/early subacute infarction changes are also present more anteriorly within the right hippocampus (also right PCA territory). Local mass effect with partial effacement of the right lateral ventricle occipital horn. No midline shift. No evidence of hemorrhagic conversion. Background moderate multifocal T2/FLAIR hyperintensity within the cerebral white matter, nonspecific but compatible with chronic small  vessel ischemic disease. Multiple T2 hyperintense foci within the deep gray nuclei likely reflecting a combination of chronic lacunar infarcts and prominent perivascular spaces. Subcentimeter chronic infarct within the right cerebellar hemisphere (series 5, image 6). No evidence of intracranial mass. No chronic intracranial blood products. No extra-axial fluid collection. No midline shift. Vascular: No definite loss of expected proximal arterial flow voids. 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 Sinuses/Orbits: Visualized orbits show no acute finding. Prior bilateral lens replacement. Trace bilateral ethmoid sinus mucosal thickening. Mild left maxillary sinus mucosal thickening. IMPRESSION: Acute/early subacute cortical and subcortical right PCA territory infarct affecting the right hippocampus, posteromedial right temporal lobe and right occipital lobe as described. Local mass effect with partial effacement of the right lateral ventricle occipital horn. No midline shift. No evidence of hemorrhagic conversion. 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 Background moderate cerebral white matter chronic small vessel ischemic disease. Subcentimeter chronic infarct within the right cerebellar hemisphere. Mild generalized parenchymal atrophy. Electronically Signed   By: Kellie Simmering DO   On: 06/29/2020 15:46   ECHOCARDIOGRAM COMPLETE BUBBLE STUDY  Result Date: 06/30/2020    ECHOCARDIOGRAM REPORT   Patient Name:   AJEE HEASLEY [facility] Date of Exam: 06/30/2020 Medical Rec #:  [id number]        Height:       65.0 in Accession #:    [id number]       Weight:       219.6 lb Date of Birth:  [birth year]  BSA:          2.058 m Patient Age:    75 years         BP:           145/76 mmHg Patient Gender: F                HR:           68 bpm. Exam  Location:  Inpatient Procedure: 2D Echo, Cardiac Doppler and Color Doppler Indications:    Stroke  History:        Patient has no prior history of Echocardiogram examinations.                 Stroke; Risk Factors:Former Smoker and Hypertension.  Sonographer:    Cammy Brochure Referring Phys: [id number] [place]  1. Left ventricular ejection fraction, by estimation, is 60 to 65%. The left ventricle has normal function. The left ventricle has no regional wall motion abnormalities. There is mild left ventricular hypertrophy. Left ventricular diastolic parameters are consistent with Grade I diastolic dysfunction (impaired relaxation).  2. Right ventricular systolic function is normal. The right ventricular size is normal.  3. The mitral valve is normal in structure. Trivial mitral valve regurgitation. No evidence of mitral stenosis.  4. The aortic valve is tricuspid. Aortic valve regurgitation is not visualized. No aortic stenosis is present.  5. Aortic dilatation noted. There is moderate dilatation of the ascending aorta, measuring 46 mm.  6. The inferior vena cava is normal in size with greater than 50% respiratory variability, suggesting right atrial pressure of 3 mmHg.  7. Agitated saline contrast bubble study was negative, with no evidence of any interatrial shunt. Conclusion(s)/Recommendation(s): No intracardiac source of embolism detected on this transthoracic study. A transesophageal echocardiogram is recommended to exclude cardiac source of embolism if clinically indicated. FINDINGS  Left Ventricle: Left ventricular ejection fraction, by estimation, is 60 to 65%. The left ventricle has normal function. The left ventricle has no regional wall motion abnormalities. The left ventricular internal cavity size was normal in size. There is  mild left ventricular hypertrophy. Left ventricular diastolic parameters are consistent with Grade I diastolic dysfunction (impaired relaxation). Right Ventricle:  The right ventricular size is normal. No increase in right ventricular wall thickness. Right ventricular systolic function is normal. Left Atrium: Left atrial size was normal in size. Right Atrium: Right atrial size was normal in size. Pericardium: There is no evidence of pericardial effusion. Mitral Valve: The mitral valve is normal in structure. Trivial mitral valve regurgitation. No evidence of mitral valve stenosis. Tricuspid Valve: The tricuspid valve is normal in structure. Tricuspid valve regurgitation is not demonstrated. No evidence of tricuspid stenosis. Aortic Valve: The aortic valve is tricuspid. Aortic valve regurgitation is not visualized. No aortic stenosis is present. Aortic valve mean gradient measures 6.0 mmHg. Aortic valve peak gradient measures 11.2 mmHg. Aortic valve area, by VTI measures 1.89  cm. Pulmonic Valve: The pulmonic valve was normal in structure. Pulmonic valve regurgitation is not visualized. No evidence of pulmonic stenosis. Aorta: Aortic dilatation noted. There is moderate dilatation of the ascending aorta, measuring 46 mm. Venous: The inferior vena cava is normal in size with greater than 50% respiratory variability, suggesting right atrial pressure of 3 mmHg. IAS/Shunts: No atrial level shunt detected by color flow Doppler. Agitated saline contrast was given intravenously to evaluate for intracardiac shunting. Agitated saline contrast bubble study was negative, with no evidence of any interatrial shunt.  LEFT  VENTRICLE PLAX 2D LVIDd:         4.20 cm     Diastology LVIDs:         2.90 cm     LV e' medial:    6.53 cm/s LV PW:         1.30 cm     LV E/e' medial:  12.4 LV IVS:        1.30 cm     LV e' lateral:   7.40 cm/s LVOT diam:     1.90 cm     LV E/e' lateral: 10.9 LV SV:         67 LV SV Index:   33 LVOT Area:     2.84 cm  LV Volumes (MOD) LV vol d, MOD A2C: 83.4 ml LV vol d, MOD A4C: 83.1 ml LV vol s, MOD A2C: 32.2 ml LV vol s, MOD A4C: 32.7 ml LV SV MOD A2C:     51.2 ml LV  SV MOD A4C:     83.1 ml LV SV MOD BP:      51.1 ml RIGHT VENTRICLE             IVC RV S prime:     17.00 cm/s  IVC diam: 1.40 cm TAPSE (M-mode): 2.2 cm LEFT ATRIUM             Index       RIGHT ATRIUM           Index LA diam:        2.80 cm 1.36 cm/m  RA Area:     10.50 cm LA Vol (A2C):   52.8 ml 25.65 ml/m RA Volume:   16.70 ml  8.11 ml/m LA Vol (A4C):   56.1 ml 27.26 ml/m LA Biplane Vol: 55.2 ml 26.82 ml/m  AORTIC VALVE AV Area (Vmax):    2.00 cm AV Area (Vmean):   1.95 cm AV Area (VTI):     1.89 cm AV Vmax:           167.00 cm/s AV Vmean:          118.000 cm/s AV VTI:            0.356 m AV Peak Grad:      11.2 mmHg AV Mean Grad:      6.0 mmHg LVOT Vmax:         118.00 cm/s LVOT Vmean:        81.300 cm/s LVOT VTI:          0.237 m LVOT/AV VTI ratio: 0.67  AORTA Ao Root diam: 3.20 cm MITRAL VALVE MV Area (PHT): 4.06 cm    SHUNTS MV Decel Time: 187 msec    Systemic VTI:  0.24 m MV E velocity: 81.00 cm/s  Systemic Diam: 1.90 cm MV A velocity: 98.50 cm/s MV E/A ratio:  0.82 Candee Furbish MD Electronically signed by Candee Furbish MD Signature Date/Time: 06/30/2020/12:23:41 PM    Final     PHYSICAL EXAM  Temp:  [98 F (36.7 C)-98.4 F (36.9 C)] (P) 98.3 F (36.8 C) (05/03 1216) Pulse Rate:  [49-127] (P) 61 (05/03 1216) Resp:  [16-20] (P) 18 (05/03 1216) BP: (136-176)/(67-88) (P) 152/79 (05/03 1216) SpO2:  [72 %-100 %] (P) 91 % (05/03 0757)  General - Well nourished, well developed, in no apparent distress.  Ophthalmologic - fundi not visualized due to noncooperation.  Cardiovascular - Regular rhythm and rate.  Mental Status -  Level of arousal and  orientation to time, place, and person were intact. Language including expression, naming, repetition, comprehension was assessed and found intact. Fund of Knowledge was assessed and was intact.  Cranial Nerves II - XII - II - Visual field left hemianopia III, IV, VI - Extraocular movements intact. V - Facial sensation intact bilaterally. VII -  Facial movement intact bilaterally. VIII - Hearing & vestibular intact bilaterally. X - Palate elevates symmetrically. XI - Chin turning & shoulder shrug intact bilaterally. XII - Tongue protrusion intact.  Motor Strength - The patient's strength was normal in all extremities and pronator drift was absent.  Bulk was normal and fasciculations were absent.   Motor Tone - Muscle tone was assessed at the neck and appendages and was normal.  Reflexes - The patient's reflexes were symmetrical in all extremities and she had no pathological reflexes.  Sensory - Light touch, temperature/pinprick were assessed and were symmetrical.    Coordination - The patient had normal movements in the hands and feet with no ataxia or dysmetria.  Tremor was absent.  Gait and Station - deferred.   ASSESSMENT/PLAN Ms. Rhonda Winters is a 75 y.o. female with history of DM, RLS, COVID 11/2019 admitted for left HH. No tPA given due to outside window.    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   Patient counseled to be compliant with her antithrombotic medications  Ongoing aggressive stroke risk factor management  Therapy recommendations:  Pending   Disposition:  Pending   Heart palpitation  2-3 brief episodes of heart palpitation / tachycardia since 11/2019  Lasted less than 60min  Concerning for PAF in the setting of current embolic stroke  Cardiology EP on board, will see her  tomorrow  Diabetes  HgbA1c 5.8 goal < 7.0  Controlled  Currently on lantus  CBG monitoring  SSI  DM education and close PCP follow up  Hypertension . Stable . On HCTZ  Long term BP goal normotensive  Hyperlipidemia  Home meds:  none   LDL 203, goal < 70  Now on lipitor 80  Continue statin at discharge  Other Stroke Risk Factors  Advanced age  Other Active Problems  RLS  COVID 11/2019  Hospital day # 0    Rosalin Hawking, MD PhD Stroke Neurology 06/30/2020 12:51 PM    To contact Stroke Continuity provider, please refer to [email or web address] After hours, contact General Neurology

## 2020-06-30 NOTE — Progress Notes (Signed)
SLP Cancellation Note  Patient Details Name: Rhonda Winters MRN: [id number] DOB: [birth year]   Cancelled treatment:       Reason Eval/Treat Not Completed: SLP screened, no needs identified for swallowing. Pt passed the [facility] swallow screen on previous date and is on a diet of unrestricted textures. RN says she's is doing well with this diet this morning. Per protocol, will hold SLP swallowing evaluation; however, would consider ordering SLP cognitive-linguistic evaluation in light of acute vs early subacute infarct on MRI.    Osie Bond., M.A. [facility] Pager [phone] Office [phone]  06/30/2020, 9:30 AM

## 2020-06-30 NOTE — Progress Notes (Signed)
Pt passed swallow test without any complications.

## 2020-06-30 NOTE — TOC Initial Note (Signed)
Transition of Care [facility]) - Initial/Assessment Note    Patient Details  Name: Rhonda Winters MRN: [id number] Date of Birth: 12/01/1945  Transition of Care [facility]) CM/SW Contact:    Pollie Friar, RN Phone Number: 06/30/2020, 1:33 PM  Clinical Narrative:                 Patient with recommendations for outpatient therapy. She has moved to [place] and would like to attend [employer]. Orders in Epic and information on the AVS. PCP: Dr Roderick Pee in [place] Pt state she will have needed transportation. She denies issues with home meds.  TOC following.  Expected Discharge Plan: OP Rehab Barriers to Discharge: Continued Medical Work up   Patient Goals and CMS Choice     Choice offered to / list presented to : Patient  Expected Discharge Plan and Services Expected Discharge Plan: OP Rehab   Discharge Planning Services: CM Consult   Living arrangements for the past 2 months: Single Family Home                                      Prior Living Arrangements/Services Living arrangements for the past 2 months: Single Family Home Lives with:: Spouse Patient language and need for interpreter reviewed:: Yes Do you feel safe going back to the place where you live?: Yes          Current home services: DME (cane/ walker) Criminal Activity/Legal Involvement Pertinent to Current Situation/Hospitalization: No - Comment as needed  Activities of Daily Living Home Assistive Devices/Equipment: None ADL Screening (condition at time of admission) Patient's cognitive ability adequate to safely complete daily activities?: Yes Is the patient deaf or have difficulty hearing?: No Does the patient have difficulty seeing, even when wearing glasses/contacts?: Yes Does the patient have difficulty concentrating, remembering, or making decisions?: No Patient able to express need for assistance with ADLs?: Yes Does the patient have difficulty dressing or bathing?:  Yes Independently performs ADLs?: Yes (appropriate for developmental age) Communication: Independent Dressing (OT): Independent Grooming: Independent Feeding: Independent Bathing: Needs assistance Is this a change from baseline?: Change from baseline, expected to last <3 days Toileting: Appropriate for developmental age In/Out Bed: Needs assistance Is this a change from baseline?: Change from baseline, expected to last <3 days Walks in Home: Independent Weakness of Legs: None Weakness of Arms/Hands: None  Permission Sought/Granted                  Emotional Assessment Appearance:: Appears stated age Attitude/Demeanor/Rapport: Engaged Affect (typically observed): Accepting Orientation: : Oriented to Self,Oriented to Place,Oriented to  Time,Oriented to Situation   Psych Involvement: No (comment)  Admission diagnosis:  Stroke [facility]) [I63.9] Patient Active Problem List   Diagnosis Date Noted  . Stroke ([place]) 06/29/2020  . Acute hypoxemic respiratory failure due to COVID-19 ([place]) 12/26/2019  . HTN (hypertension) 12/26/2019  . Diabetic neuropathy associated with type 2 diabetes mellitus ([place]) 12/26/2019  . Chronic back pain 12/26/2019  . T2DM (type 2 diabetes mellitus) ([place]) 12/26/2019  . Hypokalemia 12/26/2019  . Elevated LFTs 12/26/2019  . Hypothyroidism 12/26/2019   PCP:  Patient, No Pcp Per (Inactive) Pharmacy:   [facility] [address] [place] - [place] [address] Phone: [phone] Fax: [phone]     Social Determinants of Health (SDOH) Interventions    Readmission Risk Interventions No flowsheet data found.

## 2020-07-01 ENCOUNTER — Encounter (HOSPITAL_COMMUNITY)
Admission: EM | Disposition: A | Discharge status: Home Health | Payer: Self-pay | Source: Home / Self Care | Attending: Internal Medicine

## 2020-07-01 DIAGNOSIS — I639 Cerebral infarction, unspecified: Secondary | ICD-10-CM

## 2020-07-01 DIAGNOSIS — E785 Hyperlipidemia, unspecified: Secondary | ICD-10-CM

## 2020-07-01 HISTORY — PX: LOOP RECORDER INSERTION: EP1214

## 2020-07-01 LAB — CBC
HCT: 39.6 % (ref 36.0–46.0)
Hemoglobin: 12.6 g/dL (ref 12.0–15.0)
MCH: 31.5 pg (ref 26.0–34.0)
MCHC: 31.8 g/dL (ref 30.0–36.0)
MCV: 99 fL (ref 80.0–100.0)
Platelets: 216 10*3/uL (ref 150–400)
RBC: 4 MIL/uL (ref 3.87–5.11)
RDW: 14.6 % (ref 11.5–15.5)
WBC: 10.3 10*3/uL (ref 4.0–10.5)
nRBC: 0 % (ref 0.0–0.2)

## 2020-07-01 LAB — BASIC METABOLIC PANEL
Anion gap: 10 (ref 5–15)
BUN: 18 mg/dL (ref 8–23)
CO2: 28 mmol/L (ref 22–32)
Calcium: 10.5 mg/dL — ABNORMAL HIGH (ref 8.9–10.3)
Chloride: 101 mmol/L (ref 98–111)
Creatinine, Ser: 0.75 mg/dL (ref 0.44–1.00)
GFR, Estimated: >60 mL/min (ref >60)
Glucose, Bld: 123 mg/dL — ABNORMAL HIGH (ref 70–99)
Potassium: 3.8 mmol/L (ref 3.5–5.1)
Sodium: 139 mmol/L (ref 135–145)

## 2020-07-01 LAB — GLUCOSE, CAPILLARY
Glucose-Capillary: 102 mg/dL — ABNORMAL HIGH (ref 70–99)
Glucose-Capillary: 89 mg/dL (ref 70–99)
Glucose-Capillary: 89 mg/dL (ref 70–99)

## 2020-07-01 SURGERY — LOOP RECORDER INSERTION

## 2020-07-01 MED ORDER — LOSARTAN POTASSIUM 50 MG PO TABS: 50.0000 mg | ORAL_TABLET | Freq: Every day | ORAL | 0 refills | Status: DC

## 2020-07-01 MED ORDER — ATORVASTATIN CALCIUM 80 MG PO TABS: 80.0000 mg | ORAL_TABLET | Freq: Every day | ORAL | 0 refills | Status: DC

## 2020-07-01 MED ORDER — LIDOCAINE-EPINEPHRINE 1 %-1:100000 IJ SOLN
INTRAMUSCULAR | Status: DC | PRN
  Administered 2020-07-01: 20 mL

## 2020-07-01 MED ORDER — CLOPIDOGREL BISULFATE 75 MG PO TABS: 75.0000 mg | ORAL_TABLET | Freq: Every day | ORAL | 0 refills | Status: DC

## 2020-07-01 MED ORDER — AMLODIPINE BESYLATE 5 MG PO TABS: 5.0000 mg | ORAL_TABLET | Freq: Every day | ORAL | Status: DC

## 2020-07-01 MED ORDER — ASPIRIN 325 MG PO TABS: 325.0000 mg | ORAL_TABLET | Freq: Every day | ORAL | 0 refills | Status: DC

## 2020-07-01 MED ORDER — LIDOCAINE-EPINEPHRINE 1 %-1:100000 IJ SOLN
INTRAMUSCULAR | Status: AC
  Filled 2020-07-01: qty 1

## 2020-07-01 SURGICAL SUPPLY — 2 items
MONITOR REVEAL LINQ II (Prosthesis & Implant Heart) ×2 IMPLANT
PACK LOOP INSERTION (CUSTOM PROCEDURE TRAY) ×2 IMPLANT

## 2020-07-01 NOTE — Hospital Course (Signed)
She will get update from PA later. Understands plan. Ready to go home today. Husband will pick her up. States her husband has been dealing with health problems recently that has caused her some distress; states he has Parkinson's. Back hurts today, nothing new, tylenol is helping the pain. L eye remains hard to see out of; did not see team standing to her left side. She is optimistic about getting used to it. She has PCP at [facility] near [facility](?).

## 2020-07-01 NOTE — Evaluation (Signed)
Speech Language Pathology Evaluation Patient Details Name: Rhonda Winters MRN: [id number] DOB: 01-01-1946 Today's Date: 07/01/2020 Time: 8338-2505 SLP Time Calculation (min) (ACUTE ONLY): 16.67 min  Problem List:  Patient Active Problem List   Diagnosis Date Noted  . CVA (cerebral vascular accident) ([place]) 06/30/2020  . Stroke ([place]) 06/29/2020  . Acute hypoxemic respiratory failure due to COVID-19 ([place]) 12/26/2019  . HTN (hypertension) 12/26/2019  . Diabetic neuropathy associated with type 2 diabetes mellitus ([place]) 12/26/2019  . Chronic back pain 12/26/2019  . T2DM (type 2 diabetes mellitus) ([place]) 12/26/2019  . Hypokalemia 12/26/2019  . Elevated LFTs 12/26/2019  . Hypothyroidism 12/26/2019   Past Medical History: History reviewed. No pertinent past medical history. Past Surgical History: History reviewed. No pertinent surgical history. HPI:  Pt is a 75 y/o female with PMH of DM, restless leg syndrome, chronic LBP, presenting to the ED for visual field defcits on L side. MRI showed large R PCA territory subacute infarct affecting R hippocampus, posterior meter R temporal lobe and R occipital lobe.   Assessment / Plan / Recommendation Clinical Impression  Pt participated in speech/language/cognition evaluation. Pt reported that she currently resides with her husband who is a Software engineer and manages her medications. She denied any baseline or acute deficits in speech, language, or cognition. The [facility] Mental Status Examination was completed to evaluate the pt's cognitive-linguistic skills. She achieved a score of 27/30 which is within the normal limits of 27 or more out of 30. Pt lost one point during delayed recall and two points during the clock drawing due to the clock hands being misaligned; however, SLP questions the impact of her visual impairments on her ability to accurately complete this task. No speech or language deficits were noted and her performance on informal  cognitive-linguistic tasks was within functional limits. Further skilled SLP services are not clinically indicated at this time. Pt and her nurse were educated regarding this and both parties verbalized understanding as well as agreement with plan of care.    SLP Assessment  SLP Recommendation/Assessment: Patient does not need any further Speech Lanaguage Pathology Services SLP Visit Diagnosis: Cognitive communication deficit (R41.841)    Follow Up Recommendations  None    Frequency and Duration           SLP Evaluation Cognition  Overall Cognitive Status: Within Functional Limits for tasks assessed Arousal/Alertness: Awake/alert Orientation Level: Oriented X4 Attention: Focused;Sustained Focused Attention: Appears intact Sustained Attention: Appears intact Memory: Impaired Memory Impairment: Retrieval deficit;Decreased recall of new information (Immediate: 5/5; delayed: 4/5; with choice cues: 1/1) Awareness: Appears intact Problem Solving: Appears intact Executive Function: Sequencing;Organizing Sequencing: Appears intact (Backward digit span: 3/3) Organizing: Appears intact (Clock drawing: 2/4)       Comprehension  Auditory Comprehension Overall Auditory Comprehension: Appears within functional limits for tasks assessed Commands: Within Functional Limits Conversation: Complex Reading Comprehension Reading Status: Within funtional limits    Expression Expression Primary Mode of Expression: Verbal Verbal Expression Overall Verbal Expression: Appears within functional limits for tasks assessed Level of Generative/Spontaneous Verbalization: Conversation Repetition: No impairment Naming: No impairment   Oral / Motor  Oral Motor/Sensory Function Overall Oral Motor/Sensory Function: Within functional limits Motor Speech Overall Motor Speech: Appears within functional limits for tasks assessed Respiration: Within functional limits Phonation: Normal Resonance: Within  functional limits Articulation: Within functional limitis Intelligibility: Intelligible Motor Speech Errors: Not applicable   Rhydian Baldi I. Hardin Negus, [place], [place] Office number [phone] Pager [phone]  Horton Marshall 07/01/2020, 11:51 AM

## 2020-07-01 NOTE — Progress Notes (Signed)
STROKE TEAM PROGRESS NOTE   SUBJECTIVE (INTERVAL HISTORY) No family is at the bedside.  Patient walking in the room with walker to go to bathroom.  Received loop recorder this afternoon.   OBJECTIVE Temp:  [97.6 F (36.4 C)-98 F (36.7 C)] 97.6 F (36.4 C) (05/04 1500) Pulse Rate:  [62-70] 69 (05/04 1500) Cardiac Rhythm: Heart block (05/04 0752) Resp:  [15-18] 16 (05/04 1500) BP: (154-181)/(70-90) 160/90 (05/04 1500) SpO2:  [93 %-99 %] 95 % (05/04 1500)  Recent Labs  Lab 06/30/20 1633 06/30/20 2142 07/01/20 0823 07/01/20 1157 07/01/20 1533  GLUCAP 106* 134* 102* 89 89   Recent Labs  Lab 06/29/20 1300 06/29/20 1543 06/30/20 0401 07/01/20 1511  NA 138 141 140 139  K 3.6 3.6 3.2* 3.8  CL 101 100 101 101  CO2 31  --  30 28  GLUCOSE 146* 102* 103* 123*  BUN 21 23 14 18   CREATININE 1.02* 1.00 0.73 0.75  CALCIUM 9.9  --  9.9 10.5*   Recent Labs  Lab 06/29/20 1300 06/30/20 0401  AST 15 15  ALT 17 15  ALKPHOS 74 70  BILITOT 0.6 0.3  PROT 6.3* 5.8*  ALBUMIN 3.6 3.4*   Recent Labs  Lab 06/29/20 1300 06/29/20 1543 06/30/20 0401 07/01/20 1511  WBC 5.8  --  5.7 10.3  NEUTROABS 3.5  --   --   --   HGB 11.4* 11.6* 10.9* 12.6  HCT 36.6 34.0* 33.8* 39.6  MCV 101.4*  --  99.1 99.0  PLT 215  --  198 216   No results for input(s): CKTOTAL, CKMB, CKMBINDEX, TROPONINI in the last 168 hours. Recent Labs    06/29/20 1300  LABPROT 12.6  INR 0.9   Recent Labs    06/29/20 1930  COLORURINE YELLOW  LABSPEC 1.016  PHURINE 7.0  GLUCOSEU NEGATIVE  HGBUR NEGATIVE  BILIRUBINUR NEGATIVE  KETONESUR NEGATIVE  PROTEINUR NEGATIVE  NITRITE POSITIVE*  LEUKOCYTESUR SMALL*       Component Value Date/Time   CHOL 276 (H) 06/30/2020 0401   TRIG 135 06/30/2020 0401   HDL 46 06/30/2020 0401   CHOLHDL 6.0 06/30/2020 0401   VLDL 27 06/30/2020 0401   LDLCALC 203 (H) 06/30/2020 0401   Lab Results  Component Value Date   HGBA1C 5.8 (H) 06/29/2020      Component Value  Date/Time   LABOPIA NONE DETECTED 06/29/2020 1930   COCAINSCRNUR NONE DETECTED 06/29/2020 1930   LABBENZ NONE DETECTED 06/29/2020 1930   AMPHETMU NONE DETECTED 06/29/2020 1930   THCU NONE DETECTED 06/29/2020 1930   LABBARB NONE DETECTED 06/29/2020 1930    Recent Labs  Lab 06/29/20 1633  ETH <10    I have personally reviewed the radiological images below and agree with the radiology interpretations.  CT ANGIO HEAD W OR WO CONTRAST  Result Date: 06/29/2020 CLINICAL DATA:  PCA territory infarct EXAM: CT ANGIOGRAPHY HEAD AND NECK TECHNIQUE: Multidetector CT imaging of the head and neck was performed using the standard protocol during bolus administration of intravenous contrast. Multiplanar CT image reconstructions and MIPs were obtained to evaluate the vascular anatomy. Carotid stenosis measurements (when applicable) are obtained utilizing NASCET criteria, using the distal internal carotid diameter as the denominator. CONTRAST:  73mL OMNIPAQUE IOHEXOL 350 MG/ML SOLN COMPARISON:  Head CT 06/29/2020 FINDINGS: CTA NECK FINDINGS SKELETON: There is no bony spinal canal stenosis. No lytic or blastic lesion. OTHER NECK: Normal pharynx, larynx and major salivary glands. No cervical lymphadenopathy. Unremarkable thyroid gland. UPPER CHEST:  No pneumothorax or pleural effusion. No nodules or masses. AORTIC ARCH: There is calcific atherosclerosis of the aortic arch. There is no aneurysm, dissection or hemodynamically significant stenosis of the visualized portion of the aorta. Conventional 3 vessel aortic branching pattern. The visualized proximal subclavian arteries are widely patent. RIGHT CAROTID SYSTEM: Normal without aneurysm, dissection or stenosis. LEFT CAROTID SYSTEM: Normal without aneurysm, dissection or stenosis. VERTEBRAL ARTERIES: Left dominant configuration. Both origins are clearly patent. There is no dissection, occlusion or flow-limiting stenosis to the skull base (V1-V3 segments). CTA HEAD  FINDINGS POSTERIOR CIRCULATION: --Vertebral arteries: Normal V4 segments. --Inferior cerebellar arteries: Normal. --Basilar artery: Normal. --Superior cerebellar arteries: Normal. 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 Left PCA is normal. ANTERIOR CIRCULATION: --Intracranial internal carotid arteries: Normal. --Anterior cerebral arteries (ACA): Normal. Both A1 segments are present. Patent anterior communicating artery (a-comm). --Middle cerebral arteries (MCA): Normal. VENOUS SINUSES: As permitted by contrast timing, patent. ANATOMIC VARIANTS: None Review of the MIP images confirms the above findings. IMPRESSION: 1. 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 2. No other intracranial arterial occlusion or high-grade stenosis. Aortic Atherosclerosis (ICD10-I70.0). Electronically Signed   By: Ulyses Jarred M.D.   On: 06/29/2020 22:03   CT Head Wo Contrast  Result Date: 06/29/2020 CLINICAL DATA:  Monocular vision loss. EXAM: CT HEAD WITHOUT CONTRAST TECHNIQUE: Contiguous axial images were obtained from the base of the skull through the vertex without intravenous contrast. COMPARISON:  None. FINDINGS: Brain: Brainstem and cerebellum are normal. Confluent low density is present in the right occipital lobe consistent with right PCA territory infarction. Mild swelling but no hemorrhage. No visible infarction of the right thalamus. Cerebral hemispheres show moderate chronic small-vessel changes of the white matter. No mass, hydrocephalus or extra-axial collection. Vascular: There is atherosclerotic calcification of the major vessels at the base of the brain. Skull: Negative Sinuses/Orbits: Clear/normal Other: None IMPRESSION: Acute infarction in the right occipital lobe with low-density and mild swelling. No hemorrhage. Findings consistent with partial right PCA territory infarction. Chronic small-vessel ischemic changes elsewhere affecting the cerebral hemispheric white matter.  Electronically Signed   By: Nelson Chimes M.D.   On: 06/29/2020 14:59   CT ANGIO NECK W OR WO CONTRAST  Result Date: 06/29/2020 CLINICAL DATA:  PCA territory infarct EXAM: CT ANGIOGRAPHY HEAD AND NECK TECHNIQUE: Multidetector CT imaging of the head and neck was performed using the standard protocol during bolus administration of intravenous contrast. Multiplanar CT image reconstructions and MIPs were obtained to evaluate the vascular anatomy. Carotid stenosis measurements (when applicable) are obtained utilizing NASCET criteria, using the distal internal carotid diameter as the denominator. CONTRAST:  68mL OMNIPAQUE IOHEXOL 350 MG/ML SOLN COMPARISON:  Head CT 06/29/2020 FINDINGS: CTA NECK FINDINGS SKELETON: There is no bony spinal canal stenosis. No lytic or blastic lesion. OTHER NECK: Normal pharynx, larynx and major salivary glands. No cervical lymphadenopathy. Unremarkable thyroid gland. UPPER CHEST: No pneumothorax or pleural effusion. No nodules or masses. AORTIC ARCH: There is calcific atherosclerosis of the aortic arch. There is no aneurysm, dissection or hemodynamically significant stenosis of the visualized portion of the aorta. Conventional 3 vessel aortic branching pattern. The visualized proximal subclavian arteries are widely patent. RIGHT CAROTID SYSTEM: Normal without aneurysm, dissection or stenosis. LEFT CAROTID SYSTEM: Normal without aneurysm, dissection or stenosis. VERTEBRAL ARTERIES: Left dominant configuration. Both origins are clearly patent. There is no dissection, occlusion or flow-limiting stenosis to the skull base (V1-V3 segments). CTA HEAD FINDINGS POSTERIOR CIRCULATION: --Vertebral arteries: Normal V4 segments. --  Inferior cerebellar arteries: Normal. --Basilar artery: Normal. --Superior cerebellar arteries: Normal. 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 Left PCA is normal. ANTERIOR CIRCULATION: --Intracranial internal carotid arteries: Normal.  --Anterior cerebral arteries (ACA): Normal. Both A1 segments are present. Patent anterior communicating artery (a-comm). --Middle cerebral arteries (MCA): Normal. VENOUS SINUSES: As permitted by contrast timing, patent. ANATOMIC VARIANTS: None Review of the MIP images confirms the above findings. IMPRESSION: 1. 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 2. No other intracranial arterial occlusion or high-grade stenosis. Aortic Atherosclerosis (ICD10-I70.0). Electronically Signed   By: Ulyses Jarred M.D.   On: 06/29/2020 22:03   MR BRAIN WO CONTRAST  Result Date: 06/29/2020 CLINICAL DATA:  Neuro deficit, acute, stroke suspected. Additional history provided: Patient reports vision loss in left eye. EXAM: MRI HEAD WITHOUT CONTRAST TECHNIQUE: Multiplanar, multiecho pulse sequences of the brain and surrounding structures were obtained without intravenous contrast. COMPARISON:  Noncontrast head CT performed earlier today 06/29/2020. FINDINGS: Brain: Mild intermittent motion degradation. Mild cerebral and cerebellar atrophy. Region of cortical/subcortical restricted diffusion and T2/FLAIR hyperintense signal abnormality within the posteromedial right temporal lobe and right occipital lobe, measuring 6.0 x 3.2 x 3.4 cm (AP x TV x CC). Findings are compatible with acute/early subacute right PCA territory infarction. Acute/early subacute infarction changes are also present more anteriorly within the right hippocampus (also right PCA territory). Local mass effect with partial effacement of the right lateral ventricle occipital horn. No midline shift. No evidence of hemorrhagic conversion. Background moderate multifocal T2/FLAIR hyperintensity within the cerebral white matter, nonspecific but compatible with chronic small vessel ischemic disease. Multiple T2 hyperintense foci within the deep gray nuclei likely reflecting a combination of chronic lacunar infarcts and prominent perivascular spaces. Subcentimeter chronic  infarct within the right cerebellar hemisphere (series 5, image 6). No evidence of intracranial mass. No chronic intracranial blood products. No extra-axial fluid collection. No midline shift. Vascular: No definite loss of expected proximal arterial flow voids. 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 Sinuses/Orbits: Visualized orbits show no acute finding. Prior bilateral lens replacement. Trace bilateral ethmoid sinus mucosal thickening. Mild left maxillary sinus mucosal thickening. IMPRESSION: Acute/early subacute cortical and subcortical right PCA territory infarct affecting the right hippocampus, posteromedial right temporal lobe and right occipital lobe as described. Local mass effect with partial effacement of the right lateral ventricle occipital horn. No midline shift. No evidence of hemorrhagic conversion. 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 Background moderate cerebral white matter chronic small vessel ischemic disease. Subcentimeter chronic infarct within the right cerebellar hemisphere. Mild generalized parenchymal atrophy. Electronically Signed   By: Kellie Simmering DO   On: 06/29/2020 15:46   EP PPM/ICD IMPLANT  Result Date: 07/01/2020 SURGEON:  Allegra Lai, MD   PREPROCEDURE DIAGNOSIS:  Cryptogenic Stroke   POSTPROCEDURE DIAGNOSIS:  Cryptogenic Stroke    PROCEDURES:  1. Implantable loop recorder implantation   INTRODUCTION:  LATRINIA BASHOR is a 75 y.o. female with a history of unexplained stroke who presents today for implantable loop implantation.  The patient has had a cryptogenic stroke.  Despite an extensive workup by neurology, no reversible causes have been identified.  she has worn telemetry during which she did not have arrhythmias.  There is significant concern for possible atrial fibrillation as the cause  for the patients stroke.  The patient therefore presents today for  implantable loop implantation.   DESCRIPTION OF PROCEDURE:  Informed written consent was obtained, and the patient was brought to the electrophysiology lab in a fasting state.  The patient required no sedation for the procedure today.  Mapping over the patient's chest was performed by the EP lab staff to identify the area where electrograms were most prominent for ILR recording.  This area was found to be the left parasternal region over the 3rd-4th intercostal space. The patients left chest was therefore prepped and draped in the usual sterile fashion by the EP lab staff. The skin overlying the left parasternal region was infiltrated with lidocaine for local analgesia.  A 0.5-cm incision was made over the left parasternal region over the 3rd intercostal space.  A subcutaneous ILR pocket was fashioned using a combination of sharp and blunt dissection.  A Medtronic Reveal Linq model LINQ SN [id number] implantable loop recorder was then placed into the pocket  R waves were very prominent and measured 0.46mV. EBL<1 ml.  Steri- Strips and a sterile dressing were then applied.  There were no early apparent complications.   CONCLUSIONS:  1. Successful implantation of a Medtronic Reveal LINQ implantable loop recorder for cryptogenic stroke  2. No early apparent complications.   ECHOCARDIOGRAM COMPLETE BUBBLE STUDY  Result Date: 06/30/2020    ECHOCARDIOGRAM REPORT   Patient Name:   NAMINE ECCLESTON [facility] Date of Exam: 06/30/2020 Medical Rec #:  [id number]        Height:       65.0 in Accession #:    [id number]       Weight:       219.6 lb Date of Birth:  10/25/1945         BSA:          2.058 m Patient Age:    36 years         BP:           145/76 mmHg Patient Gender: F                HR:           68 bpm. Exam Location:  Inpatient Procedure: 2D Echo, Cardiac Doppler and Color Doppler Indications:    Stroke  History:        Patient has no prior history of  Echocardiogram examinations.                 Stroke; Risk Factors:Former Smoker and Hypertension.  Sonographer:    Cammy Brochure Referring Phys: [id number] [place]  1. Left ventricular ejection fraction, by estimation, is 60 to 65%. The left ventricle has normal function. The left ventricle has no regional wall motion abnormalities. There is mild left ventricular hypertrophy. Left ventricular diastolic parameters are consistent with Grade I diastolic dysfunction (impaired relaxation).  2. Right ventricular systolic function is normal. The right ventricular size is normal.  3. The mitral valve is normal in structure. Trivial mitral valve regurgitation. No evidence of mitral stenosis.  4. The aortic valve is tricuspid. Aortic valve regurgitation is not visualized. No aortic stenosis is present.  5. Aortic dilatation noted. There is moderate dilatation of the ascending aorta, measuring 46 mm.  6. The inferior vena cava is normal in size with greater than 50% respiratory variability, suggesting right atrial pressure of 3 mmHg.  7. Agitated saline contrast bubble study was negative, with no evidence of any interatrial shunt. Conclusion(s)/Recommendation(s): No intracardiac source of embolism detected on this transthoracic study.  A transesophageal echocardiogram is recommended to exclude cardiac source of embolism if clinically indicated. FINDINGS  Left Ventricle: Left ventricular ejection fraction, by estimation, is 60 to 65%. The left ventricle has normal function. The left ventricle has no regional wall motion abnormalities. The left ventricular internal cavity size was normal in size. There is  mild left ventricular hypertrophy. Left ventricular diastolic parameters are consistent with Grade I diastolic dysfunction (impaired relaxation). Right Ventricle: The right ventricular size is normal. No increase in right ventricular wall thickness. Right ventricular systolic function is normal. Left Atrium:  Left atrial size was normal in size. Right Atrium: Right atrial size was normal in size. Pericardium: There is no evidence of pericardial effusion. Mitral Valve: The mitral valve is normal in structure. Trivial mitral valve regurgitation. No evidence of mitral valve stenosis. Tricuspid Valve: The tricuspid valve is normal in structure. Tricuspid valve regurgitation is not demonstrated. No evidence of tricuspid stenosis. Aortic Valve: The aortic valve is tricuspid. Aortic valve regurgitation is not visualized. No aortic stenosis is present. Aortic valve mean gradient measures 6.0 mmHg. Aortic valve peak gradient measures 11.2 mmHg. Aortic valve area, by VTI measures 1.89  cm. Pulmonic Valve: The pulmonic valve was normal in structure. Pulmonic valve regurgitation is not visualized. No evidence of pulmonic stenosis. Aorta: Aortic dilatation noted. There is moderate dilatation of the ascending aorta, measuring 46 mm. Venous: The inferior vena cava is normal in size with greater than 50% respiratory variability, suggesting right atrial pressure of 3 mmHg. IAS/Shunts: No atrial level shunt detected by color flow Doppler. Agitated saline contrast was given intravenously to evaluate for intracardiac shunting. Agitated saline contrast bubble study was negative, with no evidence of any interatrial shunt.  LEFT VENTRICLE PLAX 2D LVIDd:         4.20 cm     Diastology LVIDs:         2.90 cm     LV e' medial:    6.53 cm/s LV PW:         1.30 cm     LV E/e' medial:  12.4 LV IVS:        1.30 cm     LV e' lateral:   7.40 cm/s LVOT diam:     1.90 cm     LV E/e' lateral: 10.9 LV SV:         67 LV SV Index:   33 LVOT Area:     2.84 cm  LV Volumes (MOD) LV vol d, MOD A2C: 83.4 ml LV vol d, MOD A4C: 83.1 ml LV vol s, MOD A2C: 32.2 ml LV vol s, MOD A4C: 32.7 ml LV SV MOD A2C:     51.2 ml LV SV MOD A4C:     83.1 ml LV SV MOD BP:      51.1 ml RIGHT VENTRICLE             IVC RV S prime:     17.00 cm/s  IVC diam: 1.40 cm TAPSE (M-mode):  2.2 cm LEFT ATRIUM             Index       RIGHT ATRIUM           Index LA diam:        2.80 cm 1.36 cm/m  RA Area:     10.50 cm LA Vol (A2C):   52.8 ml 25.65 ml/m RA Volume:   16.70 ml  8.11 ml/m LA Vol (A4C):   56.1 ml 27.26  ml/m LA Biplane Vol: 55.2 ml 26.82 ml/m  AORTIC VALVE AV Area (Vmax):    2.00 cm AV Area (Vmean):   1.95 cm AV Area (VTI):     1.89 cm AV Vmax:           167.00 cm/s AV Vmean:          118.000 cm/s AV VTI:            0.356 m AV Peak Grad:      11.2 mmHg AV Mean Grad:      6.0 mmHg LVOT Vmax:         118.00 cm/s LVOT Vmean:        81.300 cm/s LVOT VTI:          0.237 m LVOT/AV VTI ratio: 0.67  AORTA Ao Root diam: 3.20 cm MITRAL VALVE MV Area (PHT): 4.06 cm    SHUNTS MV Decel Time: 187 msec    Systemic VTI:  0.24 m MV E velocity: 81.00 cm/s  Systemic Diam: 1.90 cm MV A velocity: 98.50 cm/s MV E/A ratio:  0.82 Candee Furbish MD Electronically signed by Candee Furbish MD Signature Date/Time: 06/30/2020/12:23:41 PM    Final    VAS [place] LOWER EXTREMITY VENOUS (DVT)  Result Date: 06/30/2020  Lower Venous DVT Study Patient Name:  JULINE SANDERFORD [facility]  Date of Exam:   06/30/2020 Medical Rec #: [id number]         Accession #:    [id number] Date of Birth: May 29, 1945          Patient Gender: F Patient Age:   68Y Exam Location:  [facility] Procedure:      VAS [place] LOWER EXTREMITY VENOUS (DVT) Referring Phys: [id number] Rosalin Hawking --------------------------------------------------------------------------------  Indications: Stroke.  Comparison Study: No previous exams Performing Technologist: Rogelia Rohrer  Examination Guidelines: A complete evaluation includes B-mode imaging, spectral Doppler, color Doppler, and power Doppler as needed of all accessible portions of each vessel. Bilateral testing is considered an integral part of a complete examination. Limited examinations for reoccurring indications may be performed as noted. The reflux portion of the exam is performed with the patient in reverse  Trendelenburg.  +---------+---------------+---------+-----------+----------+--------------+ RIGHT    CompressibilityPhasicitySpontaneityPropertiesThrombus Aging +---------+---------------+---------+-----------+----------+--------------+ CFV      Full           Yes      Yes                                 +---------+---------------+---------+-----------+----------+--------------+ SFJ      Full                                                        +---------+---------------+---------+-----------+----------+--------------+ FV Prox  Full           Yes      Yes                                 +---------+---------------+---------+-----------+----------+--------------+ FV Mid   Full           Yes      Yes                                 +---------+---------------+---------+-----------+----------+--------------+  FV DistalFull           Yes      Yes                                 +---------+---------------+---------+-----------+----------+--------------+ PFV      Full                                                        +---------+---------------+---------+-----------+----------+--------------+ POP      Full           Yes      Yes                                 +---------+---------------+---------+-----------+----------+--------------+ PTV      Full                                                        +---------+---------------+---------+-----------+----------+--------------+ PERO     Full                                                        +---------+---------------+---------+-----------+----------+--------------+   +---------+---------------+---------+-----------+----------+--------------+ LEFT     CompressibilityPhasicitySpontaneityPropertiesThrombus Aging +---------+---------------+---------+-----------+----------+--------------+ CFV      Full           Yes      Yes                                  +---------+---------------+---------+-----------+----------+--------------+ SFJ      Full                                                        +---------+---------------+---------+-----------+----------+--------------+ FV Prox  Full           Yes      Yes                                 +---------+---------------+---------+-----------+----------+--------------+ FV Mid   Full           Yes      Yes                                 +---------+---------------+---------+-----------+----------+--------------+ FV DistalFull           Yes      Yes                                 +---------+---------------+---------+-----------+----------+--------------+ PFV      Full                                                        +---------+---------------+---------+-----------+----------+--------------+  POP      Full           Yes      Yes                                 +---------+---------------+---------+-----------+----------+--------------+ PTV      Full                                                        +---------+---------------+---------+-----------+----------+--------------+ PERO     Full                                                        +---------+---------------+---------+-----------+----------+--------------+     Summary: BILATERAL: - No evidence of deep vein thrombosis seen in the lower extremities, bilaterally. - No evidence of superficial venous thrombosis in the lower extremities, bilaterally. -No evidence of popliteal cyst, bilaterally.   *See table(s) above for measurements and observations. Electronically signed by Curt Jews MD on 06/30/2020 at 8:14:42 PM.    Final     PHYSICAL EXAM  Temp:  [97.6 F (36.4 C)-98 F (36.7 C)] 97.6 F (36.4 C) (05/04 1500) Pulse Rate:  [62-70] 69 (05/04 1500) Resp:  [15-18] 16 (05/04 1500) BP: (154-181)/(70-90) 160/90 (05/04 1500) SpO2:  [93 %-99 %] 95 % (05/04 1500)  General - Well nourished, well  developed, in no apparent distress.  Ophthalmologic - fundi not visualized due to noncooperation.  Cardiovascular - Regular rhythm and rate.  Mental Status -  Level of arousal and orientation to time, place, and person were intact. Language including expression, naming, repetition, comprehension was assessed and found intact. Fund of Knowledge was assessed and was intact.  Cranial Nerves II - XII - II - Visual field left hemianopia III, IV, VI - Extraocular movements intact. V - Facial sensation intact bilaterally. VII - Facial movement intact bilaterally. VIII - Hearing & vestibular intact bilaterally. X - Palate elevates symmetrically. XI - Chin turning & shoulder shrug intact bilaterally. XII - Tongue protrusion intact.  Motor Strength - The patient's strength was normal in all extremities and pronator drift was absent.  Bulk was normal and fasciculations were absent.   Motor Tone - Muscle tone was assessed at the neck and appendages and was normal.  Reflexes - The patient's reflexes were symmetrical in all extremities and she had no pathological reflexes.  Sensory - Light touch, temperature/pinprick were assessed and were symmetrical.    Coordination - The patient had normal movements in the hands and feet with no ataxia or dysmetria.  Tremor was absent.  Gait and Station - deferred.   ASSESSMENT/PLAN Ms. Rhonda Winters is a 75 y.o. female with history of DM, RLS, COVID 11/2019 admitted for left HH. No tPA given due to outside window.    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 fib    LDL 203  HgbA1c 5.8  SCDs for VTE prophylaxis  No antithrombotic prior to admission, now on aspirin 325 mg daily and plavix 75  DAPT for 3 months and then ASA alone given intracranial vascular occlusion.   Patient counseled to be compliant with her antithrombotic medications  Ongoing aggressive stroke risk factor management  Therapy recommendations:  Pending   Disposition:  Pending   Heart palpitation  2-3 brief episodes of heart palpitation / tachycardia since 11/2019  Lasted less than 68min  Concerning for PAF in the setting of current embolic stroke  Loop recorder placed for cardio monitoring to rule out A. fib  Diabetes  HgbA1c 5.8 goal < 7.0  Controlled  Currently on lantus  CBG monitoring  SSI  DM education and close PCP follow up  Hypertension . Stable . On HCTZ  Long term BP goal normotensive  Hyperlipidemia  Home meds:  none   LDL 203, goal < 70  Now on lipitor 80  Continue statin at discharge  Other Stroke Risk Factors  Advanced age  Other Active Problems  RLS  COVID 11/2019  Hospital day # 1  Neurology will sign off. Please call with questions. Pt will follow up with stroke clinic NP at [facility] in about 4 weeks. Thanks for the consult.   Rosalin Hawking, MD PhD Stroke Neurology 07/01/2020 8:04 PM    To contact Stroke Continuity provider, please refer to [email or web address] After hours, contact General Neurology

## 2020-07-01 NOTE — Progress Notes (Signed)
PT Cancellation Note  Patient Details Name: Rhonda Winters MRN: [id number] DOB: Dec 27, 1945   Cancelled Treatment:    Reason Eval/Treat Not Completed: Patient at procedure or test/unavailable Patient off unit. PT will re-attempt as time allows.   Kashia Brossard A. Gilford Rile PT, DPT Acute Rehabilitation Services Pager [phone] Office [phone]    Linna Hoff 07/01/2020, 2:25 PM

## 2020-07-01 NOTE — Progress Notes (Signed)
Physical Therapy Treatment Patient Details Name: Rhonda Winters MRN: [id number] DOB: [birth year] Today's Date: 07/01/2020    History of Present Illness Pt is a 75 y/o female with PMH of DM, restless leg syndrome, chronic LBP, presenting to ER for visual field defciits on L side. 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    PT Comments    Patient limited by pain in B feet. Patient ambulated 25' with RW and min guard for safety as patient with difficulty attending to L side. Patient educated on compensatory strategies for visual deficit with scanning environment. Continue to recommend OPPT following discharge to address strength and balance deficits.     Follow Up Recommendations  Outpatient PT     Equipment Recommendations  None recommended by PT    Recommendations for Other Services       Precautions / Restrictions Precautions Precautions: Fall Precaution Comments: L visual field cut    Mobility  Bed Mobility               General bed mobility comments: seated EOB on arrival    Transfers Overall transfer level: Needs assistance Equipment used: Rolling Nicolet Griffy (2 wheeled) Transfers: Sit to/from Stand Sit to Stand: Min assist         General transfer comment: minA for power up and steady, cues for hand placement  Ambulation/Gait Ambulation/Gait assistance: Min guard Gait Distance (Feet): 25 Feet Assistive device: Rolling Vannesa Abair (2 wheeled) Gait Pattern/deviations: Step-through pattern;Staggering right Gait velocity: decreased   General Gait Details: difficulty processing commands and attending to objects on L side   Stairs             Wheelchair Mobility    Modified Rankin (Stroke Patients Only) Modified Rankin (Stroke Patients Only) Pre-Morbid Rankin Score: No symptoms Modified Rankin: Moderately severe disability     Balance Overall balance assessment:  Needs assistance Sitting-balance support: No upper extremity supported;Feet supported Sitting balance-Leahy Scale: Good     Standing balance support: Bilateral upper extremity supported;During functional activity Standing balance-Leahy Scale: Poor Standing balance comment: reliant on UE support                            Cognition Arousal/Alertness: Awake/alert Behavior During Therapy: WFL for tasks assessed/performed Overall Cognitive Status: Impaired/Different from baseline Area of Impairment: Awareness;Problem solving                           Awareness: Emergent Problem Solving: Requires verbal cues General Comments: reviewed safety strategies for visual deficits with pt.  She demonstrates emergent awareness, but cues provided with anticipatory awareness.  She frequently self distracted with conversation and required frequent redirection to topic at hand      Exercises      General Comments        Pertinent Vitals/Pain Pain Assessment: No/denies pain    Home Living                      Prior Function            PT Goals (current goals can now be found in the care plan section) Acute Rehab PT Goals Patient Stated Goal: to go home PT Goal Formulation: With patient Time For Goal Achievement: 07/14/20 Potential to Achieve Goals: Good Progress towards PT goals: Progressing toward goals  Frequency    Min 4X/week      PT Plan Current plan remains appropriate    Co-evaluation              AM-PAC PT "6 Clicks" Mobility   Outcome Measure  Help needed turning from your back to your side while in a flat bed without using bedrails?: None Help needed moving from lying on your back to sitting on the side of a flat bed without using bedrails?: None Help needed moving to and from a bed to a chair (including a wheelchair)?: None Help needed standing up from a chair using your arms (e.g., wheelchair or bedside chair)?: A  Little Help needed to walk in hospital room?: A Little Help needed climbing 3-5 steps with a railing? : A Little 6 Click Score: 21    End of Session Equipment Utilized During Treatment: Gait belt Activity Tolerance: Patient tolerated treatment well Patient left: in bed;with call bell/phone within reach;with bed alarm set Nurse Communication: Mobility status PT Visit Diagnosis: Unsteadiness on feet (R26.81)     Time: 9381-8299 PT Time Calculation (min) (ACUTE ONLY): 23 min  Charges:  $Therapeutic Activity: 23-37 mins                     Layani Foronda A. Gilford Rile PT, DPT Acute Rehabilitation Services Pager [phone] Office [phone]    Linna Hoff 07/01/2020, 5:29 PM

## 2020-07-01 NOTE — Consult Note (Addendum)
ELECTROPHYSIOLOGY CONSULT NOTE  Patient ID: Rhonda Winters MRN: [id number], DOB/AGE: 1945/11/03   Admit date: 06/29/2020 Date of Consult: 07/01/2020  Primary Physician: Patient, No Pcp Per (Inactive) Primary Cardiologist: None  Primary Electrophysiologist: New to Dr. Curt Bears Reason for Consultation: Cryptogenic stroke; recommendations regarding Implantable Loop Recorder Insurance: Medicare  History of Present Illness EP has been asked to evaluate [name] for placement of an implantable loop recorder to monitor for atrial fibrillation by Dr Erlinda Hong.  The patient was admitted on 06/29/2020 with left vision loss.    Imaging demonstrated right PCA infarct embolic secondary to unclear source, but concerning for PAF.    She has undergone workup for stroke including:  Resultant left HH CT head right PCA infarct MRI  Right large PCA infarct CTA head and neck right P2/P3 occlusion 2D Echo  EF 60-65% LE venous doppler negative LDL 203 HgbA1c 5.8 SCDs for VTE prophylaxis  The patient has been monitored on telemetry which has demonstrated sinus rhythm with no arrhythmias.  Inpatient stroke work-up Tomislav Micale not require a TEE per Neurology.   Echocardiogram as above. Lab work is reviewed.  Prior to admission, the patient denies chest pain, shortness of breath, dizziness or syncope. She did have tachy-palpitations in the setting of COVID infection in 11/2019. No EKGs were done. She is recovering from her stroke with plans to return home  at discharge.  History reviewed. No pertinent past medical history.   Surgical History: History reviewed. No pertinent surgical history.   Medications Prior to Admission  Medication Sig Dispense Refill Last Dose   acetaminophen (TYLENOL) 500 MG tablet Take 1,000 mg by mouth every 6 (six) hours as needed for moderate pain.   06/29/2020 at Unknown time   albuterol (VENTOLIN HFA) 108 (90 Base) MCG/ACT inhaler Inhale 1 puff into the lungs every 4 (four) hours as  needed for shortness of breath. 1 each 0 unknown at Unknown time   amitriptyline (ELAVIL) 25 MG tablet Take 25 mg by mouth at bedtime.   06/28/2020 at Unknown time   ascorbic acid (VITAMIN C) 500 MG tablet Take 1 tablet (500 mg total) by mouth daily. 30 tablet 0 06/29/2020 at Unknown time   atenolol (TENORMIN) 50 MG tablet Take 50 mg by mouth daily.   06/29/2020 at 0800   diclofenac (VOLTAREN) 75 MG EC tablet Take 75 mg by mouth 2 (two) times daily.   06/29/2020 at Unknown time   DULoxetine (CYMBALTA) 60 MG capsule Take 60 mg by mouth daily.   06/29/2020 at Unknown time   EUTHYROX 125 MCG tablet Take 125 mcg by mouth daily.   06/29/2020 at Unknown time   furosemide (LASIX) 20 MG tablet Take 20 mg by mouth daily as needed for fluid.    Past Week at Unknown time   gabapentin (NEURONTIN) 800 MG tablet Take 800 mg by mouth 2 (two) times daily.    06/29/2020 at Unknown time   hydrochlorothiazide (HYDRODIURIL) 50 MG tablet Take 50 mg by mouth daily.   06/29/2020 at Unknown time   metFORMIN (GLUCOPHAGE-XR) 500 MG 24 hr tablet Take 1,000 mg by mouth every evening.   06/28/2020 at Unknown time    Inpatient Medications:   amitriptyline  25 mg Oral QHS   ascorbic acid  500 mg Oral Daily   aspirin  325 mg Oral Daily   atenolol  50 mg Oral Daily   atorvastatin  80 mg Oral Daily   clopidogrel  75 mg Oral Daily  gabapentin  800 mg Oral BID   hydrochlorothiazide  50 mg Oral Daily   insulin aspart  0-9 Units Subcutaneous TID WC   insulin glargine  10 Units Subcutaneous QHS   levothyroxine  125 mcg Oral Daily    Allergies: No Known Allergies  Social History   Socioeconomic History   Marital status: Married    Spouse name: Not on file   Number of children: Not on file   Years of education: Not on file   Highest education level: Not on file  Occupational History   Not on file  Tobacco Use   Smoking status: Not on file   Smokeless tobacco: Not on file  Substance and Sexual Activity   Alcohol use: Not on file    Drug use: Not on file   Sexual activity: Not on file  Other Topics Concern   Not on file  Social History Narrative   Not on file   Social Determinants of Health   Financial Resource Strain: Not on file  Food Insecurity: Not on file  Transportation Needs: Not on file  Physical Activity: Not on file  Stress: Not on file  Social Connections: Not on file  Intimate Partner Violence: Not on file     No family history on file.    Review of Systems: All other systems reviewed and are otherwise negative except as noted above.  Physical Exam: Vitals:   06/30/20 2056 06/30/20 2315 07/01/20 0351 07/01/20 0700  BP: (!) 161/73 (!) 154/74 (!) 160/70 (!) 181/84  Pulse: 66 67 70 66  Resp: 17 17 15 18   Temp: 97.8 F (36.6 C) 97.6 F (36.4 C) 97.9 F (36.6 C) 98 F (36.7 C)  TempSrc: Oral Oral Oral Oral  SpO2: 95% 96% 94% 99%    GEN- The patient is well appearing, alert and oriented x 3 today.   Head- normocephalic, atraumatic Eyes-  Sclera clear, conjunctiva pink Ears- hearing intact Oropharynx- clear Neck- supple Lungs- Clear to ausculation bilaterally, normal work of breathing Heart- Regular rate and rhythm, no murmurs, rubs or gallops  GI- soft, NT, ND, + BS Extremities- no clubbing, cyanosis, or edema MS- no significant deformity or atrophy Skin- no rash or lesion Psych- euthymic mood, full affect   Labs:   Lab Results  Component Value Date   WBC 5.7 06/30/2020   HGB 10.9 (L) 06/30/2020   HCT 33.8 (L) 06/30/2020   MCV 99.1 06/30/2020   PLT 198 06/30/2020    Recent Labs  Lab 06/30/20 0401  NA 140  K 3.2*  CL 101  CO2 30  BUN 14  CREATININE 0.73  CALCIUM 9.9  PROT 5.8*  BILITOT 0.3  ALKPHOS 70  ALT 15  AST 15  GLUCOSE 103*     Radiology/Studies: CT ANGIO HEAD W OR WO CONTRAST  Result Date: 06/29/2020 CLINICAL DATA:  PCA territory infarct EXAM: CT ANGIOGRAPHY HEAD AND NECK TECHNIQUE: Multidetector CT imaging of the head and neck was performed using  the standard protocol during bolus administration of intravenous contrast. Multiplanar CT image reconstructions and MIPs were obtained to evaluate the vascular anatomy. Carotid stenosis measurements (when applicable) are obtained utilizing NASCET criteria, using the distal internal carotid diameter as the denominator. CONTRAST:  81mL OMNIPAQUE IOHEXOL 350 MG/ML SOLN COMPARISON:  Head CT 06/29/2020 FINDINGS: CTA NECK FINDINGS SKELETON: There is no bony spinal canal stenosis. No lytic or blastic lesion. OTHER NECK: Normal pharynx, larynx and major salivary glands. No cervical lymphadenopathy. Unremarkable thyroid gland. UPPER  CHEST: No pneumothorax or pleural effusion. No nodules or masses. AORTIC ARCH: There is calcific atherosclerosis of the aortic arch. There is no aneurysm, dissection or hemodynamically significant stenosis of the visualized portion of the aorta. Conventional 3 vessel aortic branching pattern. The visualized proximal subclavian arteries are widely patent. RIGHT CAROTID SYSTEM: Normal without aneurysm, dissection or stenosis. LEFT CAROTID SYSTEM: Normal without aneurysm, dissection or stenosis. VERTEBRAL ARTERIES: Left dominant configuration. Both origins are clearly patent. There is no dissection, occlusion or flow-limiting stenosis to the skull base (V1-V3 segments). CTA HEAD FINDINGS POSTERIOR CIRCULATION: --Vertebral arteries: Normal V4 segments. --Inferior cerebellar arteries: Normal. --Basilar artery: Normal. --Superior cerebellar arteries: Normal. 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 Left PCA is normal. ANTERIOR CIRCULATION: --Intracranial internal carotid arteries: Normal. --Anterior cerebral arteries (ACA): Normal. Both A1 segments are present. Patent anterior communicating artery (a-comm). --Middle cerebral arteries (MCA): Normal. VENOUS SINUSES: As permitted by contrast timing, patent. ANATOMIC VARIANTS: None Review of the MIP images confirms  the above findings. IMPRESSION: 1. 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 2. No other intracranial arterial occlusion or high-grade stenosis. Aortic Atherosclerosis (ICD10-I70.0). Electronically Signed   By: Ulyses Jarred M.D.   On: 06/29/2020 22:03   CT Head Wo Contrast  Result Date: 06/29/2020 CLINICAL DATA:  Monocular vision loss. EXAM: CT HEAD WITHOUT CONTRAST TECHNIQUE: Contiguous axial images were obtained from the base of the skull through the vertex without intravenous contrast. COMPARISON:  None. FINDINGS: Brain: Brainstem and cerebellum are normal. Confluent low density is present in the right occipital lobe consistent with right PCA territory infarction. Mild swelling but no hemorrhage. No visible infarction of the right thalamus. Cerebral hemispheres show moderate chronic small-vessel changes of the white matter. No mass, hydrocephalus or extra-axial collection. Vascular: There is atherosclerotic calcification of the major vessels at the base of the brain. Skull: Negative Sinuses/Orbits: Clear/normal Other: None IMPRESSION: Acute infarction in the right occipital lobe with low-density and mild swelling. No hemorrhage. Findings consistent with partial right PCA territory infarction. Chronic small-vessel ischemic changes elsewhere affecting the cerebral hemispheric white matter. Electronically Signed   By: Nelson Chimes M.D.   On: 06/29/2020 14:59   CT ANGIO NECK W OR WO CONTRAST  Result Date: 06/29/2020 CLINICAL DATA:  PCA territory infarct EXAM: CT ANGIOGRAPHY HEAD AND NECK TECHNIQUE: Multidetector CT imaging of the head and neck was performed using the standard protocol during bolus administration of intravenous contrast. Multiplanar CT image reconstructions and MIPs were obtained to evaluate the vascular anatomy. Carotid stenosis measurements (when applicable) are obtained utilizing NASCET criteria, using the distal internal carotid diameter as the denominator. CONTRAST:  35mL OMNIPAQUE  IOHEXOL 350 MG/ML SOLN COMPARISON:  Head CT 06/29/2020 FINDINGS: CTA NECK FINDINGS SKELETON: There is no bony spinal canal stenosis. No lytic or blastic lesion. OTHER NECK: Normal pharynx, larynx and major salivary glands. No cervical lymphadenopathy. Unremarkable thyroid gland. UPPER CHEST: No pneumothorax or pleural effusion. No nodules or masses. AORTIC ARCH: There is calcific atherosclerosis of the aortic arch. There is no aneurysm, dissection or hemodynamically significant stenosis of the visualized portion of the aorta. Conventional 3 vessel aortic branching pattern. The visualized proximal subclavian arteries are widely patent. RIGHT CAROTID SYSTEM: Normal without aneurysm, dissection or stenosis. LEFT CAROTID SYSTEM: Normal without aneurysm, dissection or stenosis. VERTEBRAL ARTERIES: Left dominant configuration. Both origins are clearly patent. There is no dissection, occlusion or flow-limiting stenosis to the skull base (V1-V3 segments). CTA HEAD FINDINGS POSTERIOR CIRCULATION: --Vertebral arteries: Normal V4  segments. --Inferior cerebellar arteries: Normal. --Basilar artery: Normal. --Superior cerebellar arteries: Normal. 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 Left PCA is normal. ANTERIOR CIRCULATION: --Intracranial internal carotid arteries: Normal. --Anterior cerebral arteries (ACA): Normal. Both A1 segments are present. Patent anterior communicating artery (a-comm). --Middle cerebral arteries (MCA): Normal. VENOUS SINUSES: As permitted by contrast timing, patent. ANATOMIC VARIANTS: None Review of the MIP images confirms the above findings. IMPRESSION: 1. 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 2. No other intracranial arterial occlusion or high-grade stenosis. Aortic Atherosclerosis (ICD10-I70.0). Electronically Signed   By: Ulyses Jarred M.D.   On: 06/29/2020 22:03   MR BRAIN WO CONTRAST  Result Date: 06/29/2020 CLINICAL DATA:  Neuro deficit, acute,  stroke suspected. Additional history provided: Patient reports vision loss in left eye. EXAM: MRI HEAD WITHOUT CONTRAST TECHNIQUE: Multiplanar, multiecho pulse sequences of the brain and surrounding structures were obtained without intravenous contrast. COMPARISON:  Noncontrast head CT performed earlier today 06/29/2020. FINDINGS: Brain: Mild intermittent motion degradation. Mild cerebral and cerebellar atrophy. Region of cortical/subcortical restricted diffusion and T2/FLAIR hyperintense signal abnormality within the posteromedial right temporal lobe and right occipital lobe, measuring 6.0 x 3.2 x 3.4 cm (AP x TV x CC). Findings are compatible with acute/early subacute right PCA territory infarction. Acute/early subacute infarction changes are also present more anteriorly within the right hippocampus (also right PCA territory). Local mass effect with partial effacement of the right lateral ventricle occipital horn. No midline shift. No evidence of hemorrhagic conversion. Background moderate multifocal T2/FLAIR hyperintensity within the cerebral white matter, nonspecific but compatible with chronic small vessel ischemic disease. Multiple T2 hyperintense foci within the deep gray nuclei likely reflecting a combination of chronic lacunar infarcts and prominent perivascular spaces. Subcentimeter chronic infarct within the right cerebellar hemisphere (series 5, image 6). No evidence of intracranial mass. No chronic intracranial blood products. No extra-axial fluid collection. No midline shift. Vascular: No definite loss of expected proximal arterial flow voids. 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 Sinuses/Orbits: Visualized orbits show no acute finding. Prior bilateral lens replacement. Trace bilateral ethmoid sinus mucosal thickening. Mild left maxillary sinus  mucosal thickening. IMPRESSION: Acute/early subacute cortical and subcortical right PCA territory infarct affecting the right hippocampus, posteromedial right temporal lobe and right occipital lobe as described. Local mass effect with partial effacement of the right lateral ventricle occipital horn. No midline shift. No evidence of hemorrhagic conversion. 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 Background moderate cerebral white matter chronic small vessel ischemic disease. Subcentimeter chronic infarct within the right cerebellar hemisphere. Mild generalized parenchymal atrophy. Electronically Signed   By: Kellie Simmering DO   On: 06/29/2020 15:46   ECHOCARDIOGRAM COMPLETE BUBBLE STUDY  Result Date: 06/30/2020    ECHOCARDIOGRAM REPORT   Patient Name:   KENIDEE CREGAN [facility] Date of Exam: 06/30/2020 Medical Rec #:  [id number]        Height:       65.0 in Accession #:    [id number]       Weight:       219.6 lb Date of Birth:  1945-11-08         BSA:          2.058 m Patient Age:    67 years         BP:  145/76 mmHg Patient Gender: F                HR:           68 bpm. Exam Location:  Inpatient Procedure: 2D Echo, Cardiac Doppler and Color Doppler Indications:    Stroke  History:        Patient has no prior history of Echocardiogram examinations.                 Stroke; Risk Factors:Former Smoker and Hypertension.  Sonographer:    Cammy Brochure Referring Phys: [id number] [place]  1. Left ventricular ejection fraction, by estimation, is 60 to 65%. The left ventricle has normal function. The left ventricle has no regional wall motion abnormalities. There is mild left ventricular hypertrophy. Left ventricular diastolic parameters are consistent with Grade I diastolic dysfunction (impaired relaxation).  2. Right ventricular systolic function is normal. The right ventricular size is normal.  3. The mitral valve is normal in structure. Trivial mitral valve regurgitation. No  evidence of mitral stenosis.  4. The aortic valve is tricuspid. Aortic valve regurgitation is not visualized. No aortic stenosis is present.  5. Aortic dilatation noted. There is moderate dilatation of the ascending aorta, measuring 46 mm.  6. The inferior vena cava is normal in size with greater than 50% respiratory variability, suggesting right atrial pressure of 3 mmHg.  7. Agitated saline contrast bubble study was negative, with no evidence of any interatrial shunt. Conclusion(s)/Recommendation(s): No intracardiac source of embolism detected on this transthoracic study. A transesophageal echocardiogram is recommended to exclude cardiac source of embolism if clinically indicated. FINDINGS  Left Ventricle: Left ventricular ejection fraction, by estimation, is 60 to 65%. The left ventricle has normal function. The left ventricle has no regional wall motion abnormalities. The left ventricular internal cavity size was normal in size. There is  mild left ventricular hypertrophy. Left ventricular diastolic parameters are consistent with Grade I diastolic dysfunction (impaired relaxation). Right Ventricle: The right ventricular size is normal. No increase in right ventricular wall thickness. Right ventricular systolic function is normal. Left Atrium: Left atrial size was normal in size. Right Atrium: Right atrial size was normal in size. Pericardium: There is no evidence of pericardial effusion. Mitral Valve: The mitral valve is normal in structure. Trivial mitral valve regurgitation. No evidence of mitral valve stenosis. Tricuspid Valve: The tricuspid valve is normal in structure. Tricuspid valve regurgitation is not demonstrated. No evidence of tricuspid stenosis. Aortic Valve: The aortic valve is tricuspid. Aortic valve regurgitation is not visualized. No aortic stenosis is present. Aortic valve mean gradient measures 6.0 mmHg. Aortic valve peak gradient measures 11.2 mmHg. Aortic valve area, by VTI measures 1.89   cm. Pulmonic Valve: The pulmonic valve was normal in structure. Pulmonic valve regurgitation is not visualized. No evidence of pulmonic stenosis. Aorta: Aortic dilatation noted. There is moderate dilatation of the ascending aorta, measuring 46 mm. Venous: The inferior vena cava is normal in size with greater than 50% respiratory variability, suggesting right atrial pressure of 3 mmHg. IAS/Shunts: No atrial level shunt detected by color flow Doppler. Agitated saline contrast was given intravenously to evaluate for intracardiac shunting. Agitated saline contrast bubble study was negative, with no evidence of any interatrial shunt.  LEFT VENTRICLE PLAX 2D LVIDd:         4.20 cm     Diastology LVIDs:         2.90 cm     LV e' medial:  6.53 cm/s LV PW:         1.30 cm     LV E/e' medial:  12.4 LV IVS:        1.30 cm     LV e' lateral:   7.40 cm/s LVOT diam:     1.90 cm     LV E/e' lateral: 10.9 LV SV:         67 LV SV Index:   33 LVOT Area:     2.84 cm  LV Volumes (MOD) LV vol d, MOD A2C: 83.4 ml LV vol d, MOD A4C: 83.1 ml LV vol s, MOD A2C: 32.2 ml LV vol s, MOD A4C: 32.7 ml LV SV MOD A2C:     51.2 ml LV SV MOD A4C:     83.1 ml LV SV MOD BP:      51.1 ml RIGHT VENTRICLE             IVC RV S prime:     17.00 cm/s  IVC diam: 1.40 cm TAPSE (M-mode): 2.2 cm LEFT ATRIUM             Index       RIGHT ATRIUM           Index LA diam:        2.80 cm 1.36 cm/m  RA Area:     10.50 cm LA Vol (A2C):   52.8 ml 25.65 ml/m RA Volume:   16.70 ml  8.11 ml/m LA Vol (A4C):   56.1 ml 27.26 ml/m LA Biplane Vol: 55.2 ml 26.82 ml/m  AORTIC VALVE AV Area (Vmax):    2.00 cm AV Area (Vmean):   1.95 cm AV Area (VTI):     1.89 cm AV Vmax:           167.00 cm/s AV Vmean:          118.000 cm/s AV VTI:            0.356 m AV Peak Grad:      11.2 mmHg AV Mean Grad:      6.0 mmHg LVOT Vmax:         118.00 cm/s LVOT Vmean:        81.300 cm/s LVOT VTI:          0.237 m LVOT/AV VTI ratio: 0.67  AORTA Ao Root diam: 3.20 cm MITRAL VALVE MV Area  (PHT): 4.06 cm    SHUNTS MV Decel Time: 187 msec    Systemic VTI:  0.24 m MV E velocity: 81.00 cm/s  Systemic Diam: 1.90 cm MV A velocity: 98.50 cm/s MV E/A ratio:  0.82 Candee Furbish MD Electronically signed by Candee Furbish MD Signature Date/Time: 06/30/2020/12:23:41 PM    Final    VAS [place] LOWER EXTREMITY VENOUS (DVT)  Result Date: 06/30/2020  Lower Venous DVT Study Patient Name:  MARILYNNE PEREZHERNANDEZ [facility]  Date of Exam:   06/30/2020 Medical Rec #: [id number]         Accession #:    [id number] Date of Birth: 1945/10/20          Patient Gender: F Patient Age:   54Y Exam Location:  [facility] Procedure:      VAS [place] LOWER EXTREMITY VENOUS (DVT) Referring Phys: [id number] Rosalin Hawking --------------------------------------------------------------------------------  Indications: Stroke.  Comparison Study: No previous exams Performing Technologist: Rogelia Rohrer  Examination Guidelines: A complete evaluation includes B-mode imaging, spectral Doppler, color Doppler, and power Doppler as needed of all accessible portions of  each vessel. Bilateral testing is considered an integral part of a complete examination. Limited examinations for reoccurring indications may be performed as noted. The reflux portion of the exam is performed with the patient in reverse Trendelenburg.  +---------+---------------+---------+-----------+----------+--------------+ RIGHT    CompressibilityPhasicitySpontaneityPropertiesThrombus Aging +---------+---------------+---------+-----------+----------+--------------+ CFV      Full           Yes      Yes                                 +---------+---------------+---------+-----------+----------+--------------+ SFJ      Full                                                        +---------+---------------+---------+-----------+----------+--------------+ FV Prox  Full           Yes      Yes                                  +---------+---------------+---------+-----------+----------+--------------+ FV Mid   Full           Yes      Yes                                 +---------+---------------+---------+-----------+----------+--------------+ FV DistalFull           Yes      Yes                                 +---------+---------------+---------+-----------+----------+--------------+ PFV      Full                                                        +---------+---------------+---------+-----------+----------+--------------+ POP      Full           Yes      Yes                                 +---------+---------------+---------+-----------+----------+--------------+ PTV      Full                                                        +---------+---------------+---------+-----------+----------+--------------+ PERO     Full                                                        +---------+---------------+---------+-----------+----------+--------------+   +---------+---------------+---------+-----------+----------+--------------+ LEFT     CompressibilityPhasicitySpontaneityPropertiesThrombus Aging +---------+---------------+---------+-----------+----------+--------------+ CFV      Full  Yes      Yes                                 +---------+---------------+---------+-----------+----------+--------------+ SFJ      Full                                                        +---------+---------------+---------+-----------+----------+--------------+ FV Prox  Full           Yes      Yes                                 +---------+---------------+---------+-----------+----------+--------------+ FV Mid   Full           Yes      Yes                                 +---------+---------------+---------+-----------+----------+--------------+ FV DistalFull           Yes      Yes                                  +---------+---------------+---------+-----------+----------+--------------+ PFV      Full                                                        +---------+---------------+---------+-----------+----------+--------------+ POP      Full           Yes      Yes                                 +---------+---------------+---------+-----------+----------+--------------+ PTV      Full                                                        +---------+---------------+---------+-----------+----------+--------------+ PERO     Full                                                        +---------+---------------+---------+-----------+----------+--------------+     Summary: BILATERAL: - No evidence of deep vein thrombosis seen in the lower extremities, bilaterally. - No evidence of superficial venous thrombosis in the lower extremities, bilaterally. -No evidence of popliteal cyst, bilaterally.   *See table(s) above for measurements and observations. Electronically signed by Curt Jews MD on 06/30/2020 at 8:14:42 PM.    Final     12-lead ECG on arrival showed NSR at 62 bpm (personally reviewed) All prior EKG's in EPIC reviewed with no documented atrial fibrillation  Telemetry NSR  60-70s (personally reviewed)  Assessment and Plan:  1. Cryptogenic stroke The patient presents with cryptogenic stroke.  The patient does not have a TEE planned for this AM.  I spoke at length with the patient about monitoring for afib with an implantable loop recorder.  Risks, benefits, and alteratives to implantable loop recorder were discussed with the patient today.   At this time, the patient is very clear in their decision to proceed with implantable loop recorder.   2. Tachy-palpitations She had palpitations and HRs > 160 in the setting of COVID per her pulse ox at home. No EKGs of those rhythms.   Wound care was reviewed with the patient (keep incision clean and dry for 3 days).  Wound check scheduled and  entered in AVS. Please call with questions.    Shirley Friar, PA-C 07/01/2020 9:47 AM  I have seen and examined this patient with Oda Kilts.  Agree with above, note added to reflect my findings.  On exam, RRR, nom urmurs.  Patient presented to the hospital with cryptogenic stroke. To date, no cause has been found. Parris Signer plan for LINQ monitor to look for atrial fibrillation. Risks and benefits discussed. Risks include but not limited to bleeding and infection. The patient understands the risks and has agreed to the procedure.  Zadin Lange M. Shimshon Narula MD 07/01/2020 11:42 AM.

## 2020-07-01 NOTE — Progress Notes (Signed)
D/c instructions given to patient and verbalizes understanding. Patient with no complaints at the current time. Will d/c via [place] with spouse/ private vehicle.

## 2020-07-01 NOTE — Discharge Instructions (Signed)

## 2020-07-01 NOTE — Discharge Summary (Addendum)
Name: Rhonda Winters MRN: [id number] DOB: 18-Oct-1945 75 y.o. PCP: Patient, No Pcp Per (Inactive)  Date of Admission: 06/29/2020 12:32 PM Date of Discharge:  07/01/20 Attending Physician: Lucious Groves, DO  Discharge Diagnosis: 1. Right PCA stroke 2. Hypertension 3. Hyperlipidemia 4. Type 2 diabetes mellitus  Discharge Medications: Allergies as of 07/01/2020   No Known Allergies      Medication List     TAKE these medications    acetaminophen 500 MG tablet Commonly known as: TYLENOL Take 1,000 mg by mouth every 6 (six) hours as needed for moderate pain.   albuterol 108 (90 Base) MCG/ACT inhaler Commonly known as: VENTOLIN HFA Inhale 1 puff into the lungs every 4 (four) hours as needed for shortness of breath.   amitriptyline 25 MG tablet Commonly known as: ELAVIL Take 25 mg by mouth at bedtime.   ascorbic acid 500 MG tablet Commonly known as: VITAMIN C Take 1 tablet (500 mg total) by mouth daily.   aspirin 325 MG tablet Take 1 tablet (325 mg total) by mouth daily.   atenolol 50 MG tablet Commonly known as: TENORMIN Take 50 mg by mouth daily.   atorvastatin 80 MG tablet Commonly known as: LIPITOR Take 1 tablet (80 mg total) by mouth daily.   clopidogrel 75 MG tablet Commonly known as: PLAVIX Take 1 tablet (75 mg total) by mouth daily.   diclofenac 75 MG EC tablet Commonly known as: VOLTAREN Take 75 mg by mouth 2 (two) times daily.   DULoxetine 60 MG capsule Commonly known as: CYMBALTA Take 60 mg by mouth daily.   Euthyrox 125 MCG tablet Generic drug: levothyroxine Take 125 mcg by mouth daily.   furosemide 20 MG tablet Commonly known as: LASIX Take 20 mg by mouth daily as needed for fluid.   gabapentin 800 MG tablet Commonly known as: NEURONTIN Take 800 mg by mouth 2 (two) times daily.   hydrochlorothiazide 50 MG tablet Commonly known as: HYDRODIURIL Take 50 mg by mouth daily.   losartan 50 MG tablet Commonly known as: Cozaar Take 1  tablet (50 mg total) by mouth daily.   metFORMIN 500 MG 24 hr tablet Commonly known as: GLUCOPHAGE-XR Take 1,000 mg by mouth every evening.        Disposition and follow-up:   [name] was discharged from [facility] in Stable condition.  At the hospital follow up visit please address:  1.  R PCA stroke: She continue to have left sided visual deficients. Will have outpatient PT and OT set up at discharge. Started on ASA and plavix for 3 months and then will need ASA alone. Will need to have neurology follow arranged as outpatient.  Hypertension: BP elevated to 160s during admission on home medications. Started on Losartan 50 mg daily at discharge. Please follow up BP and adjust medication as needed. Hyperlipidemia: Started on atorvastatin. Had muscle pain on Crestor in the past. Please monitor for tolerance.  2.  Labs / imaging needed at time of follow-up: BMP  3.  Pending labs/ test needing follow-up:   Follow-up Appointments:  Follow-up Information     [facility] Follow up.   Specialty: Rehabilitation Why: The outpatient rehab will contact you for the first appointment Contact information: [place] [facility] Follow up.   Why: on 5/17 at 0920 for post hospital loop  recorder Contact information: [place] [id number] [place] Hospital Course by problem list: [name] is a 75 yo F, with a PMH of DM, HTN, restless leg syndrome, chronic back pain,  who presented to the ED with 3 days of L eye vision loss, and admitted for a R PCA stroke.  Right PCA stroke Patient presented with decreased vision of the left eye about 3 days prior to admission. Went to see her ophthalmologist and was sent to the ED for  stroke evaluation.  Risk factors included HTN, DM, and HLD. CT and MRI with right PCA territory infarct affecting the right hippocampus, posteromedial right temporal lobe and right occipital lobe.  CTA with R PCA occlusion at the P2/P3 junction. Echo with EF of 60-65%. Without source of embolism. Vascular US negative for DVT bilaterally. Remained in sinus rhythm during admission. Had implantable loop recorder place prior to discharge to evaluate for arrhythmias. Started on aspiring 325 mg daily and Plavix 75 mg daily, which she will need for 3 months and then aspirin alone. Started on atorvastain 80 mg daily. Previously had muscle pain on Crestor and not on statin prior to admission. Did not experience new or worsening symptoms during admission. Continued to have left hemianopia throughout admission without other neurological deficits. Evaluated by PT and OT and will need outpatient PT and OT on discharge.   Hypertension Patient was normotensive on admission. Home medications include atenolol 50 mg daily and hydrochlorothiazide 50 mg daily. Given she presented 3 days after symptom onset resumed on home BP medications. However BP remained elevated in 150-160s during admission. Discussed the importance of BP control to prevent future strokes and started on losartan 50 mg daily. Will need follow up with PCP to monitor BP and renal function. Patient was mildly hypokalemic during admission and if BP well controlled on ARB may benefit from decrease her thiazide.   Hyperlipidemia Elevated lipid panel with LDL of 203. Not on statin at home due to muscle pain while on Crestor. Started on atorvastatin 80 mg daily and will need follow up as outpatient.   Diabetes  Home medication is metformin 1000 mg daily. A1c of 5.8 during admission.  Glucose well controlled during admission. Will resume home medications at discharge.  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  Will need need to be re-imaged at 6 months.   Subjective She will get update from PA later. Understands plan. Ready to go home today. Husband will pick her up. States her husband has been dealing with health problems recently that has caused her some distress; states he has Parkinson's. Back hurts today, nothing new, tylenol is helping the pain. L eye remains hard to see out of; did not see team standing to her left side. She is optimistic about getting used to it.   Discharge Exam:   BP (!) 160/90 (BP Location: Left Leg)   Pulse 69   Temp 97.6 F (36.4 C) (Oral)   Resp 16   SpO2 95%  Discharge exam:  Physical Exam Constitutional:      General: She is not in acute distress.    Appearance: Normal appearance.  Cardiovascular:     Rate and Rhythm: Normal rate and regular rhythm.  Pulmonary:     Effort: Pulmonary effort is normal.     Breath sounds: Normal breath sounds.  Skin:  General: Skin is warm and dry.  Neurological:     Mental Status: She is alert.     Comments: Left hemiopsia of the left eye Psychiatric:        Mood and Affect: Mood normal.        Behavior: Behavior normal.      Pertinent Labs, Studies, and Procedures:  CBC    Component Value Date/Time   WBC 10.3 07/01/2020 1511   RBC 4.00 07/01/2020 1511   HGB 12.6 07/01/2020 1511   HCT 39.6 07/01/2020 1511   PLT 216 07/01/2020 1511   MCV 99.0 07/01/2020 1511   MCH 31.5 07/01/2020 1511   MCHC 31.8 07/01/2020 1511   RDW 14.6 07/01/2020 1511   LYMPHSABS 1.6 06/29/2020 1300   MONOABS 0.5 06/29/2020 1300   EOSABS 0.2 06/29/2020 1300   BASOSABS 0.0 06/29/2020 1300   BMP Latest Ref Rng & Units 07/01/2020 06/30/2020 06/29/2020  Glucose 70 - 99 mg/dL 123(H) 103(H) 102(H)  BUN 8 - 23 mg/dL 18 14 23   Creatinine 0.44 - 1.00 mg/dL 0.75 0.73 1.00  Sodium 135 - 145 mmol/L 139 140 141  Potassium 3.5 - 5.1 mmol/L 3.8 3.2(L) 3.6  Chloride 98 - 111 mmol/L 101 101 100  CO2 22 - 32 mmol/L 28 30 -  Calcium 8.9 - 10.3 mg/dL  10.5(H) 9.9 -   Lipid Panel     Component Value Date/Time   CHOL 276 (H) 06/30/2020 0401   TRIG 135 06/30/2020 0401   HDL 46 06/30/2020 0401   CHOLHDL 6.0 06/30/2020 0401   VLDL 27 06/30/2020 0401   LDLCALC 203 (H) 06/30/2020 0401    CT ANGIO HEAD W OR WO CONTRAST 1. 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 2. No other intracranial arterial occlusion or high-grade stenosis. Aortic Atherosclerosis  CT Head Wo Contrast Acute infarction in the right occipital lobe with low-density and mild swelling. No hemorrhage. Findings consistent with partial right PCA territory infarction. Chronic small-vessel ischemic changes elsewhere affecting the cerebral hemispheric white matter.  CT ANGIO NECK W OR WO CONTRAST 1. 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 2. No other intracranial arterial occlusion or high-grade stenosis. Aortic Atherosclerosis   MR BRAIN WO CONTRAST Acute/early subacute cortical and subcortical right PCA territory infarct affecting the right hippocampus, posteromedial right temporal lobe and right occipital lobe as described. Local mass effect with partial effacement of the right lateral ventricle occipital horn. No midline shift. No evidence of hemorrhagic conversion. 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 Background moderate cerebral white matter chronic small vessel ischemic disease. Subcentimeter chronic infarct within the right cerebellar hemisphere. Mild generalized parenchymal atrophy.   ECHOCARDIOGRAM COMPLETE BUBBLE STUDY 1. Left ventricular ejection fraction, by estimation, is 60 to 65%. The left ventricle has normal function. The left ventricle has no regional wall motion abnormalities. There is mild left ventricular hypertrophy. Left ventricular diastolic parameters are consistent with Grade I diastolic dysfunction (impaired relaxation).   2. Right ventricular systolic function is normal. The right ventricular size is normal.   3. The  mitral valve is normal in structure. Trivial mitral valve regurgitation. No evidence of mitral stenosis.   4. The aortic valve is tricuspid. Aortic valve regurgitation is not visualized. No aortic stenosis is present.   5. Aortic dilatation noted. There is moderate dilatation of the ascending aorta, measuring 46 mm.   6. The inferior vena cava is normal in size with greater than 50% respiratory variability, suggesting right atrial pressure of 3  mmHg.   7. Agitated saline contrast bubble study was negative, with no evidence of any interatrial shunt. Conclusion(s)/Recommendation(s): No intracardiac source of embolism detected on this transthoracic study. A transesophageal echocardiogram is recommended to exclude cardiac source of embolism if clinically indicated.   VAS [place] LOWER EXTREMITY VENOUS (DVT) BILATERAL: - No evidence of deep vein thrombosis seen in the lower extremities, bilaterally. - No evidence of superficial venous thrombosis in the lower extremities, bilaterally. -No evidence of popliteal cyst, bilaterally.     Discharge Instructions: Discharge Instructions     Ambulatory referral to Occupational Therapy   Complete by: As directed    Ambulatory referral to Physical Therapy   Complete by: As directed    Call MD for:  difficulty breathing, headache or visual disturbances   Complete by: As directed    Call MD for:  extreme fatigue   Complete by: As directed    Call MD for:  persistant dizziness or light-headedness   Complete by: As directed    Call MD for:  persistant nausea and vomiting   Complete by: As directed    Diet - low sodium heart healthy   Complete by: As directed    Discharge instructions   Complete by: As directed    You were hospitalized for stroke. You will need to take asprin and plavis for 3 months and then asapin alone after that. You had a cardioac monitor implanted to check for heart rhythmns that might have caused your stroke. You were also started on a statin  medicaiton to help lower cholesterol and Losartan a blood pressure medication to prevent risk of stroke in the future. You will also be sheduled with outpatient physical therapy to help with mangaing changes in your vision. Please follow up with your primary care doctor and with neurology. Thank you for allowing [place] to be part of your care.   Increase activity slowly   Complete by: As directed        Signed: Iona Beard, MD 07/01/2020, 3:59 PM   Pager: [phone]

## 2020-07-01 NOTE — Progress Notes (Signed)
Occupational Therapy Treatment Patient Details Name: Rhonda Winters MRN: [id number] DOB: 10-Aug-1945 Today's Date: 07/01/2020    History of present illness Pt is a 75 y/o female with PMH of DM, restless leg syndrome, chronic LBP, presenting to ER for visual field defciits on L side. 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   OT comments  Reviewed scanning strategies, as well as safety with Lt visual field deficit in the community and in the home - written handout provided and reviewed with her.  Recommend 24 hour supervision, and OPOT.  She is aware she is not to drive.   Follow Up Recommendations  Outpatient OT;Supervision/Assistance - 24 hour    Equipment Recommendations  None recommended by OT    Recommendations for Other Services      Precautions / Restrictions Precautions Precautions: Fall Precaution Comments: L visual field cut       Mobility Bed Mobility                    Transfers                      Balance                                           ADL either performed or assessed with clinical judgement   ADL                                               Vision   Additional Comments: Reviewed visual compensation strategies with pt and provided handout.  Reviewed scanning strategies, need for direct supervision when outside, and to have someone walk on her Lt side when in the community.  Also reviewed how to use anchors for reading and to maintain clutter free home as well as remove area rugs to reduce fall risk.  She verbalized understanding   Perception     Praxis      Cognition Arousal/Alertness: Awake/alert Behavior During Therapy: WFL for tasks assessed/performed Overall Cognitive Status: Impaired/Different from baseline Area of Impairment: Awareness;Problem solving                            Awareness: Emergent Problem Solving: Requires verbal cues General Comments: reviewed safety strategies for visual deficits with pt.  She demonstrates emergent awareness, but cues provided with anticipatory awareness.  She frequently self distracted with conversation and required frequent redirection to topic at hand        Exercises     Shoulder Instructions       General Comments      Pertinent Vitals/ Pain       Pain Assessment: No/denies pain  Home Living                                          Prior Functioning/Environment              Frequency  Min 2X/week        Progress Toward Goals  OT Goals(current goals can now  be found in the care plan section)  Progress towards OT goals: Progressing toward goals     Plan Discharge plan remains appropriate    Co-evaluation                 AM-PAC OT "6 Clicks" Daily Activity     Outcome Measure   Help from another person eating meals?: None Help from another person taking care of personal grooming?: A Little Help from another person toileting, which includes using toliet, bedpan, or urinal?: A Little Help from another person bathing (including washing, rinsing, drying)?: A Little Help from another person to put on and taking off regular upper body clothing?: A Little Help from another person to put on and taking off regular lower body clothing?: A Little 6 Click Score: 19    End of Session    OT Visit Diagnosis: Other abnormalities of gait and mobility (R26.89);Other symptoms and signs involving the nervous system (R29.898)   Activity Tolerance Patient tolerated treatment well   Patient Left in bed;with bed alarm set;with call bell/phone within reach   Nurse Communication Mobility status        Time: 8099-8338 OT Time Calculation (min): 14 min  Charges: OT General Charges $OT Visit: 1 Visit OT Treatments $Self Care/Home Management : 8-22 mins  Nilsa Nutting OTR/L Acute  Rehabilitation Services Pager [phone] Office [place], [place] 07/01/2020, 5:13 PM

## 2020-07-02 ENCOUNTER — Encounter (HOSPITAL_COMMUNITY): Payer: Self-pay | Admitting: Cardiology

## 2020-07-14 ENCOUNTER — Ambulatory Visit (INDEPENDENT_AMBULATORY_CARE_PROVIDER_SITE_OTHER): Payer: Medicare Other | Admitting: Emergency Medicine

## 2020-07-14 ENCOUNTER — Other Ambulatory Visit: Payer: Self-pay

## 2020-07-14 DIAGNOSIS — I639 Cerebral infarction, unspecified: Secondary | ICD-10-CM

## 2020-07-14 LAB — CUP PACEART INCLINIC DEVICE CHECK
Date Time Interrogation Session: 20220517102326
Implantable Pulse Generator Implant Date: 20220504

## 2020-07-14 NOTE — Patient Instructions (Addendum)
1. You may return to all normal activities including showering.  2. You device is MRI and mammogram compatible.  3. Your 4 digit passcode is [id number]  4. Your password is [email or web address]  5. Please plug in your remote monitor (iphone) at bedside upon your return home today. You do not need to carry this monitor with you.  We will notify you if we receive any alerts that need addressing.  6. If you have any questions or concerns about the remote monitoring please give [place] a call at [phone]

## 2020-07-14 NOTE — Progress Notes (Signed)
ILR wound check in clinic. Steri strips removed. Wound well healed. Home monitor (iphone) set up in clinic and provided to patient/husband today with instructions to plug in at bedside upon return home. No episodes. Questions answered.

## 2020-08-03 ENCOUNTER — Other Ambulatory Visit: Payer: Self-pay | Admitting: Student

## 2020-08-08 ENCOUNTER — Other Ambulatory Visit: Payer: Self-pay | Admitting: Student

## 2020-08-10 DIAGNOSIS — I63531 Cerebral infarction due to unspecified occlusion or stenosis of right posterior cerebral artery: Secondary | ICD-10-CM | POA: Insufficient documentation

## 2020-08-20 ENCOUNTER — Ambulatory Visit (INDEPENDENT_AMBULATORY_CARE_PROVIDER_SITE_OTHER): Payer: Medicare Other

## 2020-08-20 DIAGNOSIS — I639 Cerebral infarction, unspecified: Secondary | ICD-10-CM

## 2020-08-20 LAB — CUP PACEART REMOTE DEVICE CHECK
Date Time Interrogation Session: 20220622134620
Implantable Pulse Generator Implant Date: 20220504

## 2020-08-23 ENCOUNTER — Other Ambulatory Visit: Payer: Self-pay | Admitting: Student

## 2020-09-08 NOTE — Progress Notes (Signed)
[facility] Summary Report / Loop Recorder 

## 2021-01-25 ENCOUNTER — Emergency Department (HOSPITAL_COMMUNITY): Payer: Medicare Other

## 2021-01-25 ENCOUNTER — Other Ambulatory Visit: Payer: Self-pay

## 2021-01-25 ENCOUNTER — Inpatient Hospital Stay (HOSPITAL_COMMUNITY)
Admission: EM | Admit: 2021-01-25 | Discharge: 2021-02-12 | DRG: 004 | Disposition: A | Discharge status: Skilled Nursing Facility | Payer: Medicare Other | Attending: Pulmonary Disease | Admitting: Pulmonary Disease

## 2021-01-25 ENCOUNTER — Encounter (HOSPITAL_COMMUNITY): Payer: Self-pay | Admitting: Emergency Medicine

## 2021-01-25 DIAGNOSIS — Z781 Physical restraint status: Secondary | ICD-10-CM

## 2021-01-25 DIAGNOSIS — I10 Essential (primary) hypertension: Secondary | ICD-10-CM | POA: Diagnosis present

## 2021-01-25 DIAGNOSIS — M549 Dorsalgia, unspecified: Secondary | ICD-10-CM | POA: Diagnosis present

## 2021-01-25 DIAGNOSIS — D6489 Other specified anemias: Secondary | ICD-10-CM | POA: Diagnosis present

## 2021-01-25 DIAGNOSIS — Z7984 Long term (current) use of oral hypoglycemic drugs: Secondary | ICD-10-CM

## 2021-01-25 DIAGNOSIS — A419 Sepsis, unspecified organism: Secondary | ICD-10-CM | POA: Diagnosis not present

## 2021-01-25 DIAGNOSIS — K219 Gastro-esophageal reflux disease without esophagitis: Secondary | ICD-10-CM | POA: Diagnosis present

## 2021-01-25 DIAGNOSIS — Z01818 Encounter for other preprocedural examination: Secondary | ICD-10-CM

## 2021-01-25 DIAGNOSIS — E1140 Type 2 diabetes mellitus with diabetic neuropathy, unspecified: Secondary | ICD-10-CM | POA: Diagnosis present

## 2021-01-25 DIAGNOSIS — R7989 Other specified abnormal findings of blood chemistry: Secondary | ICD-10-CM

## 2021-01-25 DIAGNOSIS — D320 Benign neoplasm of cerebral meninges: Secondary | ICD-10-CM | POA: Diagnosis present

## 2021-01-25 DIAGNOSIS — R238 Other skin changes: Secondary | ICD-10-CM | POA: Diagnosis not present

## 2021-01-25 DIAGNOSIS — Z7982 Long term (current) use of aspirin: Secondary | ICD-10-CM

## 2021-01-25 DIAGNOSIS — J154 Pneumonia due to other streptococci: Secondary | ICD-10-CM

## 2021-01-25 DIAGNOSIS — J189 Pneumonia, unspecified organism: Secondary | ICD-10-CM | POA: Diagnosis not present

## 2021-01-25 DIAGNOSIS — F32A Depression, unspecified: Secondary | ICD-10-CM | POA: Diagnosis present

## 2021-01-25 DIAGNOSIS — Z4659 Encounter for fitting and adjustment of other gastrointestinal appliance and device: Secondary | ICD-10-CM

## 2021-01-25 DIAGNOSIS — Z7902 Long term (current) use of antithrombotics/antiplatelets: Secondary | ICD-10-CM

## 2021-01-25 DIAGNOSIS — E785 Hyperlipidemia, unspecified: Secondary | ICD-10-CM | POA: Diagnosis present

## 2021-01-25 DIAGNOSIS — W19XXXA Unspecified fall, initial encounter: Secondary | ICD-10-CM

## 2021-01-25 DIAGNOSIS — R52 Pain, unspecified: Secondary | ICD-10-CM | POA: Diagnosis not present

## 2021-01-25 DIAGNOSIS — Z79899 Other long term (current) drug therapy: Secondary | ICD-10-CM

## 2021-01-25 DIAGNOSIS — Z20822 Contact with and (suspected) exposure to covid-19: Secondary | ICD-10-CM | POA: Diagnosis present

## 2021-01-25 DIAGNOSIS — R6520 Severe sepsis without septic shock: Secondary | ICD-10-CM

## 2021-01-25 DIAGNOSIS — J9601 Acute respiratory failure with hypoxia: Secondary | ICD-10-CM

## 2021-01-25 DIAGNOSIS — E1165 Type 2 diabetes mellitus with hyperglycemia: Secondary | ICD-10-CM | POA: Diagnosis not present

## 2021-01-25 DIAGNOSIS — J9621 Acute and chronic respiratory failure with hypoxia: Secondary | ICD-10-CM

## 2021-01-25 DIAGNOSIS — Z87891 Personal history of nicotine dependence: Secondary | ICD-10-CM

## 2021-01-25 DIAGNOSIS — R6521 Severe sepsis with septic shock: Secondary | ICD-10-CM | POA: Diagnosis present

## 2021-01-25 DIAGNOSIS — N179 Acute kidney failure, unspecified: Secondary | ICD-10-CM | POA: Diagnosis not present

## 2021-01-25 DIAGNOSIS — Z9289 Personal history of other medical treatment: Secondary | ICD-10-CM

## 2021-01-25 DIAGNOSIS — R0602 Shortness of breath: Secondary | ICD-10-CM

## 2021-01-25 DIAGNOSIS — Z978 Presence of other specified devices: Secondary | ICD-10-CM

## 2021-01-25 DIAGNOSIS — G8929 Other chronic pain: Secondary | ICD-10-CM | POA: Diagnosis present

## 2021-01-25 DIAGNOSIS — Z885 Allergy status to narcotic agent status: Secondary | ICD-10-CM

## 2021-01-25 DIAGNOSIS — G9349 Other encephalopathy: Secondary | ICD-10-CM | POA: Diagnosis present

## 2021-01-25 DIAGNOSIS — E870 Hyperosmolality and hypernatremia: Secondary | ICD-10-CM | POA: Diagnosis not present

## 2021-01-25 DIAGNOSIS — E039 Hypothyroidism, unspecified: Secondary | ICD-10-CM | POA: Diagnosis present

## 2021-01-25 DIAGNOSIS — A403 Sepsis due to Streptococcus pneumoniae: Secondary | ICD-10-CM | POA: Diagnosis not present

## 2021-01-25 DIAGNOSIS — Z6841 Body Mass Index (BMI) 40.0 and over, adult: Secondary | ICD-10-CM

## 2021-01-25 DIAGNOSIS — J9690 Respiratory failure, unspecified, unspecified whether with hypoxia or hypercapnia: Secondary | ICD-10-CM

## 2021-01-25 DIAGNOSIS — J690 Pneumonitis due to inhalation of food and vomit: Secondary | ICD-10-CM | POA: Diagnosis not present

## 2021-01-25 DIAGNOSIS — Z8616 Personal history of COVID-19: Secondary | ICD-10-CM

## 2021-01-25 DIAGNOSIS — G9340 Encephalopathy, unspecified: Secondary | ICD-10-CM | POA: Diagnosis present

## 2021-01-25 DIAGNOSIS — I639 Cerebral infarction, unspecified: Secondary | ICD-10-CM | POA: Diagnosis present

## 2021-01-25 DIAGNOSIS — Z930 Tracheostomy status: Secondary | ICD-10-CM

## 2021-01-25 DIAGNOSIS — R4182 Altered mental status, unspecified: Secondary | ICD-10-CM

## 2021-01-25 DIAGNOSIS — Z8673 Personal history of transient ischemic attack (TIA), and cerebral infarction without residual deficits: Secondary | ICD-10-CM

## 2021-01-25 DIAGNOSIS — R569 Unspecified convulsions: Secondary | ICD-10-CM | POA: Diagnosis not present

## 2021-01-25 DIAGNOSIS — J90 Pleural effusion, not elsewhere classified: Secondary | ICD-10-CM | POA: Diagnosis not present

## 2021-01-25 DIAGNOSIS — J13 Pneumonia due to Streptococcus pneumoniae: Secondary | ICD-10-CM | POA: Diagnosis present

## 2021-01-25 DIAGNOSIS — R32 Unspecified urinary incontinence: Secondary | ICD-10-CM | POA: Diagnosis not present

## 2021-01-25 DIAGNOSIS — I471 Supraventricular tachycardia: Secondary | ICD-10-CM | POA: Diagnosis not present

## 2021-01-25 DIAGNOSIS — J384 Edema of larynx: Secondary | ICD-10-CM | POA: Diagnosis not present

## 2021-01-25 DIAGNOSIS — E876 Hypokalemia: Secondary | ICD-10-CM | POA: Diagnosis not present

## 2021-01-25 DIAGNOSIS — G9341 Metabolic encephalopathy: Secondary | ICD-10-CM | POA: Diagnosis present

## 2021-01-25 HISTORY — DX: Type 2 diabetes mellitus without complications: E11.9

## 2021-01-25 HISTORY — DX: Hypothyroidism, unspecified: E03.9

## 2021-01-25 HISTORY — DX: Essential (primary) hypertension: I10

## 2021-01-25 HISTORY — DX: Cerebral infarction, unspecified: I63.9

## 2021-01-25 LAB — CBC WITH DIFFERENTIAL/PLATELET
Abs Immature Granulocytes: 0.09 10*3/uL — ABNORMAL HIGH (ref 0.00–0.07)
Basophils Absolute: 0.1 10*3/uL (ref 0.0–0.1)
Basophils Relative: 1 %
Eosinophils Absolute: 0 10*3/uL (ref 0.0–0.5)
Eosinophils Relative: 0 %
HCT: 35.1 % — ABNORMAL LOW (ref 36.0–46.0)
Hemoglobin: 11.4 g/dL — ABNORMAL LOW (ref 12.0–15.0)
Immature Granulocytes: 1 %
Lymphocytes Relative: 5 %
Lymphs Abs: 0.7 10*3/uL (ref 0.7–4.0)
MCH: 32.7 pg (ref 26.0–34.0)
MCHC: 32.5 g/dL (ref 30.0–36.0)
MCV: 100.6 fL — ABNORMAL HIGH (ref 80.0–100.0)
Monocytes Absolute: 0.2 10*3/uL (ref 0.1–1.0)
Monocytes Relative: 2 %
Neutro Abs: 11.9 10*3/uL — ABNORMAL HIGH (ref 1.7–7.7)
Neutrophils Relative %: 91 %
Platelets: 212 10*3/uL (ref 150–400)
RBC: 3.49 MIL/uL — ABNORMAL LOW (ref 3.87–5.11)
RDW: 13.9 % (ref 11.5–15.5)
WBC: 12.9 10*3/uL — ABNORMAL HIGH (ref 4.0–10.5)
nRBC: 0.2 % (ref 0.0–0.2)

## 2021-01-25 LAB — BLOOD GAS, ARTERIAL
Acid-base deficit: 3.5 mmol/L — ABNORMAL HIGH (ref 0.0–2.0)
Bicarbonate: 23 mmol/L (ref 20.0–28.0)
O2 Saturation: 90.4 %
Patient temperature: 97.3
pCO2 arterial: 49.3 mmHg — ABNORMAL HIGH (ref 32.0–48.0)
pH, Arterial: 7.286 — ABNORMAL LOW (ref 7.350–7.450)
pO2, Arterial: 67.4 mmHg — ABNORMAL LOW (ref 83.0–108.0)

## 2021-01-25 LAB — COMPREHENSIVE METABOLIC PANEL
ALT: 20 U/L (ref 0–44)
AST: 17 U/L (ref 15–41)
Albumin: 3.4 g/dL — ABNORMAL LOW (ref 3.5–5.0)
Alkaline Phosphatase: 70 U/L (ref 38–126)
Anion gap: 15 (ref 5–15)
BUN: 83 mg/dL — ABNORMAL HIGH (ref 8–23)
CO2: 24 mmol/L (ref 22–32)
Calcium: 9.6 mg/dL (ref 8.9–10.3)
Chloride: 96 mmol/L — ABNORMAL LOW (ref 98–111)
Creatinine, Ser: 2.64 mg/dL — ABNORMAL HIGH (ref 0.44–1.00)
GFR, Estimated: 18 mL/min — ABNORMAL LOW (ref >60)
Glucose, Bld: 115 mg/dL — ABNORMAL HIGH (ref 70–99)
Potassium: 3.1 mmol/L — ABNORMAL LOW (ref 3.5–5.1)
Sodium: 135 mmol/L (ref 135–145)
Total Bilirubin: 1 mg/dL (ref 0.3–1.2)
Total Protein: 7.6 g/dL (ref 6.5–8.1)

## 2021-01-25 LAB — LACTIC ACID, PLASMA
Lactic Acid, Venous: 2.5 mmol/L (ref 0.5–1.9)
Lactic Acid, Venous: 1.3 mmol/L (ref 0.5–1.9)

## 2021-01-25 LAB — PROTIME-INR
INR: 1.1 (ref 0.8–1.2)
Prothrombin Time: 14.7 seconds (ref 11.4–15.2)

## 2021-01-25 LAB — RESP PANEL BY RT-PCR (FLU A&B, COVID) ARPGX2
Influenza A by PCR: NEGATIVE
Influenza B by PCR: NEGATIVE
SARS Coronavirus 2 by RT PCR: NEGATIVE

## 2021-01-25 LAB — CBG MONITORING, ED: Glucose-Capillary: 97 mg/dL (ref 70–99)

## 2021-01-25 MED ORDER — SODIUM CHLORIDE 0.9 % IV SOLN
2.0000 g | INTRAVENOUS | Status: DC
  Administered 2021-01-25 – 2021-01-27 (×2): 2 g via INTRAVENOUS
  Filled 2021-01-25: qty 20

## 2021-01-25 MED ORDER — SODIUM CHLORIDE 0.9 % IV SOLN
500.0000 mg | INTRAVENOUS | Status: DC
  Administered 2021-01-25 – 2021-01-27 (×2): 500 mg via INTRAVENOUS
  Filled 2021-01-25: qty 500

## 2021-01-25 MED ORDER — LACTATED RINGERS IV BOLUS (SEPSIS)
1000.0000 mL | Freq: Once | INTRAVENOUS | Status: AC
  Administered 2021-01-25: 1000 mL via INTRAVENOUS

## 2021-01-25 MED ORDER — SODIUM CHLORIDE 0.9 % IV SOLN
INTRAVENOUS | Status: AC
  Filled 2021-01-25: qty 20

## 2021-01-25 MED ORDER — SODIUM CHLORIDE 0.9 % IV SOLN
INTRAVENOUS | Status: AC
  Filled 2021-01-25: qty 500

## 2021-01-25 MED ORDER — LACTATED RINGERS IV SOLN: INTRAVENOUS | Status: AC

## 2021-01-25 MED ORDER — LACTATED RINGERS IV BOLUS
1000.0000 mL | Freq: Once | INTRAVENOUS | Status: AC
  Administered 2021-01-25: 1000 mL via INTRAVENOUS

## 2021-01-25 NOTE — Sepsis Progress Note (Signed)
[facility] following for Sepsis Protocol 

## 2021-01-25 NOTE — ED Notes (Signed)
Patient placed on 2L [place], difficulty getting a proper pleth

## 2021-01-25 NOTE — ED Notes (Signed)
IV team at bedside 

## 2021-01-25 NOTE — ED Provider Notes (Signed)
[place] DEPT Provider Note   CSN: [id number] Arrival date & time: 01/25/21  2006     History Chief Complaint  Patient presents with   Altered Mental Status    Rhonda Winters is a 75 y.o. female.  The history is provided by the patient, the spouse and medical records.  Altered Mental Status Rhonda Winters is a 75 y.o. female who presents to the Emergency Department complaining of AMS.  Level V caveat due to AMS.  She presents to the ED accompanied by her husband for evaluation of AMS and fall.  On Friday she had a fall and hit her face.  She has associated cough for several days, subjective fever.  No vomiting but has nausea.  Sxs are severe and constant in nature.     Past Medical History:  Diagnosis Date   Acquired hypothyroidism    Hypertension    Stroke [facility])    Type 2 diabetes mellitus ([place])     Patient Active Problem List   Diagnosis Date Noted   CAP (community acquired pneumonia) 01/26/2021   Cerebrovascular accident (CVA) due to occlusion of right posterior cerebral artery ([place]) 08/10/2020   CVA (cerebral vascular accident) ([place]) 06/30/2020   Stroke ([place]) 06/29/2020   Trigeminal neuralgia of right side of face 06/03/2020   Acute hypoxemic respiratory failure due to COVID-19 ([place]) 12/26/2019   HTN (hypertension) 12/26/2019   Diabetic neuropathy associated with type 2 diabetes mellitus ([place]) 12/26/2019   Chronic back pain 12/26/2019   T2DM (type 2 diabetes mellitus) ([place]) 12/26/2019   Hypokalemia 12/26/2019   Elevated LFTs 12/26/2019   Hypothyroidism 12/26/2019   DDD (degenerative disc disease), lumbar 05/17/2017   Hyperlipidemia with target LDL less than 130 06/12/2013   Depression 03/15/2011   GERD (gastroesophageal reflux disease) 03/15/2011   Restless legs 03/15/2011    Past Surgical History:  Procedure Laterality Date   LOOP RECORDER INSERTION N/A 07/01/2020   Procedure: LOOP RECORDER INSERTION;  Surgeon: Constance Haw, MD;  Location: [place] [facility] LAB;  Service: Cardiovascular;  Laterality: N/A;     OB History   No obstetric history on file.     No family history on file.  Social History   Tobacco Use   Smoking status: Former    Types: Cigarettes    Quit date: 09/17/1988    Years since quitting: 32.3   Smokeless tobacco: Never  Substance Use Topics   Alcohol use: Yes   Drug use: Never    Home Medications Prior to Admission medications   Medication Sig Start Date End Date Taking? Authorizing Provider  acetaminophen (TYLENOL) 500 MG tablet Take 1,000 mg by mouth every 6 (six) hours as needed for moderate pain.   Yes [provider]  amitriptyline (ELAVIL) 25 MG tablet Take 25 mg by mouth at bedtime. 11/18/19  Yes [provider]  ascorbic acid (VITAMIN C) 500 MG tablet Take 1 tablet (500 mg total) by mouth daily. 12/31/19  Yes Pokhrel, Laxman, MD  aspirin 325 MG tablet Take 1 tablet (325 mg total) by mouth daily. 07/01/20  Yes Iona Beard, MD  atenolol (TENORMIN) 50 MG tablet Take 50 mg by mouth daily. 11/11/19  Yes [provider]  BIOTIN PO Take 1 tablet by mouth daily.   Yes [provider]  cetirizine (ZYRTEC) 10 MG tablet Take 10 mg by mouth daily as needed for allergies.   Yes [provider]  clopidogrel (PLAVIX) 75 MG tablet Take 1  tablet (75 mg total) by mouth daily. 07/01/20  Yes Iona Beard, MD  diclofenac (VOLTAREN) 75 MG EC tablet Take 75 mg by mouth 2 (two) times daily. 11/18/19  Yes [provider]  DULoxetine (CYMBALTA) 60 MG capsule Take 60 mg by mouth daily. 11/18/19  Yes [provider]  gabapentin (NEURONTIN) 800 MG tablet Take 800 mg by mouth 3 (three) times daily. 11/18/19  Yes [provider]  hydrochlorothiazide (HYDRODIURIL) 50 MG tablet Take 50 mg by mouth daily. 11/18/19  Yes [provider]  levothyroxine (SYNTHROID) 125 MCG tablet Take 125 mcg by mouth daily before breakfast.    Yes [provider]  losartan (COZAAR) 50 MG tablet Take 1 tablet (50 mg total) by mouth daily. 07/01/20 01/26/22 Yes Iona Beard, MD  metFORMIN (GLUCOPHAGE-XR) 500 MG 24 hr tablet Take 500-1,000 mg by mouth See admin instructions. 11/18/19  Yes [provider]  vitamin E 180 MG (400 UNITS) capsule Take 400 Units by mouth daily.   Yes [provider]  atorvastatin (LIPITOR) 80 MG tablet Take 1 tablet (80 mg total) by mouth daily. Patient not taking: Reported on 01/26/2021 07/01/20   Iona Beard, MD  furosemide (LASIX) 20 MG tablet Take 20 mg by mouth daily as needed for fluid.  Patient not taking: Reported on 01/26/2021 08/21/19   [provider]    Allergies    Hydrocodone-acetaminophen  Review of Systems   Review of Systems  All other systems reviewed and are negative.  Physical Exam Updated Vital Signs BP 92/67   Pulse 74   Temp (!) 97.4 F (36.3 C) (Oral)   Resp (!) 24   Ht 5\' 2"  (1.575 m)   Wt 92.5 kg   SpO2 94%   BMI 37.31 kg/m   Physical Exam Vitals and nursing note reviewed.  Constitutional:      General: She is in acute distress.     Appearance: She is well-developed. She is ill-appearing.  HENT:     Head: Normocephalic and atraumatic.     Mouth/Throat:     Mouth: Mucous membranes are dry.  Cardiovascular:     Rate and Rhythm: Normal rate and regular rhythm.     Heart sounds: No murmur heard. Pulmonary:     Effort: Pulmonary effort is normal. No respiratory distress.     Comments: Coarse breath sounds bilaterally Abdominal:     Palpations: Abdomen is soft.     Tenderness: There is no abdominal tenderness. There is no guarding or rebound.  Musculoskeletal:        General: No swelling or tenderness.  Skin:    General: Skin is warm and dry.  Neurological:     Mental Status: She is alert and oriented to person, place, and time.     Comments: Confused, oriented to place and time.  Disoriented to recent events.    Psychiatric:        Behavior: Behavior normal.    ED Results / Procedures / Treatments   Labs (all labs ordered are listed, but only abnormal results are displayed) Labs Reviewed  COMPREHENSIVE METABOLIC PANEL - Abnormal; Notable for the following components:      Result Value   Potassium 3.1 (*)    Chloride 96 (*)    Glucose, Bld 115 (*)    BUN 83 (*)    Creatinine, Ser 2.64 (*)    Albumin 3.4 (*)    GFR, Estimated 18 (*)    All other components within normal limits  LACTIC ACID, PLASMA - Abnormal; Notable for the following components:   Lactic Acid, Venous 2.5 (*)    All other components within normal limits  CBC WITH DIFFERENTIAL/PLATELET - Abnormal; Notable for the following components:   WBC 12.9 (*)    RBC 3.49 (*)    Hemoglobin 11.4 (*)    HCT 35.1 (*)    MCV 100.6 (*)    Neutro Abs 11.9 (*)    Abs Immature Granulocytes 0.09 (*)    All other components within normal limits  URINALYSIS, ROUTINE W REFLEX MICROSCOPIC - Abnormal; Notable for the following components:   Color, Urine AMBER (*)    APPearance CLOUDY (*)    Protein, ur 30 (*)    Bacteria, UA RARE (*)    All other components within normal limits  BLOOD GAS, ARTERIAL - Abnormal; Notable for the following components:   pH, Arterial 7.286 (*)    pCO2 arterial 49.3 (*)    pO2, Arterial 67.4 (*)    Acid-base deficit 3.5 (*)    All other components within normal limits  RESP PANEL BY RT-PCR (FLU A&B, COVID) ARPGX2  CULTURE, BLOOD (ROUTINE X 2)  CULTURE, BLOOD (ROUTINE X 2)  LACTIC ACID, PLASMA  PROTIME-INR  CBG MONITORING, ED    EKG EKG Interpretation  Date/Time:  Tuesday January 26 2021 00:57:14 EST Ventricular Rate:  74 PR Interval:  174 QRS Duration: 110 QT Interval:  403 QTC Calculation: 448 R Axis:   37 Text Interpretation: Sinus rhythm Probable anterior infarct, age indeterminate Confirmed by Quintella Reichert [phone]) on 01/26/2021 12:58:59 AM  Radiology DG Chest 2 View  Result Date:  01/25/2021 CLINICAL DATA:  Sepsis, UTI EXAM: CHEST - 2 VIEW COMPARISON:  Radiograph 12/26/2019 FINDINGS: Normal cardiac silhouette.  Low lung volumes. Dense peripheral consolidation in the RIGHT upper lobe and to a lesser degree the RIGHT lower lobe. LEFT lung clear. IMPRESSION: Dense RIGHT upper lobe consolidation is favored pneumonia. Cannot exclude a pulmonary mass. Followup PA and lateral chest X-ray is recommended in 3-4 weeks following trial of antibiotic therapy to ensure resolution and exclude underlying malignancy. Electronically Signed   By: Suzy Bouchard M.D.   On: 01/25/2021 21:25   CT Head Wo Contrast  Result Date: 01/26/2021 CLINICAL DATA:  Initial evaluation for acute head trauma, altered mental status. EXAM: CT HEAD WITHOUT CONTRAST TECHNIQUE: Contiguous axial images were obtained from the base of the skull through the vertex without intravenous contrast. COMPARISON:  Prior studies from 06/29/2020. FINDINGS: Brain: Cerebral volume within normal limits for age. Moderate chronic microvascular ischemic disease noted. Interval evolution of right PCA territory infarct, now largely chronic in appearance. No acute intracranial hemorrhage. No acute large vessel territory infarct. No mass lesion or midline shift. No hydrocephalus or extra-axial fluid collection. Vascular: No hyperdense vessel. Calcified atherosclerosis present at skull base. Skull: No scalp soft tissue abnormality.  Calvarium intact. Sinuses/Orbits: Globes normal soft tissues demonstrate no acute finding. Paranasal sinuses and mastoid air cells are clear. Other: None. IMPRESSION: 1. No acute intracranial abnormality. 2. Interval evolution of right PCA territory infarct, now chronic in appearance. 3. Underlying moderate chronic microvascular ischemic disease. Electronically Signed   By: Jeannine Boga M.D.   On: 01/26/2021 00:01    Procedures Procedures  CRITICAL CARE Performed by: Quintella Reichert   Total critical care  time: 40 minutes  Critical care time was exclusive of separately billable procedures and treating other patients.  Critical care was necessary to treat or prevent imminent or life-threatening  deterioration.  Critical care was time spent personally by me on the following activities: development of treatment plan with patient and/or surrogate as well as nursing, discussions with consultants, evaluation of patient's response to treatment, examination of patient, obtaining history from patient or surrogate, ordering and performing treatments and interventions, ordering and review of laboratory studies, ordering and review of radiographic studies, pulse oximetry and re-evaluation of patient's condition.  Medications Ordered in ED Medications  lactated ringers infusion ( Intravenous New Bag/Given 01/25/21 2346)  cefTRIAXone (ROCEPHIN) 2 g in sodium chloride 0.9 % 100 mL IVPB (0 g Intravenous Stopped 01/26/21 0015)  azithromycin (ZITHROMAX) 500 mg in sodium chloride 0.9 % 250 mL IVPB (0 mg Intravenous Stopped 01/26/21 0139)  lactated ringers bolus 1,000 mL (0 mLs Intravenous Stopped 01/26/21 0139)  lactated ringers bolus 1,000 mL (0 mLs Intravenous Stopped 01/26/21 0054)    ED Course  I have reviewed the triage vital signs and the nursing notes.  Pertinent labs & imaging results that were available during my care of the patient were reviewed by me and considered in my medical decision making (see chart for details).    MDM Rules/Calculators/A&P                         patient here for evaluation of cough, hallucinations and fall a few days ago. She is ill appearing on evaluation with altered mental status, course of breath sounds. Labs significant for acute renal failure. Her lactic acid is elevated. Chest x-ray consistent with acute pneumonia. She was started on broad-spectrum antibiotics, IV fluids. Code sepsis was initiated at time of patient evaluation. Discussed with patient and husband  findings of studies and recommendation for admission and they are in agreement with treatment plan. Hospitalist consulted for admission.  Final Clinical Impression(s) / ED Diagnoses Final diagnoses:  Community acquired pneumonia of right upper lobe of lung  Sepsis with acute renal failure without septic shock, due to unspecified organism, unspecified acute renal failure type [facility])    Rx / DC Orders ED Discharge Orders     None        Quintella Reichert, MD 01/26/21 [phone]

## 2021-01-25 NOTE — ED Notes (Signed)
Multiple attempts made for second IV on patient that have been unsuccessful. IV team consult placed for second line and cultures.

## 2021-01-25 NOTE — ED Triage Notes (Signed)
Patient arrives from home. Patient presents with intermittent AMS and AVH. Patient hx of CVA and UTI. Stroke screen negative per EMS. Patient denies pain. Alert and oriented with EMS. Patient had a fall 2 days ago, symptoms started 3 days ago.   119 CBG 98/54 68 99% 96.8 PERRL

## 2021-01-26 ENCOUNTER — Encounter (HOSPITAL_COMMUNITY): Payer: Self-pay | Admitting: Internal Medicine

## 2021-01-26 ENCOUNTER — Inpatient Hospital Stay (HOSPITAL_COMMUNITY): Payer: Medicare Other

## 2021-01-26 DIAGNOSIS — Z6841 Body Mass Index (BMI) 40.0 and over, adult: Secondary | ICD-10-CM | POA: Diagnosis not present

## 2021-01-26 DIAGNOSIS — R52 Pain, unspecified: Secondary | ICD-10-CM | POA: Diagnosis present

## 2021-01-26 DIAGNOSIS — G9340 Encephalopathy, unspecified: Secondary | ICD-10-CM | POA: Diagnosis not present

## 2021-01-26 DIAGNOSIS — R4182 Altered mental status, unspecified: Secondary | ICD-10-CM | POA: Diagnosis not present

## 2021-01-26 DIAGNOSIS — E870 Hyperosmolality and hypernatremia: Secondary | ICD-10-CM | POA: Diagnosis not present

## 2021-01-26 DIAGNOSIS — Z8616 Personal history of COVID-19: Secondary | ICD-10-CM | POA: Diagnosis not present

## 2021-01-26 DIAGNOSIS — R410 Disorientation, unspecified: Secondary | ICD-10-CM | POA: Diagnosis not present

## 2021-01-26 DIAGNOSIS — E1140 Type 2 diabetes mellitus with diabetic neuropathy, unspecified: Secondary | ICD-10-CM | POA: Diagnosis present

## 2021-01-26 DIAGNOSIS — I63531 Cerebral infarction due to unspecified occlusion or stenosis of right posterior cerebral artery: Secondary | ICD-10-CM | POA: Diagnosis not present

## 2021-01-26 DIAGNOSIS — I471 Supraventricular tachycardia: Secondary | ICD-10-CM | POA: Diagnosis not present

## 2021-01-26 DIAGNOSIS — R401 Stupor: Secondary | ICD-10-CM | POA: Diagnosis not present

## 2021-01-26 DIAGNOSIS — A419 Sepsis, unspecified organism: Secondary | ICD-10-CM | POA: Diagnosis present

## 2021-01-26 DIAGNOSIS — Z8673 Personal history of transient ischemic attack (TIA), and cerebral infarction without residual deficits: Secondary | ICD-10-CM | POA: Diagnosis not present

## 2021-01-26 DIAGNOSIS — D6489 Other specified anemias: Secondary | ICD-10-CM | POA: Diagnosis present

## 2021-01-26 DIAGNOSIS — A403 Sepsis due to Streptococcus pneumoniae: Secondary | ICD-10-CM | POA: Diagnosis present

## 2021-01-26 DIAGNOSIS — R569 Unspecified convulsions: Secondary | ICD-10-CM | POA: Diagnosis not present

## 2021-01-26 DIAGNOSIS — F32A Depression, unspecified: Secondary | ICD-10-CM | POA: Diagnosis present

## 2021-01-26 DIAGNOSIS — J9621 Acute and chronic respiratory failure with hypoxia: Secondary | ICD-10-CM | POA: Diagnosis not present

## 2021-01-26 DIAGNOSIS — E039 Hypothyroidism, unspecified: Secondary | ICD-10-CM | POA: Diagnosis present

## 2021-01-26 DIAGNOSIS — N179 Acute kidney failure, unspecified: Secondary | ICD-10-CM | POA: Diagnosis present

## 2021-01-26 DIAGNOSIS — R451 Restlessness and agitation: Secondary | ICD-10-CM | POA: Diagnosis not present

## 2021-01-26 DIAGNOSIS — I10 Essential (primary) hypertension: Secondary | ICD-10-CM | POA: Diagnosis present

## 2021-01-26 DIAGNOSIS — J13 Pneumonia due to Streptococcus pneumoniae: Secondary | ICD-10-CM | POA: Diagnosis present

## 2021-01-26 DIAGNOSIS — E1165 Type 2 diabetes mellitus with hyperglycemia: Secondary | ICD-10-CM | POA: Diagnosis not present

## 2021-01-26 DIAGNOSIS — G9349 Other encephalopathy: Secondary | ICD-10-CM | POA: Diagnosis present

## 2021-01-26 DIAGNOSIS — E1142 Type 2 diabetes mellitus with diabetic polyneuropathy: Secondary | ICD-10-CM

## 2021-01-26 DIAGNOSIS — J189 Pneumonia, unspecified organism: Secondary | ICD-10-CM

## 2021-01-26 DIAGNOSIS — Z20822 Contact with and (suspected) exposure to covid-19: Secondary | ICD-10-CM | POA: Diagnosis present

## 2021-01-26 DIAGNOSIS — R0603 Acute respiratory distress: Secondary | ICD-10-CM | POA: Diagnosis not present

## 2021-01-26 DIAGNOSIS — R6520 Severe sepsis without septic shock: Secondary | ICD-10-CM | POA: Diagnosis not present

## 2021-01-26 DIAGNOSIS — J154 Pneumonia due to other streptococci: Secondary | ICD-10-CM | POA: Diagnosis not present

## 2021-01-26 DIAGNOSIS — R6521 Severe sepsis with septic shock: Secondary | ICD-10-CM | POA: Diagnosis present

## 2021-01-26 DIAGNOSIS — J384 Edema of larynx: Secondary | ICD-10-CM | POA: Diagnosis not present

## 2021-01-26 DIAGNOSIS — J90 Pleural effusion, not elsewhere classified: Secondary | ICD-10-CM | POA: Diagnosis not present

## 2021-01-26 DIAGNOSIS — J9601 Acute respiratory failure with hypoxia: Secondary | ICD-10-CM | POA: Diagnosis not present

## 2021-01-26 DIAGNOSIS — G9341 Metabolic encephalopathy: Secondary | ICD-10-CM | POA: Diagnosis present

## 2021-01-26 DIAGNOSIS — W19XXXA Unspecified fall, initial encounter: Secondary | ICD-10-CM | POA: Diagnosis present

## 2021-01-26 DIAGNOSIS — J690 Pneumonitis due to inhalation of food and vomit: Secondary | ICD-10-CM | POA: Diagnosis not present

## 2021-01-26 LAB — CBC WITH DIFFERENTIAL/PLATELET
Abs Immature Granulocytes: 0.07 10*3/uL (ref 0.00–0.07)
Basophils Absolute: 0.1 10*3/uL (ref 0.0–0.1)
Basophils Relative: 0 %
Eosinophils Absolute: 0 10*3/uL (ref 0.0–0.5)
Eosinophils Relative: 0 %
HCT: 32.5 % — ABNORMAL LOW (ref 36.0–46.0)
Hemoglobin: 10.8 g/dL — ABNORMAL LOW (ref 12.0–15.0)
Immature Granulocytes: 1 %
Lymphocytes Relative: 5 %
Lymphs Abs: 0.6 10*3/uL — ABNORMAL LOW (ref 0.7–4.0)
MCH: 33.8 pg (ref 26.0–34.0)
MCHC: 33.2 g/dL (ref 30.0–36.0)
MCV: 101.6 fL — ABNORMAL HIGH (ref 80.0–100.0)
Monocytes Absolute: 0.2 10*3/uL (ref 0.1–1.0)
Monocytes Relative: 1 %
Neutro Abs: 10.3 10*3/uL — ABNORMAL HIGH (ref 1.7–7.7)
Neutrophils Relative %: 93 %
Platelets: 209 10*3/uL (ref 150–400)
RBC: 3.2 MIL/uL — ABNORMAL LOW (ref 3.87–5.11)
RDW: 14.1 % (ref 11.5–15.5)
WBC: 11.1 10*3/uL — ABNORMAL HIGH (ref 4.0–10.5)
nRBC: 0 % (ref 0.0–0.2)

## 2021-01-26 LAB — GLUCOSE, CAPILLARY
Glucose-Capillary: 102 mg/dL — ABNORMAL HIGH (ref 70–99)
Glucose-Capillary: 122 mg/dL — ABNORMAL HIGH (ref 70–99)
Glucose-Capillary: 127 mg/dL — ABNORMAL HIGH (ref 70–99)

## 2021-01-26 LAB — URINALYSIS, ROUTINE W REFLEX MICROSCOPIC
Bilirubin Urine: NEGATIVE
Glucose, UA: NEGATIVE mg/dL
Hgb urine dipstick: NEGATIVE
Ketones, ur: NEGATIVE mg/dL
Leukocytes,Ua: NEGATIVE
Nitrite: NEGATIVE
Protein, ur: 30 mg/dL — AB
Specific Gravity, Urine: 1.026 (ref 1.005–1.030)
pH: 5 (ref 5.0–8.0)

## 2021-01-26 LAB — BLOOD CULTURE ID PANEL (REFLEXED) - BCID2

## 2021-01-26 LAB — BASIC METABOLIC PANEL
Anion gap: 16 — ABNORMAL HIGH (ref 5–15)
BUN: 66 mg/dL — ABNORMAL HIGH (ref 8–23)
CO2: 21 mmol/L — ABNORMAL LOW (ref 22–32)
Calcium: 9.5 mg/dL (ref 8.9–10.3)
Chloride: 104 mmol/L (ref 98–111)
Creatinine, Ser: 1.56 mg/dL — ABNORMAL HIGH (ref 0.44–1.00)
GFR, Estimated: 34 mL/min — ABNORMAL LOW (ref >60)
Glucose, Bld: 106 mg/dL — ABNORMAL HIGH (ref 70–99)
Potassium: 2.9 mmol/L — ABNORMAL LOW (ref 3.5–5.1)
Sodium: 141 mmol/L (ref 135–145)

## 2021-01-26 LAB — COMPREHENSIVE METABOLIC PANEL
ALT: 22 U/L (ref 0–44)
AST: 28 U/L (ref 15–41)
Albumin: 3 g/dL — ABNORMAL LOW (ref 3.5–5.0)
Alkaline Phosphatase: 77 U/L (ref 38–126)
Anion gap: 13 (ref 5–15)
BUN: 79 mg/dL — ABNORMAL HIGH (ref 8–23)
CO2: 23 mmol/L (ref 22–32)
Calcium: 8.9 mg/dL (ref 8.9–10.3)
Chloride: 100 mmol/L (ref 98–111)
Creatinine, Ser: 2.2 mg/dL — ABNORMAL HIGH (ref 0.44–1.00)
GFR, Estimated: 23 mL/min — ABNORMAL LOW (ref >60)
Glucose, Bld: 116 mg/dL — ABNORMAL HIGH (ref 70–99)
Potassium: 3.4 mmol/L — ABNORMAL LOW (ref 3.5–5.1)
Sodium: 136 mmol/L (ref 135–145)
Total Bilirubin: 0.9 mg/dL (ref 0.3–1.2)
Total Protein: 6.7 g/dL (ref 6.5–8.1)

## 2021-01-26 LAB — STREP PNEUMONIAE URINARY ANTIGEN: Strep Pneumo Urinary Antigen: NEGATIVE

## 2021-01-26 LAB — CBG MONITORING, ED
Glucose-Capillary: 106 mg/dL — ABNORMAL HIGH (ref 70–99)
Glucose-Capillary: 105 mg/dL — ABNORMAL HIGH (ref 70–99)
Glucose-Capillary: 109 mg/dL — ABNORMAL HIGH (ref 70–99)
Glucose-Capillary: 92 mg/dL (ref 70–99)

## 2021-01-26 LAB — LACTIC ACID, PLASMA: Lactic Acid, Venous: 1.7 mmol/L (ref 0.5–1.9)

## 2021-01-26 LAB — PROCALCITONIN: Procalcitonin: 27.56 ng/mL

## 2021-01-26 MED ORDER — POTASSIUM CHLORIDE 10 MEQ/100ML IV SOLN
10.0000 meq | INTRAVENOUS | Status: AC
  Administered 2021-01-26 (×3): 10 meq via INTRAVENOUS

## 2021-01-26 MED ORDER — LEVOTHYROXINE SODIUM 100 MCG/5ML IV SOLN
62.5000 ug | Freq: Every day | INTRAVENOUS | Status: DC
  Administered 2021-01-26 – 2021-01-27 (×2): 62.5 ug via INTRAVENOUS
  Filled 2021-01-26 (×2): qty 5

## 2021-01-26 MED ORDER — HALOPERIDOL LACTATE 5 MG/ML IJ SOLN
INTRAMUSCULAR | Status: AC
  Filled 2021-01-26: qty 1

## 2021-01-26 MED ORDER — LABETALOL HCL 5 MG/ML IV SOLN
10.0000 mg | INTRAVENOUS | Status: DC | PRN
  Administered 2021-01-31 (×2): 10 mg via INTRAVENOUS
  Filled 2021-01-26 (×3): qty 4

## 2021-01-26 MED ORDER — HYDRALAZINE HCL 20 MG/ML IJ SOLN
10.0000 mg | Freq: Four times a day (QID) | INTRAMUSCULAR | Status: DC | PRN
  Administered 2021-01-26 – 2021-01-29 (×2): 10 mg via INTRAVENOUS
  Filled 2021-01-26: qty 1

## 2021-01-26 MED ORDER — MELATONIN 3 MG PO TABS
3.0000 mg | ORAL_TABLET | Freq: Every day | ORAL | Status: DC
  Administered 2021-01-27: 3 mg via ORAL

## 2021-01-26 MED ORDER — HALOPERIDOL LACTATE 5 MG/ML IJ SOLN: 2.0000 mg | Freq: Four times a day (QID) | INTRAMUSCULAR | Status: DC | PRN

## 2021-01-26 MED ORDER — POTASSIUM CHLORIDE 10 MEQ/100ML IV SOLN
INTRAVENOUS | Status: AC
  Administered 2021-01-26: 10 meq
  Filled 2021-01-26: qty 100

## 2021-01-26 MED ORDER — POTASSIUM CHLORIDE 10 MEQ/100ML IV SOLN
INTRAVENOUS | Status: AC
  Filled 2021-01-26: qty 100

## 2021-01-26 MED ORDER — CLOPIDOGREL BISULFATE 75 MG PO TABS
75.0000 mg | ORAL_TABLET | Freq: Every day | ORAL | Status: DC
  Administered 2021-01-27: 75 mg via ORAL
  Filled 2021-01-26: qty 1

## 2021-01-26 MED ORDER — ENOXAPARIN SODIUM 40 MG/0.4ML IJ SOSY
PREFILLED_SYRINGE | INTRAMUSCULAR | Status: AC
  Filled 2021-01-26: qty 0.4

## 2021-01-26 MED ORDER — POTASSIUM CHLORIDE CRYS ER 20 MEQ PO TBCR: 40.0000 meq | EXTENDED_RELEASE_TABLET | Freq: Two times a day (BID) | ORAL | Status: DC

## 2021-01-26 MED ORDER — HALOPERIDOL LACTATE 5 MG/ML IJ SOLN
2.0000 mg | Freq: Four times a day (QID) | INTRAMUSCULAR | Status: DC | PRN
  Administered 2021-01-26: 2 mg via INTRAVENOUS

## 2021-01-26 MED ORDER — POTASSIUM CHLORIDE CRYS ER 20 MEQ PO TBCR: 40.0000 meq | EXTENDED_RELEASE_TABLET | Freq: Once | ORAL | Status: DC

## 2021-01-26 MED ORDER — HALOPERIDOL LACTATE 5 MG/ML IJ SOLN
2.0000 mg | Freq: Once | INTRAMUSCULAR | Status: AC
  Administered 2021-01-26: 2 mg via INTRAMUSCULAR

## 2021-01-26 MED ORDER — INSULIN ASPART 100 UNIT/ML IJ SOLN
0.0000 [IU] | INTRAMUSCULAR | Status: DC
Start: 2021-01-26 — End: 2021-02-12
  Administered 2021-01-26: 1 [IU] via SUBCUTANEOUS
  Administered 2021-01-27: 2 [IU] via SUBCUTANEOUS
  Administered 2021-01-28: 1 [IU] via SUBCUTANEOUS
  Administered 2021-01-29 (×4): 2 [IU] via SUBCUTANEOUS
  Administered 2021-01-30: 1 [IU] via SUBCUTANEOUS
  Administered 2021-01-30: 2 [IU] via SUBCUTANEOUS
  Administered 2021-01-30: 1 [IU] via SUBCUTANEOUS
  Administered 2021-01-30: 3 [IU] via SUBCUTANEOUS
  Administered 2021-01-31 (×4): 1 [IU] via SUBCUTANEOUS
  Administered 2021-01-31: 17:00:00 2 [IU] via SUBCUTANEOUS
  Administered 2021-02-01 (×2): 1 [IU] via SUBCUTANEOUS
  Administered 2021-02-01 (×4): 2 [IU] via SUBCUTANEOUS
  Administered 2021-02-02: 3 [IU] via SUBCUTANEOUS
  Administered 2021-02-02 – 2021-02-06 (×13): 2 [IU] via SUBCUTANEOUS
  Administered 2021-02-06: 3 [IU] via SUBCUTANEOUS
  Administered 2021-02-07: 2 [IU] via SUBCUTANEOUS
  Administered 2021-02-07: 3 [IU] via SUBCUTANEOUS
  Administered 2021-02-07: 1 [IU] via SUBCUTANEOUS
  Administered 2021-02-07 – 2021-02-08 (×4): 2 [IU] via SUBCUTANEOUS
  Administered 2021-02-08: 3 [IU] via SUBCUTANEOUS
  Administered 2021-02-08: 2 [IU] via SUBCUTANEOUS
  Administered 2021-02-08: 3 [IU] via SUBCUTANEOUS
  Administered 2021-02-08 – 2021-02-09 (×3): 2 [IU] via SUBCUTANEOUS
  Administered 2021-02-09 – 2021-02-10 (×4): 1 [IU] via SUBCUTANEOUS
  Administered 2021-02-10: 18:00:00 3 [IU] via SUBCUTANEOUS
  Administered 2021-02-10 (×2): 1 [IU] via SUBCUTANEOUS
  Administered 2021-02-11 (×6): 2 [IU] via SUBCUTANEOUS
  Administered 2021-02-11: 1 [IU] via SUBCUTANEOUS
  Administered 2021-02-12 (×2): 2 [IU] via SUBCUTANEOUS
  Administered 2021-02-12: 1 [IU] via SUBCUTANEOUS
  Administered 2021-02-12: 2 [IU] via SUBCUTANEOUS
  Filled 2021-01-26: qty 0.09

## 2021-01-26 MED ORDER — POTASSIUM CHLORIDE 10 MEQ/100ML IV SOLN
10.0000 meq | INTRAVENOUS | Status: AC
  Administered 2021-01-26 (×2): 10 meq via INTRAVENOUS

## 2021-01-26 MED ORDER — ENOXAPARIN SODIUM 30 MG/0.3ML IJ SOSY
PREFILLED_SYRINGE | INTRAMUSCULAR | Status: AC
  Filled 2021-01-26: qty 0.3

## 2021-01-26 MED ORDER — ATENOLOL 25 MG PO TABS
50.0000 mg | ORAL_TABLET | Freq: Every day | ORAL | Status: DC
  Administered 2021-01-27: 50 mg via ORAL
  Filled 2021-01-26: qty 1

## 2021-01-26 MED ORDER — ACETAMINOPHEN 650 MG RE SUPP: 650.0000 mg | Freq: Four times a day (QID) | RECTAL | Status: DC | PRN

## 2021-01-26 MED ORDER — ACETAMINOPHEN 325 MG PO TABS: 650.0000 mg | ORAL_TABLET | Freq: Four times a day (QID) | ORAL | Status: DC | PRN

## 2021-01-26 MED ORDER — ENOXAPARIN SODIUM 30 MG/0.3ML IJ SOSY
30.0000 mg | PREFILLED_SYRINGE | INTRAMUSCULAR | Status: DC
  Administered 2021-01-26 – 2021-01-27 (×2): 30 mg via SUBCUTANEOUS
  Filled 2021-01-26: qty 0.3

## 2021-01-26 NOTE — Progress Notes (Signed)
PT Cancellation Note  Patient Details Name: Rhonda Winters MRN: [id number] DOB: [birth year]   Cancelled Treatment:    Reason Eval/Treat Not Completed: Medical issues which prohibited therapy;Patient not medically ready (RN reports pt with significant AMS requiring 4 point restraints. Will follow up at later date/time as schedule allows and pt able.)   Gwynneth Albright PT, DPT Acute Rehabilitation Services Office [phone] Pager [phone]

## 2021-01-26 NOTE — Progress Notes (Signed)
RN called MD Venetia Constable to clarify K-rider orders as there are two different orders in. Dr. Venetia Constable wants Potassium 44mEq/100ml x 5 bags. Two bags were given in the ED. This RN informed him that she just started bag #3. Also notified MD that pt too lethargic to take PO Atenolol and PO Plavix. MD reports to hold. RN will pass along to night shift.

## 2021-01-26 NOTE — ED Notes (Signed)
Pt re-adjusted in ED stretcher. Restraints remain in place.

## 2021-01-26 NOTE — ED Notes (Signed)
Pt re-adjusted in ED stretcher. Soft restraints remain in place to bilateral upper and lower extremities. Pt oriented to person alone. 5L [place] applied. VSS at this time.

## 2021-01-26 NOTE — ED Notes (Signed)
Pt returned to ED exam room 16 without oxygen. This nurse re-applied nasal cannula 6L and pt now satting at 91%.

## 2021-01-26 NOTE — Progress Notes (Signed)
This RN had Charge RN Rise Paganini witness Haldol override in Pyxis due to server errors.

## 2021-01-26 NOTE — ED Notes (Signed)
Dr. Olevia Bowens notified, pt remains agitated, has been readjusted multiple times in ED stretcher despite 4-point soft restraints. Essentially is requiring one-one-one care at this time. HR at 97 currently, satting at 92% on 5L,. Unable to complete pt's potassium runs given she has infiltrated multiple IV's and will not keep her right arm straight despite restraint use. Hospitalist notified and aware. No further orders at this time. Will continue to monitor.

## 2021-01-26 NOTE — Progress Notes (Addendum)
[facility] PROGRESS NOTE    Progress Note  Rhonda Winters  JSE:[id number] DOB: Jun 07, 1945 DOA: 01/25/2021 PCP: Curly Rim, MD     Brief Narrative:   Rhonda Winters is an 75 y.o. female past medical history of a recent stroke in May, diabetes mellitus type 2, essential hypertension brought into the ED as was found confused as per family that started 3 days prior to admission.  In the ED a chest x-ray showed right lower lobe pneumonia was mildly hypotensive she was started on fluid resuscitation and empiric antibiotics White blood cell count was 13 hemoglobin of 11 CT of the head was unremarkable admitted for sepsis secondary to pneumonia influenza and SARS-CoV-2 PCR have been negative.   Assessment/Plan:   Sepsis ([place]) likely secondary to pneumonia: Was hypotensive on admission was started on fluid resuscitation. Started empirically on Rocephin and azithromycin and cultures have been ordered. Since admission has stabilized tachycardia has resolved she is requiring 5 L of oxygen to keep saturations greater 92%.  Acute kidney injury: With a baseline creatinine of 0.5 on admission 2.6 likely due to sepsis. Was started IV fluid resuscitation recheck basic metabolic panel in the morning.  Acute metabolic encephalopathy: Likely due to infectious etiology.  Remains encephalopathic only alert to person. MRI of the brain negative foe acute CVA. Right hip x-ray showed questionable non displaced fracture check dedicated right hip x-ray.  Essential hypertension: The patient is currently n.p.o. blood pressure is improved after IV fluid hydration. Started on hydralazine IV as needed, if blood pressure continues to be elevated throughout tomorrow can restart antihypertensive medication.  Diabetes mellitus type 2: Continue sliding scale insulin, blood glucose is relatively control.  Macrocytic anemia: Anemia panel has been checked.  Hypokalemia: Replete orally recheck  tomorrow morning.  DVT prophylaxis: lovenox Family Communication:none Status is: Inpatient  Remains inpatient appropriate because: Sepsis due to pneumonia and acute kidney injury with acute metabolic encephalopathy.        Code Status:     Code Status Orders  (From admission, onward)           Start     Ordered   01/26/21 0301  Full code  Continuous        01/26/21 0302           Code Status History     Date Active Date Inactive Code Status Order ID Comments User Context   06/29/2020 1817 07/01/2020 2223 Full Code [id number]  Maudie Mercury, MD ED   12/26/2019 2128 12/30/2019 2042 Full Code [id number]  Patrecia Pour, MD Inpatient         IV Access:   Peripheral IV   Procedures and diagnostic studies:   DG Chest 2 View  Result Date: 01/25/2021 CLINICAL DATA:  Sepsis, UTI EXAM: CHEST - 2 VIEW COMPARISON:  Radiograph 12/26/2019 FINDINGS: Normal cardiac silhouette.  Low lung volumes. Dense peripheral consolidation in the RIGHT upper lobe and to a lesser degree the RIGHT lower lobe. LEFT lung clear. IMPRESSION: Dense RIGHT upper lobe consolidation is favored pneumonia. Cannot exclude a pulmonary mass. Followup PA and lateral chest X-ray is recommended in 3-4 weeks following trial of antibiotic therapy to ensure resolution and exclude underlying malignancy. Electronically Signed   By: Suzy Bouchard M.D.   On: 01/25/2021 21:25   DG Pelvis 1-2 Views  Result Date: 01/26/2021 CLINICAL DATA:  Worsening confusion after a recent fall. EXAM: PELVIS - 1-2 VIEW COMPARISON:  None. FINDINGS: No evidence of fracture of the  pelvic bones. Question nondisplaced fracture of the greater trochanter of the right femur. I would suggest dedicated right hip radiographs. IMPRESSION: Question nondisplaced fracture of the right femur in the greater trochanteric region. Recommend dedicated right hip radiographs to evaluate. Electronically Signed   By: Nelson Chimes M.D.   On: 01/26/2021 07:28    CT Head Wo Contrast  Result Date: 01/26/2021 CLINICAL DATA:  Initial evaluation for acute head trauma, altered mental status. EXAM: CT HEAD WITHOUT CONTRAST TECHNIQUE: Contiguous axial images were obtained from the base of the skull through the vertex without intravenous contrast. COMPARISON:  Prior studies from 06/29/2020. FINDINGS: Brain: Cerebral volume within normal limits for age. Moderate chronic microvascular ischemic disease noted. Interval evolution of right PCA territory infarct, now largely chronic in appearance. No acute intracranial hemorrhage. No acute large vessel territory infarct. No mass lesion or midline shift. No hydrocephalus or extra-axial fluid collection. Vascular: No hyperdense vessel. Calcified atherosclerosis present at skull base. Skull: No scalp soft tissue abnormality.  Calvarium intact. Sinuses/Orbits: Globes normal soft tissues demonstrate no acute finding. Paranasal sinuses and mastoid air cells are clear. Other: None. IMPRESSION: 1. No acute intracranial abnormality. 2. Interval evolution of right PCA territory infarct, now chronic in appearance. 3. Underlying moderate chronic microvascular ischemic disease. Electronically Signed   By: Jeannine Boga M.D.   On: 01/26/2021 00:01     Medical Consultants:   None.   Subjective:    Rhonda Winters confused this morning not hungry  Objective:    Vitals:   01/26/21 0052 01/26/21 0230 01/26/21 0330 01/26/21 0520  BP: (!) 133/92 92/67 127/70 128/86  Pulse: 78 74 76 85  Resp: 20 (!) 24 (!) 29 19  Temp:      TempSrc:      SpO2: 92% 94% 96% 92%  Weight:      Height:       SpO2: 92 % O2 Flow Rate (L/min): 5 L/min   Intake/Output Summary (Last 24 hours) at 01/26/2021 0733 Last data filed at 01/26/2021 0644 Gross per 24 hour  Intake 1450 ml  Output --  Net 1450 ml   Filed Weights   01/25/21 2027  Weight: 92.5 kg    Exam: General exam: In no acute distress. Respiratory system: Good air  movement and clear to auscultation. Cardiovascular system: S1 & S2 heard, RRR. No JVD. Gastrointestinal system: Abdomen is nondistended, soft and nontender.  Central nervous system: Moving all 4 extremities, not able to follow command alert and oriented x1 Extremities: No pedal edema. Skin: No rashes, lesions or ulcers Psychiatry: No judgment or insight of medical condition.   Data Reviewed:    Labs: Basic Metabolic Panel: Recent Labs  Lab 01/25/21 2048 01/26/21 0302  NA 135 136  K 3.1* 3.4*  CL 96* 100  CO2 24 23  GLUCOSE 115* 116*  BUN 83* 79*  CREATININE 2.64* 2.20*  CALCIUM 9.6 8.9   GFR Estimated Creatinine Clearance: 23.4 mL/min (A) (by C-G formula based on SCr of 2.2 mg/dL (H)). Liver Function Tests: Recent Labs  Lab 01/25/21 2048 01/26/21 0302  AST 17 28  ALT 20 22  ALKPHOS 70 77  BILITOT 1.0 0.9  PROT 7.6 6.7  ALBUMIN 3.4* 3.0*   No results for input(s): LIPASE, AMYLASE in the last 168 hours. No results for input(s): AMMONIA in the last 168 hours. Coagulation profile Recent Labs  Lab 01/25/21 2048  INR 1.1   COVID-19 Labs  No results for input(s): DDIMER, FERRITIN, LDH, CRP  in the last 72 hours.  Lab Results  Component Value Date   SARSCOV2NAA NEGATIVE 01/25/2021   SARSCOV2NAA NEGATIVE 06/29/2020   SARSCOV2NAA POSITIVE (A) 12/26/2019    CBC: Recent Labs  Lab 01/25/21 2048 01/26/21 0302  WBC 12.9* 11.1*  NEUTROABS 11.9* 10.3*  HGB 11.4* 10.8*  HCT 35.1* 32.5*  MCV 100.6* 101.6*  PLT 212 209   Cardiac Enzymes: No results for input(s): CKTOTAL, CKMB, CKMBINDEX, TROPONINI in the last 168 hours. BNP (last 3 results) No results for input(s): PROBNP in the last 8760 hours. CBG: Recent Labs  Lab 01/25/21 2032 01/26/21 0327 01/26/21 0636  GLUCAP 97 106* 105*   D-Dimer: No results for input(s): DDIMER in the last 72 hours. Hgb A1c: No results for input(s): HGBA1C in the last 72 hours. Lipid Profile: No results for input(s): CHOL,  HDL, LDLCALC, TRIG, CHOLHDL, LDLDIRECT in the last 72 hours. Thyroid function studies: No results for input(s): TSH, T4TOTAL, T3FREE, THYROIDAB in the last 72 hours.  Invalid input(s): FREET3 Anemia work up: No results for input(s): VITAMINB12, FOLATE, FERRITIN, TIBC, IRON, RETICCTPCT in the last 72 hours. Sepsis Labs: Recent Labs  Lab 01/25/21 2048 01/25/21 2250 01/26/21 0302  PROCALCITON  --   --  27.56  WBC 12.9*  --  11.1*  LATICACIDVEN 2.5* 1.3 1.7   Microbiology Recent Results (from the past 240 hour(s))  Resp Panel by RT-PCR (Flu A&B, Covid) Nasopharyngeal Swab     Status: None   Collection Time: 01/25/21  9:26 PM   Specimen: Nasopharyngeal Swab; Nasopharyngeal(NP) swabs in vial transport medium  Result Value Ref Range Status   SARS Coronavirus 2 by RT PCR NEGATIVE NEGATIVE Final    Comment: (NOTE) SARS-CoV-2 target nucleic acids are NOT DETECTED.  The SARS-CoV-2 RNA is generally detectable in upper respiratory specimens during the acute phase of infection. The lowest concentration of SARS-CoV-2 viral copies this assay can detect is 138 copies/mL. A negative result does not preclude SARS-Cov-2 infection and should not be used as the sole basis for treatment or other patient management decisions. A negative result may occur with  improper specimen collection/handling, submission of specimen other than nasopharyngeal swab, presence of viral mutation(s) within the areas targeted by this assay, and inadequate number of viral copies(<138 copies/mL). A negative result must be combined with clinical observations, patient history, and epidemiological information. The expected result is Negative.  Fact Sheet for Patients:  EntrepreneurPulse.com.au  Fact Sheet for Healthcare Providers:  IncredibleEmployment.be  This test is no t yet approved or cleared by the [place] FDA and  has been authorized for detection and/or diagnosis of  SARS-CoV-2 by FDA under an Emergency Use Authorization (EUA). This EUA will remain  in effect (meaning this test can be used) for the duration of the COVID-19 declaration under Section 564(b)(1) of the Act, 21 U.S.C.section 360bbb-3(b)(1), unless the authorization is terminated  or revoked sooner.       Influenza A by PCR NEGATIVE NEGATIVE Final   Influenza B by PCR NEGATIVE NEGATIVE Final    Comment: (NOTE) The Xpert Xpress SARS-CoV-2/FLU/RSV plus assay is intended as an aid in the diagnosis of influenza from Nasopharyngeal swab specimens and should not be used as a sole basis for treatment. Nasal washings and aspirates are unacceptable for Xpert Xpress SARS-CoV-2/FLU/RSV testing.  Fact Sheet for Patients: EntrepreneurPulse.com.au  Fact Sheet for Healthcare Providers: IncredibleEmployment.be  This test is not yet approved or cleared by the [place] FDA and has been authorized for detection and/or diagnosis  of SARS-CoV-2 by FDA under an Emergency Use Authorization (EUA). This EUA will remain in effect (meaning this test can be used) for the duration of the COVID-19 declaration under Section 564(b)(1) of the Act, 21 U.S.C. section 360bbb-3(b)(1), unless the authorization is terminated or revoked.  Performed at [facility], [place] [address], [place], [place] [address]      Medications:    enoxaparin (LOVENOX) injection  30 mg Subcutaneous Q24H   insulin aspart  0-9 Units Subcutaneous Q4H   levothyroxine  62.5 mcg Intravenous Daily   Continuous Infusions:  azithromycin Stopped (01/26/21 0139)   cefTRIAXone (ROCEPHIN)  IV Stopped (01/26/21 0015)   lactated ringers 150 mL/hr at 01/25/21 2346      LOS: 0 days   Charlynne Cousins  [facility]  01/26/2021, 7:33 AM

## 2021-01-26 NOTE — H&P (Addendum)
History and Physical    Rhonda Winters YKD:[id number] DOB: Dec 13, 1945 DOA: 01/25/2021  PCP: Curly Rim, MD  Patient coming from: Home.  Chief Complaint: Altered mental status.  HPI: Rhonda Winters is a 75 y.o. female with history of recent stroke in May of this year, diabetes mellitus, hypertension, hyperlipidemia, hypothyroidism was brought to the ER after patient has been found that patient was increasingly confused for the last 3 days.  Patient had a fall and since then patient has been found to be increasingly confused.  ED Course: In the ER patient was confused and agitated had to be placed on restraints.  Chest x-ray shows right upper lobe pneumonia.  Patient was initially hypotensive improved with fluids.  Lactic acid was elevated.  Improved with fluids.  WBC was 12.9 hemoglobin is 11.4 with microcytic picture.  CT head is unremarkable.  Patient admitted for sepsis secondary to pneumonia.  COVID test and influenza are negative.  EKG shows normal sinus rhythm.  ABG shows pH of 7.28 and PCO2 of 49.  Review of Systems: As per HPI, rest all negative.   Past Medical History:  Diagnosis Date   Acquired hypothyroidism    Hypertension    Stroke ([place])    Type 2 diabetes mellitus ([place])     Past Surgical History:  Procedure Laterality Date   LOOP RECORDER INSERTION N/A 07/01/2020   Procedure: LOOP RECORDER INSERTION;  Surgeon: Constance Haw, MD;  Location: [facility];  Service: Cardiovascular;  Laterality: N/A;     reports that she quit smoking about 32 years ago. Her smoking use included cigarettes. She has never used smokeless tobacco. She reports current alcohol use. She reports that she does not use drugs.  Allergies  Allergen Reactions   Hydrocodone-Acetaminophen Nausea And Vomiting    History reviewed. No pertinent family history.  Prior to Admission medications   Medication Sig Start Date End Date Taking? Authorizing Provider  acetaminophen  (TYLENOL) 500 MG tablet Take 1,000 mg by mouth every 6 (six) hours as needed for moderate pain.   Yes [provider]  amitriptyline (ELAVIL) 25 MG tablet Take 25 mg by mouth at bedtime. 11/18/19  Yes [provider]  ascorbic acid (VITAMIN C) 500 MG tablet Take 1 tablet (500 mg total) by mouth daily. 12/31/19  Yes Pokhrel, Laxman, MD  aspirin 325 MG tablet Take 1 tablet (325 mg total) by mouth daily. 07/01/20  Yes Iona Beard, MD  atenolol (TENORMIN) 50 MG tablet Take 50 mg by mouth daily. 11/11/19  Yes [provider]  BIOTIN PO Take 1 tablet by mouth daily.   Yes [provider]  cetirizine (ZYRTEC) 10 MG tablet Take 10 mg by mouth daily as needed for allergies.   Yes [provider]  clopidogrel (PLAVIX) 75 MG tablet Take 1 tablet (75 mg total) by mouth daily. 07/01/20  Yes Iona Beard, MD  diclofenac (VOLTAREN) 75 MG EC tablet Take 75 mg by mouth 2 (two) times daily. 11/18/19  Yes [provider]  DULoxetine (CYMBALTA) 60 MG capsule Take 60 mg by mouth daily. 11/18/19  Yes [provider]  gabapentin (NEURONTIN) 800 MG tablet Take 800 mg by mouth 3 (three) times daily. 11/18/19  Yes [provider]  hydrochlorothiazide (HYDRODIURIL) 50 MG tablet Take 50 mg by mouth daily. 11/18/19  Yes [provider]  levothyroxine (SYNTHROID) 125 MCG tablet Take 125 mcg by mouth daily before breakfast.   Yes [provider]  losartan (COZAAR) 50 MG tablet Take 1 tablet (50 mg total) by mouth daily. 07/01/20 01/26/22 Yes Iona Beard, MD  metFORMIN (GLUCOPHAGE-XR) 500 MG 24 hr tablet Take 500-1,000 mg by mouth See admin instructions. 11/18/19  Yes [provider]  vitamin E 180 MG (400 UNITS) capsule Take 400 Units by mouth daily.   Yes [provider]  atorvastatin (LIPITOR) 80 MG tablet Take 1 tablet (80 mg total) by mouth daily. Patient not taking: Reported on 01/26/2021 07/01/20   Iona Beard, MD   furosemide (LASIX) 20 MG tablet Take 20 mg by mouth daily as needed for fluid.  Patient not taking: Reported on 01/26/2021 08/21/19   [provider]    Physical Exam: Constitutional: Moderately built and nourished. Vitals:   01/25/21 2243 01/26/21 0015 01/26/21 0052 01/26/21 0230  BP: (!) 154/86 (!) 142/79 (!) 133/92 92/67  Pulse: 69 75 78 74  Resp: 17 11 20  (!) 24  Temp:      TempSrc:      SpO2: (!) 78% 94% 92% 94%  Weight:      Height:       Eyes: Anicteric no pallor. ENMT: No discharge from the ears eyes nose and mouth. Neck: No mass felt.  No neck rigidity. Respiratory: No rhonchi or crepitations. Cardiovascular: S1-S2 heard. Abdomen: Soft nontender bowel sound present. Musculoskeletal: No edema. Skin: No rash. Neurologic: Alert awake but oriented does not follow commands moves all extremities.  Pupils are equal and reacting to light. Psychiatric: Appears confused.   Labs on Admission: I have personally reviewed following labs and imaging studies  CBC: Recent Labs  Lab 01/25/21 2048  WBC 12.9*  NEUTROABS 11.9*  HGB 11.4*  HCT 35.1*  MCV 100.6*  PLT 856   Basic Metabolic Panel: Recent Labs  Lab 01/25/21 2048  NA 135  K 3.1*  CL 96*  CO2 24  GLUCOSE 115*  BUN 83*  CREATININE 2.64*  CALCIUM 9.6   GFR: Estimated Creatinine Clearance: 19.5 mL/min (A) (by C-G formula based on SCr of 2.64 mg/dL (H)). Liver Function Tests: Recent Labs  Lab 01/25/21 2048  AST 17  ALT 20  ALKPHOS 70  BILITOT 1.0  PROT 7.6  ALBUMIN 3.4*   No results for input(s): LIPASE, AMYLASE in the last 168 hours. No results for input(s): AMMONIA in the last 168 hours. Coagulation Profile: Recent Labs  Lab 01/25/21 2048  INR 1.1   Cardiac Enzymes: No results for input(s): CKTOTAL, CKMB, CKMBINDEX, TROPONINI in the last 168 hours. BNP (last 3 results) No results for input(s): PROBNP in the last 8760 hours. HbA1C: No results for input(s): HGBA1C in the last 72  hours. CBG: Recent Labs  Lab 01/25/21 2032  GLUCAP 97   Lipid Profile: No results for input(s): CHOL, HDL, LDLCALC, TRIG, CHOLHDL, LDLDIRECT in the last 72 hours. Thyroid Function Tests: No results for input(s): TSH, T4TOTAL, FREET4, T3FREE, THYROIDAB in the last 72 hours. Anemia Panel: No results for input(s): VITAMINB12, FOLATE, FERRITIN, TIBC, IRON, RETICCTPCT in the last 72 hours. Urine analysis:    Component Value Date/Time   COLORURINE AMBER (A) 01/26/2021 0100   APPEARANCEUR CLOUDY (A) 01/26/2021 0100   LABSPEC 1.026 01/26/2021 0100   PHURINE 5.0 01/26/2021 0100   GLUCOSEU NEGATIVE 01/26/2021 0100   HGBUR NEGATIVE 01/26/2021 0100   BILIRUBINUR NEGATIVE 01/26/2021 0100   KETONESUR NEGATIVE 01/26/2021 0100   PROTEINUR 30 (A) 01/26/2021 0100   NITRITE NEGATIVE 01/26/2021 0100   LEUKOCYTESUR NEGATIVE 01/26/2021 0100  Sepsis Labs: @LABRCNTIP (procalcitonin:4,lacticidven:4) ) Recent Results (from the past 240 hour(s))  Resp Panel by RT-PCR (Flu A&B, Covid) Nasopharyngeal Swab     Status: None   Collection Time: 01/25/21  9:26 PM   Specimen: Nasopharyngeal Swab; Nasopharyngeal(NP) swabs in vial transport medium  Result Value Ref Range Status   SARS Coronavirus 2 by RT PCR NEGATIVE NEGATIVE Final    Comment: (NOTE) SARS-CoV-2 target nucleic acids are NOT DETECTED.  The SARS-CoV-2 RNA is generally detectable in upper respiratory specimens during the acute phase of infection. The lowest concentration of SARS-CoV-2 viral copies this assay can detect is 138 copies/mL. A negative result does not preclude SARS-Cov-2 infection and should not be used as the sole basis for treatment or other patient management decisions. A negative result may occur with  improper specimen collection/handling, submission of specimen other than nasopharyngeal swab, presence of viral mutation(s) within the areas targeted by this assay, and inadequate number of viral copies(<138 copies/mL). A  negative result must be combined with clinical observations, patient history, and epidemiological information. The expected result is Negative.  Fact Sheet for Patients:  EntrepreneurPulse.com.au  Fact Sheet for Healthcare Providers:  IncredibleEmployment.be  This test is no t yet approved or cleared by the [place] FDA and  has been authorized for detection and/or diagnosis of SARS-CoV-2 by FDA under an Emergency Use Authorization (EUA). This EUA will remain  in effect (meaning this test can be used) for the duration of the COVID-19 declaration under Section 564(b)(1) of the Act, 21 U.S.C.section 360bbb-3(b)(1), unless the authorization is terminated  or revoked sooner.       Influenza A by PCR NEGATIVE NEGATIVE Final   Influenza B by PCR NEGATIVE NEGATIVE Final    Comment: (NOTE) The Xpert Xpress SARS-CoV-2/FLU/RSV plus assay is intended as an aid in the diagnosis of influenza from Nasopharyngeal swab specimens and should not be used as a sole basis for treatment. Nasal washings and aspirates are unacceptable for Xpert Xpress SARS-CoV-2/FLU/RSV testing.  Fact Sheet for Patients: EntrepreneurPulse.com.au  Fact Sheet for Healthcare Providers: IncredibleEmployment.be  This test is not yet approved or cleared by the [place] FDA and has been authorized for detection and/or diagnosis of SARS-CoV-2 by FDA under an Emergency Use Authorization (EUA). This EUA will remain in effect (meaning this test can be used) for the duration of the COVID-19 declaration under Section 564(b)(1) of the Act, 21 U.S.C. section 360bbb-3(b)(1), unless the authorization is terminated or revoked.  Performed at [facility], [address], [place],  [address]      Radiological Exams on Admission: DG Chest 2 View  Result Date: 01/25/2021 CLINICAL DATA:  Sepsis, UTI EXAM: CHEST - 2 VIEW  COMPARISON:  Radiograph 12/26/2019 FINDINGS: Normal cardiac silhouette.  Low lung volumes. Dense peripheral consolidation in the RIGHT upper lobe and to a lesser degree the RIGHT lower lobe. LEFT lung clear. IMPRESSION: Dense RIGHT upper lobe consolidation is favored pneumonia. Cannot exclude a pulmonary mass. Followup PA and lateral chest X-ray is recommended in 3-4 weeks following trial of antibiotic therapy to ensure resolution and exclude underlying malignancy. Electronically Signed   By: Suzy Bouchard M.D.   On: 01/25/2021 21:25   CT Head Wo Contrast  Result Date: 01/26/2021 CLINICAL DATA:  Initial evaluation for acute head trauma, altered mental status. EXAM: CT HEAD WITHOUT CONTRAST TECHNIQUE: Contiguous axial images were obtained from the base of the skull through the vertex without intravenous contrast. COMPARISON:  Prior studies from 06/29/2020. FINDINGS: Brain:  Cerebral volume within normal limits for age. Moderate chronic microvascular ischemic disease noted. Interval evolution of right PCA territory infarct, now largely chronic in appearance. No acute intracranial hemorrhage. No acute large vessel territory infarct. No mass lesion or midline shift. No hydrocephalus or extra-axial fluid collection. Vascular: No hyperdense vessel. Calcified atherosclerosis present at skull base. Skull: No scalp soft tissue abnormality.  Calvarium intact. Sinuses/Orbits: Globes normal soft tissues demonstrate no acute finding. Paranasal sinuses and mastoid air cells are clear. Other: None. IMPRESSION: 1. No acute intracranial abnormality. 2. Interval evolution of right PCA territory infarct, now chronic in appearance. 3. Underlying moderate chronic microvascular ischemic disease. Electronically Signed   By: Jeannine Boga M.D.   On: 01/26/2021 00:01    EKG: Independently reviewed.  Normal sinus rhythm.  Assessment/Plan Principal Problem:   Sepsis ([place]) Active Problems:   HTN (hypertension)   Diabetic  neuropathy associated with type 2 diabetes mellitus ([place])   Hypothyroidism   CVA (cerebral vascular accident) ([place])   CAP (community acquired pneumonia)   Acute encephalopathy   ARF (acute renal failure) ([place])    Sepsis likely from pneumonia.  At presentation patient was hypotensive confused leukocytosis with elevated lactic acid consistent with sepsis physiology.  Patient has been placed on empiric antibiotics for pneumonia since patient's chest x-ray shows dense consolidation.  Follow-up blood cultures and if possible sputum cultures.  Continue with hydration. Acute encephalopathy suspect likely from sepsis.  However since patient has a recent stroke we will get MRI brain. Hypertension we will keep patient p.o. and IV hydralazine until patient can take orally. Hypothyroidism on Synthroid presently dosed with IV. Diabetes mellitus type 2 we will keep patient on sliding scale coverage. Recent stroke was negative.. Acute renal failure with creatinine worsening from 0.7 in May it is around 2.6.  Could be from sepsis and hypotension.  We will gently hydrate and follow metabolic panel.  UA is unremarkable. Macrocytic anemia -check anemia panel with next blood draw.  Follow CBC. Mild hypokalemia replace and recheck.  This patient has sepsis with encephalopathy acute renal failure will need close monitoring and further management inpatient status.  Since as per the report patient had a fall prior to coming to the hospital and details are not clear I have ordered x-ray pelvis which is pending.   DVT prophylaxis: Lovenox. Code Status: Full code. Family Communication: We will need to discuss with family. Disposition Plan: Home. Consults called: None. Admission status: Inpatient.   Rise Patience MD [facility] Pager [phone].  If 7PM-7AM, please contact night-coverage [email or web address] Password [email or web address]  01/26/2021, 3:02 AM

## 2021-01-26 NOTE — ED Notes (Signed)
Hospitalist at the bedside 

## 2021-01-26 NOTE — Progress Notes (Signed)
RN has continuously been at bedside due to patient trying to pull new IV lines out and get OOB.

## 2021-01-26 NOTE — ED Notes (Signed)
Pt back from MRI at this time

## 2021-01-26 NOTE — Progress Notes (Signed)
RN had to override potassium k-rider and PRN Hydralazine to be able to administer due to server errors in the pyxis. Charge RN, Myriam Jacobson verifying witness with this RN.

## 2021-01-26 NOTE — ED Notes (Signed)
Patient attempted to slide out of bed and pull at tubing. [place] placed on patient for safety.

## 2021-01-26 NOTE — ED Notes (Signed)
MRI tech at bedside to take pt.

## 2021-01-26 NOTE — ED Notes (Signed)
Pt to MRI at this time.

## 2021-01-26 NOTE — ED Notes (Addendum)
Pt readjusted in ED stretcher multiple times. Will not keep O2 on face and pulls at cardiac monitoring leads. Floor nurse Meryle Ready, aware pt requires remaining potassium runs given difficulty with peripheral IV d/t pt's agitation.

## 2021-01-26 NOTE — Progress Notes (Signed)
PHARMACY - PHYSICIAN COMMUNICATION CRITICAL VALUE ALERT - BLOOD CULTURE IDENTIFICATION (BCID)  Rhonda Winters is an 75 y.o. female who presented to [facility] on 01/25/2021 with a chief complaint of altered mental status  Assessment:  3/4 bottles Strep pneumo, no resistance  Name of physician (or Provider) Contacted: Aileen Fass via [facility]  Current antibiotics: Ceftriaxone & azithromycin for CAP  Changes to prescribed antibiotics recommended:  Rec keep ceftriaxone 2 gm IV q24 & stop azithromycin  Results for orders placed or performed during the hospital encounter of 01/25/21  Blood Culture ID Panel (Reflexed) (Collected: 01/25/2021  8:48 PM)  Result Value Ref Range   Enterococcus faecalis NOT DETECTED NOT DETECTED   Enterococcus Faecium NOT DETECTED NOT DETECTED   Listeria monocytogenes NOT DETECTED NOT DETECTED   Staphylococcus species NOT DETECTED NOT DETECTED   Staphylococcus aureus (BCID) NOT DETECTED NOT DETECTED   Staphylococcus epidermidis NOT DETECTED NOT DETECTED   Staphylococcus lugdunensis NOT DETECTED NOT DETECTED   Streptococcus species DETECTED (A) NOT DETECTED   Streptococcus agalactiae NOT DETECTED NOT DETECTED   Streptococcus pneumoniae DETECTED (A) NOT DETECTED   Streptococcus pyogenes NOT DETECTED NOT DETECTED   A.calcoaceticus-baumannii NOT DETECTED NOT DETECTED   Bacteroides fragilis NOT DETECTED NOT DETECTED   Enterobacterales NOT DETECTED NOT DETECTED   Enterobacter cloacae complex NOT DETECTED NOT DETECTED   Escherichia coli NOT DETECTED NOT DETECTED   Klebsiella aerogenes NOT DETECTED NOT DETECTED   Klebsiella oxytoca NOT DETECTED NOT DETECTED   Klebsiella pneumoniae NOT DETECTED NOT DETECTED   Proteus species NOT DETECTED NOT DETECTED   Salmonella species NOT DETECTED NOT DETECTED   Serratia marcescens NOT DETECTED NOT DETECTED   Haemophilus influenzae NOT DETECTED NOT DETECTED   Neisseria meningitidis NOT DETECTED NOT DETECTED   Pseudomonas  aeruginosa NOT DETECTED NOT DETECTED   Stenotrophomonas maltophilia NOT DETECTED NOT DETECTED   Candida albicans NOT DETECTED NOT DETECTED   Candida auris NOT DETECTED NOT DETECTED   Candida glabrata NOT DETECTED NOT DETECTED   Candida krusei NOT DETECTED NOT DETECTED   Candida parapsilosis NOT DETECTED NOT DETECTED   Candida tropicalis NOT DETECTED NOT DETECTED   Cryptococcus neoformans/gattii NOT DETECTED NOT DETECTED    Eudelia Bunch, Pharm.D 01/26/2021 1:39 PM

## 2021-01-27 ENCOUNTER — Inpatient Hospital Stay (HOSPITAL_COMMUNITY): Payer: Medicare Other

## 2021-01-27 DIAGNOSIS — G9340 Encephalopathy, unspecified: Secondary | ICD-10-CM

## 2021-01-27 DIAGNOSIS — A419 Sepsis, unspecified organism: Secondary | ICD-10-CM

## 2021-01-27 DIAGNOSIS — J189 Pneumonia, unspecified organism: Secondary | ICD-10-CM | POA: Diagnosis not present

## 2021-01-27 LAB — BLOOD GAS, ARTERIAL
Acid-base deficit: 3.1 mmol/L — ABNORMAL HIGH (ref 0.0–2.0)
Acid-base deficit: 1.4 mmol/L (ref 0.0–2.0)
Bicarbonate: 23 mmol/L (ref 20.0–28.0)
Bicarbonate: 23.5 mmol/L (ref 20.0–28.0)
Drawn by: 560031
Drawn by: 331471
FIO2: 100
FIO2: 100
MECHVT: 400 mL
O2 Content: 15 L/min
O2 Saturation: 88.1 %
O2 Saturation: 99.5 %
PEEP: 5 cmH2O
Patient temperature: 98.6
Patient temperature: 98.6
RATE: 26 resp/min
pCO2 arterial: 48.5 mmHg — ABNORMAL HIGH (ref 32.0–48.0)
pCO2 arterial: 43.3 mmHg (ref 32.0–48.0)
pH, Arterial: 7.296 — ABNORMAL LOW (ref 7.350–7.450)
pH, Arterial: 7.354 (ref 7.350–7.450)
pO2, Arterial: 65.7 mmHg — ABNORMAL LOW (ref 83.0–108.0)
pO2, Arterial: 194 mmHg — ABNORMAL HIGH (ref 83.0–108.0)

## 2021-01-27 LAB — GLUCOSE, CAPILLARY
Glucose-Capillary: 101 mg/dL — ABNORMAL HIGH (ref 70–99)
Glucose-Capillary: 109 mg/dL — ABNORMAL HIGH (ref 70–99)
Glucose-Capillary: 109 mg/dL — ABNORMAL HIGH (ref 70–99)
Glucose-Capillary: 112 mg/dL — ABNORMAL HIGH (ref 70–99)
Glucose-Capillary: 166 mg/dL — ABNORMAL HIGH (ref 70–99)
Glucose-Capillary: 82 mg/dL (ref 70–99)

## 2021-01-27 LAB — BASIC METABOLIC PANEL
Anion gap: 12 (ref 5–15)
BUN: 53 mg/dL — ABNORMAL HIGH (ref 8–23)
CO2: 21 mmol/L — ABNORMAL LOW (ref 22–32)
Calcium: 9.7 mg/dL (ref 8.9–10.3)
Chloride: 106 mmol/L (ref 98–111)
Creatinine, Ser: 1.1 mg/dL — ABNORMAL HIGH (ref 0.44–1.00)
GFR, Estimated: 52 mL/min — ABNORMAL LOW (ref >60)
Glucose, Bld: 114 mg/dL — ABNORMAL HIGH (ref 70–99)
Potassium: 2.9 mmol/L — ABNORMAL LOW (ref 3.5–5.1)
Sodium: 139 mmol/L (ref 135–145)

## 2021-01-27 LAB — BODY FLUID CELL COUNT WITH DIFFERENTIAL
Eos, Fluid: 0 %
Lymphs, Fluid: 0 %
Monocyte-Macrophage-Serous Fluid: 5 % — ABNORMAL LOW (ref 50–90)
Neutrophil Count, Fluid: 95 % — ABNORMAL HIGH (ref 0–25)
Total Nucleated Cell Count, Fluid: 540 cu mm (ref 0–1000)

## 2021-01-27 LAB — LEGIONELLA PNEUMOPHILA SEROGP 1 UR AG: L. pneumophila Serogp 1 Ur Ag: NEGATIVE

## 2021-01-27 LAB — MRSA NEXT GEN BY PCR, NASAL: MRSA by PCR Next Gen: NOT DETECTED

## 2021-01-27 MED ORDER — FLUMAZENIL 0.5 MG/5ML IV SOLN
INTRAVENOUS | Status: AC
  Filled 2021-01-27: qty 5

## 2021-01-27 MED ORDER — VANCOMYCIN HCL 1750 MG/350ML IV SOLN
1750.0000 mg | Freq: Once | INTRAVENOUS | Status: AC
  Administered 2021-01-27: 1750 mg via INTRAVENOUS
  Filled 2021-01-27: qty 350

## 2021-01-27 MED ORDER — THIAMINE HCL 100 MG/ML IJ SOLN
100.0000 mg | Freq: Every day | INTRAMUSCULAR | Status: DC
  Administered 2021-01-28 – 2021-01-31 (×3): 100 mg via INTRAVENOUS
  Filled 2021-01-27 (×3): qty 2

## 2021-01-27 MED ORDER — FLUMAZENIL 0.5 MG/5ML IV SOLN: 0.2000 mg | Freq: Once | INTRAVENOUS | Status: DC

## 2021-01-27 MED ORDER — ADULT MULTIVITAMIN LIQUID CH
15.0000 mL | Freq: Every day | ORAL | Status: DC
  Administered 2021-01-28 – 2021-02-11 (×15): 15 mL
  Filled 2021-01-27 (×15): qty 15

## 2021-01-27 MED ORDER — PANTOPRAZOLE SODIUM 40 MG IV SOLR
40.0000 mg | Freq: Every day | INTRAVENOUS | Status: DC
  Administered 2021-01-27 – 2021-02-11 (×16): 40 mg via INTRAVENOUS
  Filled 2021-01-27 (×16): qty 40

## 2021-01-27 MED ORDER — ETOMIDATE 2 MG/ML IV SOLN
20.0000 mg | Freq: Once | INTRAVENOUS | Status: AC
  Administered 2021-01-27: 20 mg via INTRAVENOUS

## 2021-01-27 MED ORDER — MELATONIN 3 MG PO TABS
3.0000 mg | ORAL_TABLET | Freq: Every day | ORAL | Status: DC
Start: 2021-01-27 — End: 2021-02-09
  Administered 2021-01-27 – 2021-02-08 (×13): 3 mg
  Filled 2021-01-27 (×15): qty 1

## 2021-01-27 MED ORDER — THIAMINE HCL 100 MG PO TABS
100.0000 mg | ORAL_TABLET | Freq: Every day | ORAL | Status: DC
  Administered 2021-01-29 – 2021-02-03 (×4): 100 mg
  Filled 2021-01-27 (×4): qty 1

## 2021-01-27 MED ORDER — LORAZEPAM 1 MG PO TABS: 1.0000 mg | ORAL_TABLET | ORAL | Status: DC | PRN

## 2021-01-27 MED ORDER — THIAMINE HCL 100 MG/ML IJ SOLN: 100.0000 mg | Freq: Every day | INTRAMUSCULAR | Status: DC

## 2021-01-27 MED ORDER — FENTANYL CITRATE (PF) 100 MCG/2ML IJ SOLN: 100.0000 ug | Freq: Once | INTRAMUSCULAR | Status: AC

## 2021-01-27 MED ORDER — FENTANYL CITRATE (PF) 100 MCG/2ML IJ SOLN
50.0000 ug | INTRAMUSCULAR | Status: DC | PRN
  Administered 2021-01-27 – 2021-01-29 (×8): 100 ug via INTRAVENOUS
  Administered 2021-01-30 (×3): 50 ug via INTRAVENOUS
  Administered 2021-01-31: 20:00:00 100 ug via INTRAVENOUS
  Administered 2021-01-31: 05:00:00 75 ug via INTRAVENOUS
  Administered 2021-01-31: 19:00:00 50 ug via INTRAVENOUS
  Administered 2021-01-31: 06:00:00 75 ug via INTRAVENOUS
  Administered 2021-02-01: 50 ug via INTRAVENOUS
  Administered 2021-02-01: 100 ug via INTRAVENOUS
  Filled 2021-01-27 (×3): qty 2
  Filled 2021-01-27: qty 4
  Filled 2021-01-27: qty 2

## 2021-01-27 MED ORDER — SODIUM CHLORIDE 0.9 % IV SOLN
2.0000 g | Freq: Two times a day (BID) | INTRAVENOUS | Status: DC
  Administered 2021-01-27 – 2021-02-01 (×12): 2 g via INTRAVENOUS
  Filled 2021-01-27 (×14): qty 20

## 2021-01-27 MED ORDER — POTASSIUM CHLORIDE CRYS ER 20 MEQ PO TBCR
40.0000 meq | EXTENDED_RELEASE_TABLET | ORAL | Status: AC
  Administered 2021-01-27 (×2): 40 meq via ORAL
  Filled 2021-01-27 (×2): qty 2

## 2021-01-27 MED ORDER — VANCOMYCIN HCL 750 MG/150ML IV SOLN
750.0000 mg | Freq: Two times a day (BID) | INTRAVENOUS | Status: DC
Start: 2021-01-28 — End: 2021-01-28
  Administered 2021-01-28: 750 mg via INTRAVENOUS
  Filled 2021-01-27 (×2): qty 150

## 2021-01-27 MED ORDER — SODIUM CHLORIDE 0.9 % IV SOLN: INTRAVENOUS | Status: AC

## 2021-01-27 MED ORDER — PROPOFOL 1000 MG/100ML IV EMUL
5.0000 ug/kg/min | INTRAVENOUS | Status: DC
  Administered 2021-01-28: 10 ug/kg/min via INTRAVENOUS
  Administered 2021-01-28: 5 ug/kg/min via INTRAVENOUS
  Administered 2021-01-29: 40 ug/kg/min via INTRAVENOUS
  Administered 2021-01-29: 20 ug/kg/min via INTRAVENOUS
  Administered 2021-01-29: 30 ug/kg/min via INTRAVENOUS
  Administered 2021-01-29: 20 ug/kg/min via INTRAVENOUS
  Administered 2021-01-30: 30 ug/kg/min via INTRAVENOUS
  Administered 2021-01-30 – 2021-01-31 (×2): 35 ug/kg/min via INTRAVENOUS
  Administered 2021-01-31 (×2): 15 ug/kg/min via INTRAVENOUS
  Administered 2021-02-01: 30 ug/kg/min via INTRAVENOUS
  Filled 2021-01-27 (×16): qty 100

## 2021-01-27 MED ORDER — MIDAZOLAM HCL 2 MG/2ML IJ SOLN
2.0000 mg | Freq: Once | INTRAMUSCULAR | Status: AC
  Administered 2021-01-27: 2 mg via INTRAVENOUS

## 2021-01-27 MED ORDER — FOLIC ACID 1 MG PO TABS
1.0000 mg | ORAL_TABLET | Freq: Every day | ORAL | Status: DC
  Administered 2021-01-28 – 2021-02-12 (×16): 1 mg
  Filled 2021-01-27 (×16): qty 1

## 2021-01-27 MED ORDER — LORAZEPAM 2 MG/ML IJ SOLN
1.0000 mg | INTRAMUSCULAR | Status: DC | PRN
  Administered 2021-01-27: 4 mg via INTRAVENOUS

## 2021-01-27 MED ORDER — FOLIC ACID 1 MG PO TABS: 1.0000 mg | ORAL_TABLET | Freq: Every day | ORAL | Status: DC

## 2021-01-27 MED ORDER — CHLORHEXIDINE GLUCONATE CLOTH 2 % EX PADS
6.0000 | MEDICATED_PAD | Freq: Every day | CUTANEOUS | Status: DC
  Administered 2021-01-27 – 2021-02-12 (×19): 6 via TOPICAL

## 2021-01-27 MED ORDER — CLOPIDOGREL BISULFATE 75 MG PO TABS
75.0000 mg | ORAL_TABLET | Freq: Every day | ORAL | Status: DC
Start: 2021-01-28 — End: 2021-02-01
  Administered 2021-01-28 – 2021-02-01 (×5): 75 mg
  Filled 2021-01-27 (×5): qty 1

## 2021-01-27 MED ORDER — LORAZEPAM 2 MG/ML IJ SOLN
0.0000 mg | Freq: Two times a day (BID) | INTRAMUSCULAR | Status: DC
Start: 2021-01-29 — End: 2021-01-27

## 2021-01-27 MED ORDER — DOCUSATE SODIUM 50 MG/5ML PO LIQD
100.0000 mg | Freq: Two times a day (BID) | ORAL | Status: DC
  Administered 2021-01-27 – 2021-02-08 (×14): 100 mg
  Filled 2021-01-27 (×16): qty 10

## 2021-01-27 MED ORDER — ADULT MULTIVITAMIN W/MINERALS CH: 1.0000 | ORAL_TABLET | Freq: Every day | ORAL | Status: DC

## 2021-01-27 MED ORDER — DEXMEDETOMIDINE HCL IN NACL 200 MCG/50ML IV SOLN
0.0000 ug/kg/h | INTRAVENOUS | Status: DC
  Administered 2021-01-27 – 2021-01-28 (×3): 0.4 ug/kg/h via INTRAVENOUS
  Administered 2021-01-28 (×2): 0.6 ug/kg/h via INTRAVENOUS
  Administered 2021-01-28: 0.7 ug/kg/h via INTRAVENOUS
  Filled 2021-01-27 (×6): qty 50

## 2021-01-27 MED ORDER — LEVOTHYROXINE SODIUM 25 MCG PO TABS: 125.0000 ug | ORAL_TABLET | Freq: Every day | ORAL | Status: DC

## 2021-01-27 MED ORDER — ROCURONIUM BROMIDE 10 MG/ML (PF) SYRINGE
60.0000 mg | PREFILLED_SYRINGE | Freq: Once | INTRAVENOUS | Status: AC
  Administered 2021-01-27: 60 mg via INTRAVENOUS
  Filled 2021-01-27: qty 6

## 2021-01-27 MED ORDER — ENOXAPARIN SODIUM 30 MG/0.3ML IJ SOSY
30.0000 mg | PREFILLED_SYRINGE | INTRAMUSCULAR | Status: DC
Start: 2021-01-28 — End: 2021-01-27

## 2021-01-27 MED ORDER — FLUMAZENIL 0.5 MG/5ML IV SOLN: 0.2000 mg | Freq: Once | INTRAVENOUS | Status: DC | PRN

## 2021-01-27 MED ORDER — LEVOTHYROXINE SODIUM 25 MCG PO TABS
125.0000 ug | ORAL_TABLET | Freq: Every day | ORAL | Status: DC
  Administered 2021-01-28 – 2021-02-12 (×16): 125 ug
  Filled 2021-01-27 (×16): qty 1

## 2021-01-27 MED ORDER — ENOXAPARIN SODIUM 30 MG/0.3ML IJ SOSY: 30.0000 mg | PREFILLED_SYRINGE | INTRAMUSCULAR | Status: DC

## 2021-01-27 MED ORDER — THIAMINE HCL 100 MG PO TABS: 100.0000 mg | ORAL_TABLET | Freq: Every day | ORAL | Status: DC

## 2021-01-27 MED ORDER — LORAZEPAM 2 MG/ML IJ SOLN
0.0000 mg | Freq: Four times a day (QID) | INTRAMUSCULAR | Status: DC
  Filled 2021-01-27: qty 2

## 2021-01-27 MED ORDER — POLYETHYLENE GLYCOL 3350 17 G PO PACK
17.0000 g | PACK | Freq: Every day | ORAL | Status: DC
  Administered 2021-01-27 – 2021-02-08 (×6): 17 g
  Filled 2021-01-27 (×6): qty 1

## 2021-01-27 MED ORDER — FENTANYL CITRATE (PF) 100 MCG/2ML IJ SOLN
50.0000 ug | INTRAMUSCULAR | Status: AC | PRN
  Administered 2021-01-27 (×3): 50 ug via INTRAVENOUS
  Filled 2021-01-27 (×3): qty 2

## 2021-01-27 NOTE — Progress Notes (Signed)
Pharmacy Antibiotic Note  Rhonda Winters is a 75 y.o. female admitted on 01/25/2021 with bacteremia and meningitis.  Pharmacy has been consulted for Vancomycin dosing.  Admit SCr 2.64 >> decreased to 1.1 WBC 11K, Afebrile Encephalopathic, intubated, r/o mengitis LP performed prior to start of Vancomycin  Plan: Ceftriaxone 2gm q12 Vancomycin 1744m x1, then [id number]  Height: _0  (157.5 cm) Weight: 92.5 kg (204 lb) IBW/kg (Calculated) : 50.1  Temp (24hrs), Avg:98.1 F (36.7 C), Min:97.6 F (36.4 C), Max:98.5 F (36.9 C)  Recent Labs  Lab 01/25/21 2048 01/25/21 2250 01/26/21 0302 01/26/21 1847 01/27/21 0450  WBC 12.9*  --  11.1*  --   --   CREATININE 2.64*  --  2.20* 1.56* 1.10*  LATICACIDVEN 2.5* 1.3 1.7  --   --     Estimated Creatinine Clearance: 46.8 mL/min (A) (by C-G formula based on SCr of 1.1 mg/dL (H)).    Allergies  Allergen Reactions   Hydrocodone-Acetaminophen Nausea And Vomiting    Antimicrobials this admission: 11/28 Azithromycin >> 11/30 11/28 Ceftriaxone >>  11/30 Vancomycin >>  Dose adjustments this admission:  Microbiology results: 11/28 BCx: BCID 11/29 in 4/4 bottles - Strep pneumo, sens pending 11/30 Lumbar puncture: sent  11/30 BAL: sent  11/29 Strep pneumo Ag urine: neg 11/29 Legionella: neg  Thank you for allowing pharmacy to be a part of this patient's care.  [name]1/30/2022 2:44 PM

## 2021-01-27 NOTE — Progress Notes (Signed)
1125-Pt's husband called RN for an update on the patient. At this time, the patient was yelling and trying to climb over the side rails of the bed. RN asked husband, Nada Boozer, what the patients normal behavior was. Nada Boozer reports that she can typically feed and dress herself and that she is "fairly independent." Nada Boozer reports that she drinks a "moderate amount of alcohol." He also reports that she may not drink every day but "she drinks a moderate amount throughout the week." Nada Boozer denies any smoking or drug use for Mrs. Byrd. Nada Boozer reports he is not coming to visit today because "I seemed to make her agitation worse when she was in the Emergency Room yesterday, but please call me if needed and I will head there." Once this RN hung up the phone with Nada Boozer, I placed a secure chat message into Dr. Florene Glen letting him know all of the above that was reported by husband, [place].   1145- RN at bedside with 1:1 sitter. Pt trying to climb out of the bed and is talking to people that are not present in the room. She is also yelling for "daddy." Pt very diaphoretic and displaying significant tremors. RN obtained vitals and received a BP of 190's/110's on the left upper arm and 170's/110's on the left lower forearm. RN obtained a CIWA score of 27 and notified Dr. Florene Glen face-to-face of CIWA score as well as HTN. RN informed Dr. Florene Glen that according to the CIWA guidelines a score >20 calls for a "rapid response." Dr. Florene Glen also notified that the patient had Haldol yesterday and last night which reportedly made her behaviors worse per night shift RN and night shift 1:1 sitter. Dr. Florene Glen ordered to give the 4mg  of Ativan per the CIWA protocol and monitor the patient. Patient was hooked up to continuous pulse ox which displayed 97-99% on 5L O2 via [place] and she was already on telemetry monitoring.   1200-RN gave 4mg  of Ativan via IV per MD order. After administration, the patient became minimally responsive to deep sternal rub. MD was  still on the floor and was immediately called to the patients room. Romazicon 0.2mg  IV given. Pt became a little more responsive. O2 sats were between 84-93% and patient was placed on a non-rebreather. Rapid response team at the bedside. Pt's BP 170's/110's. Second dose of Romazicon 0.2mg  IV was given without any response.   1220-Patient was transferred to ICU with this RN, rapid response team, and Dr. Florene Glen at the bedside. Report given to [place], ICU RN.

## 2021-01-27 NOTE — Progress Notes (Addendum)
PROGRESS NOTE    Rhonda Winters  VHQ:[id number] DOB: 01/01/1946 DOA: 01/25/2021 PCP: Curly Rim, MD   Chief Complaint  Patient presents with   Altered Mental Status   Brief Narrative:  75 yo with hx stroke in May, T2DM, HTN who presented with acute metabolic encephalopathy in the setting of community acquired pneumonia.  She been found to have strep pneumoniae bacteremia.  She's declined 11/30 after dose of ativan, with worsening acute hypoxic and hypercarbic resp failure requiring intubation.  Addendum - intubated and on critical care service, will sign off - please let us know when we can be of further assistance.  Assessment & Plan:   Principal Problem:   Sepsis ([place]) Active Problems:   HTN (hypertension)   Diabetic neuropathy associated with type 2 diabetes mellitus ([place])   Hypothyroidism   CVA (cerebral vascular accident) ([place])   CAP (community acquired pneumonia)   Acute encephalopathy   ARF (acute renal failure) ([facility])  Acute Hypoxic Respiratory Failure 2/2 CAP Worsening today after dose of ativan Flumazenil x3 0.2 mg  Transfer to stepdown, PCCM consult, she'll need intubation  Strep Pneumoniae Bacteremia  Community Acquired Pneumonia  Sepsis Blood cultures with strep pneumoniae on ceftriaxone CXR with RUL consolidation, needs follow up imaging in 3-4 weeks, can't exclude pulm mass Concern for meningitis, adjust abx, discussed with PCCM  Acute Metabolic Encephalopathy Likely in the setting of infection above MRI without acute intracranial pathology She'd been receiving haldol intermittently, received ativan and declined this morning With encephalopathy and strep pneumoniae bacteremia, concern for meningitis as well, discussed with PCCM, will adjust abx  HTN Prn labetalol  AKI Improving - 2.64 at presentation  Leukocytosis 2/2 infection above  Hypokalemia Replace and follow  Right PCA Stroke Plavix, apparently not taking  statin  Hypothyroidism Synthroid   Concern for nondisplaced fracture of right femur in greater trochanteric region Right femur plain film without acute abnormality  There was concern for etoh abuse/withdrawal which is why she received dose of ativan today, but on further discussion with her husband today, it sounds like her etoh use is not daily and maybe 1-2x/week, low suspicion for etoh abuse/withdrawal.  DVT prophylaxis: lovenox Code Status: full  Family Communication: discussed with husband over phone - he's Shaunta Oncale pharmacist - discussed plan for intubation, decline after ativan Disposition:   Status is: Inpatient  Remains inpatient appropriate because: resp failure       Consultants:  PCCM  Procedures:  none  Antimicrobials:  Anti-infectives (From admission, onward)    Start     Dose/Rate Route Frequency Ordered Stop   01/25/21 2325  sodium chloride 0.9 % with cefTRIAXone (ROCEPHIN) ADS Med       Note to Pharmacy: Chestine Spore Taji Barretto: cabinet override      01/25/21 2325 01/26/21 1129   01/25/21 2325  sodium chloride 0.9 % with azithromycin (ZITHROMAX) ADS Med       Note to Pharmacy: Chest[facility] Spore Daniesha Driver: cabinet override      01/25/21 2325 01/26/21 1129   01/25/21 2315  cefTRIAXone (ROCEPHIN) 2 g in sodium chloride 0.9 % 100 mL IVPB        2 g 200 mL/hr over 30 Minutes Intravenous Every 24 hours 01/25/21 2310 01/30/21 2314   01/25/21 2315  azithromycin (ZITHROMAX) 500 mg in sodium chloride 0.9 % 250 mL IVPB  Status:  Discontinued        500 mg 250 mL/hr over 60 Minutes Intravenous Every 24 hours 01/25/21 2310 01/27/21 1111  Subjective: Confused, follows commands  Objective: Vitals:   01/26/21 2106 01/27/21 0010 01/27/21 0511 01/27/21 1207  BP: (!) 181/109 (!) 145/102 (!) 166/115 (!) 152/108  Pulse: 100 99 93 82  Resp: (!) 22 18 (!) 22   Temp: 98.2 F (36.8 C) 98.5 F (36.9 C) 98 F (36.7 C)   TempSrc: Oral Axillary    SpO2: 92% 97% 91% (!) 73%  Weight:       Height:        Intake/Output Summary (Last 24 hours) at 01/27/2021 1243 Last data filed at 01/27/2021 1043 Gross per 24 hour  Intake 950 ml  Output 1025 ml  Net -75 ml   Filed Weights   01/25/21 2027  Weight: 92.5 kg    Examination:  General exam: agitated, confused Respiratory system: unlabored on 5 L Cardiovascular system: RRR Gastrointestinal system: Abdomen is nondistended, soft and nontender. Central nervous system: encephalopathic, PERRL, moving all extremities, following commands Extremities: no LEE  Data Reviewed: I have personally reviewed following labs and imaging studies  CBC: Recent Labs  Lab 01/25/21 2048 01/26/21 0302  WBC 12.9* 11.1*  NEUTROABS 11.9* 10.3*  HGB 11.4* 10.8*  HCT 35.1* 32.5*  MCV 100.6* 101.6*  PLT 212 161    Basic Metabolic Panel: Recent Labs  Lab 01/25/21 2048 01/26/21 0302 01/26/21 1847 01/27/21 0450  NA 135 136 141 139  K 3.1* 3.4* 2.9* 2.9*  CL 96* 100 104 106  CO2 24 23 21* 21*  GLUCOSE 115* 116* 106* 114*  BUN 83* 79* 66* 53*  CREATININE 2.64* 2.20* 1.56* 1.10*  CALCIUM 9.6 8.9 9.5 9.7    GFR: Estimated Creatinine Clearance: 46.8 mL/min (Dawan Farney) (by C-G formula based on SCr of 1.1 mg/dL (H)).  Liver Function Tests: Recent Labs  Lab 01/25/21 2048 01/26/21 0302  AST 17 28  ALT 20 22  ALKPHOS 70 77  BILITOT 1.0 0.9  PROT 7.6 6.7  ALBUMIN 3.4* 3.0*    CBG: Recent Labs  Lab 01/26/21 1954 01/26/21 2342 01/27/21 0358 01/27/21 0747 01/27/21 1120  GLUCAP 122* 127* 101* 109* 166*     Recent Results (from the past 240 hour(s))  Culture, blood (Routine x 2)     Status: Abnormal (Preliminary result)   Collection Time: 01/25/21  8:48 PM   Specimen: BLOOD  Result Value Ref Range Status   Specimen Description   Final    BLOOD RIGHT ANTECUBITAL Performed at [facility], [place] [address], [place], [place] [address]    Special Requests   Final    BOTTLES DRAWN AEROBIC AND ANAEROBIC Blood  Culture adequate volume Performed at [facility] [address], [place], [place] [address]    Culture  Setup Time   Final    GRAM POSITIVE COCCI IN PAIRS AND CHAINS IN BOTH AEROBIC AND ANAEROBIC BOTTLES CRITICAL RESULT CALLED TO, READ BACK BY AND VERIFIED WITHAudree Camel PHARMD 1331 01/26/21 Felisa Zechman BROWNING    Culture (Jassmin Kemmerer)  Final    STREPTOCOCCUS PNEUMONIAE SUSCEPTIBILITIES TO FOLLOW Performed at [facility], [place] [address] [place], [place] [address]    Report Status PENDING  Incomplete  Blood Culture ID Panel (Reflexed)     Status: Abnormal   Collection Time: 01/25/21  8:48 PM  Result Value Ref Range Status   Enterococcus faecalis NOT DETECTED NOT DETECTED Final   Enterococcus Faecium NOT DETECTED NOT DETECTED Final   Listeria monocytogenes NOT DETECTED NOT DETECTED Final   Staphylococcus species NOT DETECTED  NOT DETECTED Final   Staphylococcus aureus (BCID) NOT DETECTED NOT DETECTED Final   Staphylococcus epidermidis NOT DETECTED NOT DETECTED Final   Staphylococcus lugdunensis NOT DETECTED NOT DETECTED Final   Streptococcus species DETECTED (Yasir Kitner) NOT DETECTED Final    Comment: CRITICAL RESULT CALLED TO, READ BACK BY AND VERIFIED WITH: N GOOGOVAC PHARMD 1331 01/26/21 Juaquina Machnik BROWNING    Streptococcus agalactiae NOT DETECTED NOT DETECTED Final   Streptococcus pneumoniae DETECTED (Farley Crooker) NOT DETECTED Final    Comment: CRITICAL RESULT CALLED TO, READ BACK BY AND VERIFIED WITH: N GOOGOVAC PHARMD 1331 01/26/21 Duane Earnshaw BROWNING    Streptococcus pyogenes NOT DETECTED NOT DETECTED Final   Markita Stcharles.calcoaceticus-baumannii NOT DETECTED NOT DETECTED Final   Bacteroides fragilis NOT DETECTED NOT DETECTED Final   Enterobacterales NOT DETECTED NOT DETECTED Final   Enterobacter cloacae complex NOT DETECTED NOT DETECTED Final   Escherichia coli NOT DETECTED NOT DETECTED Final   Klebsiella aerogenes NOT DETECTED NOT DETECTED Final   Klebsiella oxytoca NOT DETECTED NOT DETECTED Final    Klebsiella pneumoniae NOT DETECTED NOT DETECTED Final   Proteus species NOT DETECTED NOT DETECTED Final   Salmonella species NOT DETECTED NOT DETECTED Final   Serratia marcescens NOT DETECTED NOT DETECTED Final   Haemophilus influenzae NOT DETECTED NOT DETECTED Final   Neisseria meningitidis NOT DETECTED NOT DETECTED Final   Pseudomonas aeruginosa NOT DETECTED NOT DETECTED Final   Stenotrophomonas maltophilia NOT DETECTED NOT DETECTED Final   Candida albicans NOT DETECTED NOT DETECTED Final   Candida auris NOT DETECTED NOT DETECTED Final   Candida glabrata NOT DETECTED NOT DETECTED Final   Candida krusei NOT DETECTED NOT DETECTED Final   Candida parapsilosis NOT DETECTED NOT DETECTED Final   Candida tropicalis NOT DETECTED NOT DETECTED Final   Cryptococcus neoformans/gattii NOT DETECTED NOT DETECTED Final    Comment: Performed at [facility], [place] [address], [place],  [address]  Resp Panel by RT-PCR (Flu Monay Houlton&B, Covid) Nasopharyngeal Swab     Status: None   Collection Time: 01/25/21  9:26 PM   Specimen: Nasopharyngeal Swab; Nasopharyngeal(NP) swabs in vial transport medium  Result Value Ref Range Status   SARS Coronavirus 2 by RT PCR NEGATIVE NEGATIVE Final    Comment: (NOTE) SARS-CoV-2 target nucleic acids are NOT DETECTED.  The SARS-CoV-2 RNA is generally detectable in upper respiratory specimens during the acute phase of infection. The lowest concentration of SARS-CoV-2 viral copies this assay can detect is 138 copies/mL. Mathis Cashman negative result does not preclude SARS-Cov-2 infection and should not be used as the sole basis for treatment or other patient management decisions. Anniebell Bedore negative result may occur with  improper specimen collection/handling, submission of specimen other than nasopharyngeal swab, presence of viral mutation(s) within the areas targeted by this assay, and inadequate number of viral copies(<138 copies/mL). Contina Strain negative result must be combined with clinical  observations, patient history, and epidemiological information. The expected result is Negative.  Fact Sheet for Patients:  EntrepreneurPulse.com.au  Fact Sheet for Healthcare Providers:  IncredibleEmployment.be  This test is no t yet approved or cleared by the [place] FDA and  has been authorized for detection and/or diagnosis of SARS-CoV-2 by FDA under an Emergency Use Authorization (EUA). This EUA will remain  in effect (meaning this test can be used) for the duration of the COVID-19 declaration under Section 564(b)(1) of the Act, 21 U.S.C.section 360bbb-3(b)(1), unless the authorization is terminated  or revoked sooner.       Influenza Adonay Scheier by PCR NEGATIVE NEGATIVE Final  Influenza B by PCR NEGATIVE NEGATIVE Final    Comment: (NOTE) The Xpert Xpress SARS-CoV-2/FLU/RSV plus assay is intended as an aid in the diagnosis of influenza from Nasopharyngeal swab specimens and should not be used as [place] sole basis for treatment. Nasal washings and aspirates are unacceptable for Xpert Xpress SARS-CoV-2/FLU/RSV testing.  Fact Sheet for Patients: EntrepreneurPulse.com.au  Fact Sheet for Healthcare Providers: IncredibleEmployment.be  This test is not yet approved or cleared by the [place] FDA and has been authorized for detection and/or diagnosis of SARS-CoV-2 by FDA under an Emergency Use Authorization (EUA). This EUA will remain in effect (meaning this test can be used) for the duration of the COVID-19 declaration under Section 564(b)(1) of the Act, 21 U.S.C. section 360bbb-3(b)(1), unless the authorization is terminated or revoked.  Performed at [facility], [place] [address] [place], [place] [address]   Culture, blood (Routine x 2)     Status: Abnormal (Preliminary result)   Collection Time: 01/25/21 10:37 PM   Specimen: BLOOD  Result Value Ref Range Status   Specimen Description    Final    BLOOD BLOOD LEFT FOREARM Performed at [facility] [address], [place], [place] [address]    Special Requests   Final    BOTTLES DRAWN AEROBIC AND ANAEROBIC Blood Culture results may not be optimal due to an inadequate volume of blood received in culture bottles Performed at [facility] [address] [place], [place] [address]    Culture  Setup Time   Final    GRAM POSITIVE COCCI IN PAIRS AND CHAINS IN BOTH AEROBIC AND ANAEROBIC BOTTLES CRITICAL VALUE NOTED.  VALUE IS CONSISTENT WITH PREVIOUSLY REPORTED AND CALLED VALUE. Performed at [facility], [place] [address], [place], [place] [address]    Culture STREPTOCOCCUS PNEUMONIAE (Kalecia Hartney)  Final   Report Status PENDING  Incomplete         Radiology Studies: DG Chest 2 View  Result Date: 01/25/2021 CLINICAL DATA:  Sepsis, UTI EXAM: CHEST - 2 VIEW COMPARISON:  Radiograph 12/26/2019 FINDINGS: Normal cardiac silhouette.  Low lung volumes. Dense peripheral consolidation in the RIGHT upper lobe and to Alveta Quintela lesser degree the RIGHT lower lobe. LEFT lung clear. IMPRESSION: Dense RIGHT upper lobe consolidation is favored pneumonia. Cannot exclude Tuvia Woodrick pulmonary mass. Followup PA and lateral chest X-ray is recommended in 3-4 weeks following trial of antibiotic therapy to ensure resolution and exclude underlying malignancy. Electronically Signed   By: Suzy Bouchard M.D.   On: 01/25/2021 21:25   DG Pelvis 1-2 Views  Result Date: 01/26/2021 CLINICAL DATA:  Worsening confusion after Carless Slatten recent fall. EXAM: PELVIS - 1-2 VIEW COMPARISON:  None. FINDINGS: No evidence of fracture of the pelvic bones. Question nondisplaced fracture of the greater trochanter of the right femur. I would suggest dedicated right hip radiographs. IMPRESSION: Question nondisplaced fracture of the right femur in the greater trochanteric region. Recommend dedicated right hip radiographs to evaluate. Electronically Signed   By: Nelson Chimes M.D.   On: 01/26/2021 07:28   CT Head Wo Contrast  Result Date: 01/26/2021 CLINICAL DATA:  Initial evaluation for acute head trauma, altered mental status. EXAM: CT HEAD WITHOUT CONTRAST TECHNIQUE: Contiguous axial images were obtained from the base of the skull through the vertex without intravenous contrast. COMPARISON:  Prior studies from 06/29/2020. FINDINGS: Brain: Cerebral volume within normal limits for age. Moderate chronic microvascular ischemic disease noted. Interval evolution of right PCA territory infarct, now largely chronic  in appearance. No acute intracranial hemorrhage. No acute large vessel territory infarct. No mass lesion or midline shift. No hydrocephalus or extra-axial fluid collection. Vascular: No hyperdense vessel. Calcified atherosclerosis present at skull base. Skull: No scalp soft tissue abnormality.  Calvarium intact. Sinuses/Orbits: Globes normal soft tissues demonstrate no acute finding. Paranasal sinuses and mastoid air cells are clear. Other: None. IMPRESSION: 1. No acute intracranial abnormality. 2. Interval evolution of right PCA territory infarct, now chronic in appearance. 3. Underlying moderate chronic microvascular ischemic disease. Electronically Signed   By: Jeannine Boga M.D.   On: 01/26/2021 00:01   MR BRAIN WO CONTRAST  Result Date: 01/26/2021 CLINICAL DATA:  Altered mental status, fall 2 days ago EXAM: MRI HEAD WITHOUT CONTRAST TECHNIQUE: Multiplanar, multiecho pulse sequences of the brain and surrounding structures were obtained without intravenous contrast. COMPARISON:  Noncontrast CT head dated 1 day prior FINDINGS: Brain: There is no evidence of acute intracranial hemorrhage, extra-axial fluid collection, or acute infarct. There is Alexandr Yaworski remote right PCA territorial infarct with associated encephalomalacia and gliosis. There is mild ex vacuo dilatation of the right occipital horn. The ventricles are otherwise normal in size. There is Majed Pellegrin background  of mild global parenchymal volume loss. Patchy FLAIR signal abnormality throughout the subcortical and periventricular white matter likely reflects sequela of moderate chronic white matter microangiopathy. There is no solid mass lesion.  There is no midline shift. Vascular: Normal flow voids. Skull and upper cervical spine: Normal marrow signal. Sinuses/Orbits: The paranasal sinuses are clear. Bilateral lens implants are in place. The globes and orbits are otherwise unremarkable. Other: None. IMPRESSION: 1. No acute intracranial pathology. 2. Remote right PCA territorial infarct and moderate chronic white matter microangiopathy. Electronically Signed   By: Valetta Mole M.D.   On: 01/26/2021 15:24   DG FEMUR, MIN 2 VIEWS RIGHT  Result Date: 01/26/2021 CLINICAL DATA:  Pain, question fracture, bruising at lateral RIGHT hip, slight anterior RIGHT knee abrasion EXAM: RIGHT FEMUR 2 VIEWS COMPARISON:  None FINDINGS: Osseous demineralization. Minimal knee joint space narrowing. Hip joint space preserved. No acute fracture, dislocation, or bone destruction. Visualized RIGHT hemipelvis intact. IMPRESSION: No acute abnormalities. Electronically Signed   By: Lavonia Dana M.D.   On: 01/26/2021 18:40        Scheduled Meds:  atenolol  50 mg Oral Daily   clopidogrel  75 mg Oral Daily   enoxaparin (LOVENOX) injection  30 mg Subcutaneous Q24H   flumazenil       flumazenil       flumazenil  0.2 mg Intravenous Once   folic acid  1 mg Oral Daily   insulin aspart  0-9 Units Subcutaneous Q4H   [START ON 01/28/2021] levothyroxine  125 mcg Oral Q0600   LORazepam  0-4 mg Intravenous Q6H   Followed by   Derrill Memo ON 01/29/2021] LORazepam  0-4 mg Intravenous Q12H   melatonin  3 mg Oral QHS   multivitamin with minerals  1 tablet Oral Daily   thiamine  100 mg Oral Daily   Or   thiamine  100 mg Intravenous Daily   Continuous Infusions:  cefTRIAXone (ROCEPHIN)  IV Stopped (01/27/21 0705)     LOS: 1 day    Time spent:  over 30 min 40 min critical care time in setting of resp failure requiring intubation    Fayrene Helper, MD [facility]   To contact the attending provider between 7A-7P or the covering provider during after hours 7P-7A, please log into the web site [email or web address]  and access using universal [place] password for that web site. If you do not have the password, please call the hospital operator.  01/27/2021, 12:43 PM

## 2021-01-27 NOTE — Progress Notes (Signed)
Inpatient Rehab Admissions Coordinator:   Pt. Now intubated, CIR will sign off but can reconsult once Pt. Is extubated If therapies recommend CIR.  Clemens Catholic, [place], [place] Admissions Coordinator  [phone] (Person) [phone] (office)

## 2021-01-27 NOTE — Evaluation (Signed)
Physical Therapy Evaluation Patient Details Name: Rhonda Winters MRN: [id number] DOB: February 25, 1946 Today's Date: 01/27/2021  History of Present Illness  75 yo female admitted with AMS, PNA, sepsis, fall at home. 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 Hx of DM, RLS, chronic LBP  Clinical Impression  On eval, pt required Mod A +2 for mobility. She was able to walk ~25 feet with a RW. High fall risk and she is very impulsive. She also exhibits poor safety awareness, decreased awareness of deficits, and impaired problem solving. Pt is currently very confused and restless. Pt presents with general weakness, decreased activity tolerance, and impaired gait and balance. O2 dropped to 73% on RA during session on today. Reapplied [place] O2 once back in bed. No family present during session. At this time, PT recommendation is for CIR consult . Will plan to follow and progress activity as tolerated.         Recommendations for follow up therapy are one component of a multi-disciplinary discharge planning process, led by the attending physician.  Recommendations may be updated based on patient status, additional functional criteria and insurance authorization.  Follow Up Recommendations CIR    Assistance Recommended at Discharge Frequent or constant Supervision/Assistance  Functional Status Assessment Patient has had a recent decline in their functional status and demonstrates the ability to make significant improvements in function in a reasonable and predictable amount of time.  Equipment Recommendations  None recommended by PT    Recommendations for Other Services OT consult     Precautions / Restrictions Precautions Precautions: Fall Precaution Comments: incontinent; restless Restrictions Weight Bearing Restrictions: No      Mobility  Bed Mobility Overal bed mobility: Needs Assistance Bed Mobility: Supine to Sit;Sit to Supine     Supine to sit: Mod assist;+2 for safety/equipment Sit to supine: Min  assist;+2 for safety/equipment   General bed mobility comments: Impulsive! Assist for trunk and LEs. Multimodal cueing required.    Transfers Overall transfer level: Needs assistance Equipment used: Rolling walker (2 wheels);None Transfers: Sit to/from Stand;Bed to chair/wheelchair/BSC Sit to Stand: Min assist;+2 physical assistance;+2 safety/equipment   Step pivot transfers: Mod assist;+2 safety/equipment;From elevated surface       General transfer comment: Pt very unsafe hand placement, needs max multimodal cues for safety and sequencing, no line awareness, cognition severely impacting safety and function during transfers. Impulsive!    Ambulation/Gait Ambulation/Gait assistance: Mod assist;+2 physical assistance;+2 safety/equipment Gait Distance (Feet): 25 Feet Assistive device: Rolling walker (2 wheels) Gait Pattern/deviations: Step-through pattern;Decreased stride length       General Gait Details: Assist to stabilize pt and manage RW. Keeps RW too far ahead despite cues. Multimodal cueing required. Followed with recliner and used it to transport pt back to room. Pt refused to wear mask  Stairs            Wheelchair Mobility    Modified Rankin (Stroke Patients Only)       Balance Overall balance assessment: Needs assistance Sitting-balance support: Feet supported Sitting balance-Leahy Scale: Poor Sitting balance - Comments: min guard at best   Standing balance support: Reliant on assistive device for balance;Bilateral upper extremity supported Standing balance-Leahy Scale: Poor                               Pertinent Vitals/Pain Pain Assessment: Faces Faces Pain Scale: No hurt    Home Living Family/patient expects to be discharged to:: Private residence Living Arrangements: Spouse/significant  other Available Help at Discharge: Family Type of Home: House Home Access: Stairs to enter Entrance Stairs-Rails: Can reach both Entrance  Stairs-Number of Steps: 4   Home Layout: One level Home Equipment: Cane - single point;Shower seat;Grab bars - tub/shower;Grab bars - toilet;Rolling Walker (2 wheels) Additional Comments: husband works out of town, daughter can assist if needed in the evenings. Husband can take off first few days that pt is home ([place])    Prior Function Prior Level of Function : Patient poor historian/Family not available             Mobility Comments: last hospital stay (06/2020): using RW vs cane for ambulation. Also had L visual deficit present per notes       Hand Dominance   Dominant Hand: Right    Extremity/Trunk Assessment   Upper Extremity Assessment Upper Extremity Assessment: Defer to OT evaluation    Lower Extremity Assessment Lower Extremity Assessment: Generalized weakness    Cervical / Trunk Assessment Cervical / Trunk Assessment: Kyphotic  Communication   Communication: Expressive difficulties  Cognition Arousal/Alertness: Awake/alert Behavior During Therapy: Restless Overall Cognitive Status: Impaired/Different from baseline Area of Impairment: Orientation;Attention;Memory;Following commands;Problem solving;Awareness;Safety/judgement                 Orientation Level: Disoriented to;Place;Time;Situation Current Attention Level: Focused Memory: Decreased recall of precautions;Decreased short-term memory Following Commands: Follows one step commands with increased time Safety/Judgement: Decreased awareness of safety;Decreased awareness of deficits Awareness: Intellectual Problem Solving: Slow processing;Difficulty sequencing;Requires verbal cues;Requires tactile cues General Comments: Labile, easily distracted, redirected fairly easily        General Comments General comments (skin integrity, edema, etc.): Pt required 5L O2 via [place] to maintain SpO2 of >90%. On RA she dropped into the mid 70's.    Exercises     Assessment/Plan     PT Assessment Patient needs continued PT services  PT Problem List Decreased strength;Decreased mobility;Decreased activity tolerance;Decreased balance;Decreased knowledge of use of DME;Decreased cognition;Decreased safety awareness       PT Treatment Interventions DME instruction;Gait training;Therapeutic activities;Therapeutic exercise;Patient/family education;Functional mobility training;Stair training;Balance training    PT Goals (Current goals can be found in the Care Plan section)  Acute Rehab PT Goals Patient Stated Goal: pt unable to state PT Goal Formulation: Patient unable to participate in goal setting Time For Goal Achievement: 02/10/21 Potential to Achieve Goals: Good    Frequency Min 3X/week   Barriers to discharge        Co-evaluation PT/OT/SLP Co-Evaluation/Treatment: Yes Reason for Co-Treatment: Necessary to address cognition/behavior during functional activity;For patient/therapist safety;To address functional/ADL transfers PT goals addressed during session: Mobility/safety with mobility;Proper use of DME OT goals addressed during session: ADL's and self-care;Proper use of Adaptive equipment and DME;Other (comment) (Cognition)       AM-PAC PT "6 Clicks" Mobility  Outcome Measure Help needed turning from your back to your side while in a flat bed without using bedrails?: A Lot Help needed moving from lying on your back to sitting on the side of a flat bed without using bedrails?: A Lot Help needed moving to and from a bed to a chair (including a wheelchair)?: A Lot Help needed standing up from a chair using your arms (e.g., wheelchair or bedside chair)?: A Lot Help needed to walk in hospital room?: A Lot Help needed climbing 3-5 steps with a railing? : Total 6 Click Score: 11    End of Session Equipment Utilized During Treatment: Gait belt Activity Tolerance:  Patient tolerated treatment well Patient left: with call bell/phone within reach   PT Visit  Diagnosis: Muscle weakness (generalized) (M62.81);Difficulty in walking, not elsewhere classified (R26.2);History of falling (Z91.81);Unsteadiness on feet (R26.81)    Time: 7741-4239 PT Time Calculation (min) (ACUTE ONLY): 34 min   Charges:   PT Evaluation $PT Eval Moderate Complexity: 1 Mod             Doreatha Massed, PT Acute Rehabilitation  Office: [phone] Pager: [phone]

## 2021-01-27 NOTE — Procedures (Signed)
Lumbar Puncture Procedure Note  Rhonda Winters  [id number]  December 12, 1945  Date:01/27/21  Time:2:09 PM   Provider Performing:Tyannah Sane   Procedure: Lumbar Puncture (53005)  Indication(s) Rule out meningitis  Consent Risks of the procedure as well as the alternatives and risks of each were explained to the patient and/or caregiver.  Consent for the procedure was obtained and is signed in the bedside chart  Anesthesia Topical only with 1% lidocaine    Time Out Verified patient identification, verified procedure, site/side was marked, verified correct patient position, special equipment/implants available, medications/allergies/relevant history reviewed, required imaging and test results available.   Sterile Technique Maximal sterile technique including sterile barrier drape, hand hygiene, sterile gown, sterile gloves, mask, hair covering.    Procedure Description Using palpation, approximate location of L3-L4 space identified.   Lidocaine used to anesthetize skin and subcutaneous tissue overlying this area.  A 20g spinal needle was then used to access the subarachnoid space. Opening pressure:Not obtained. Closing pressure:Not obtained. 0 mL CSF obtained.  Complications/Tolerance None; patient tolerated the procedure well.   EBL Minimal   Specimen(s) none  Rhonda Hansen, MD Internal Medicine Resident PGY-3 [facility] 01/27/2021 2:10 PM

## 2021-01-27 NOTE — Evaluation (Signed)
SLP Cancellation Note  Patient Details Name: Rhonda Winters MRN: [id number] DOB: 28-Mar-1945   Cancelled treatment:       Reason Eval/Treat Not Completed: Other (comment);Medical issues which prohibited therapy (respiratory issues required transfer to ICU)   Rhonda Winters 01/27/2021, 1:39 PM  Rhonda Lime, Rhonda Winters [place] Office [phone] Pager [phone]

## 2021-01-27 NOTE — TOC Initial Note (Signed)
Transition of Care [facility]) - Initial/Assessment Note    Patient Details  Name: Rhonda Winters MRN: [id number] Date of Birth: [birth year]  Transition of [facility]) CM/SW Contact:    Dessa Phi, RN Phone Number: 01/27/2021, 1:54 PM  Clinical Narrative: Transferred to SDU. TOC continue to monitor.                  Expected Discharge Plan: [place] Barriers to Discharge: Continued Medical Work up   Patient Goals and CMS Choice Patient states their goals for this hospitalization and ongoing recovery are:: go home CMS Medicare.gov Compare Post Acute Care list provided to:: Patient Represenative (must comment) Choice offered to / list presented to : Spouse  Expected Discharge Plan and Services Expected Discharge Plan: [place]   Discharge Planning Services: CM Consult   Living arrangements for the past 2 months: Single Family Home                                      Prior Living Arrangements/Services Living arrangements for the past 2 months: Single Family Home Lives with:: Spouse Patient language and need for interpreter reviewed:: Yes Do you feel safe going back to the place where you live?: Yes      Need for Family Participation in Patient Care: No (Comment)     Criminal Activity/Legal Involvement Pertinent to Current Situation/Hospitalization: No - Comment as needed  Activities of Daily Living Home Assistive Devices/Equipment: Eyeglasses, Environmental consultant (specify type), CBG Meter, Dentures (specify type) (front wheeled walker, upper/lower dentures) ADL Screening (condition at time of admission) Patient's cognitive ability adequate to safely complete daily activities?: No (patient confused, talking out of her head) Is the patient deaf or have difficulty hearing?: No Does the patient have difficulty seeing, even when wearing glasses/contacts?: No Does the patient have difficulty concentrating, remembering, or making decisions?: Yes  (patient confused, talking out of her head) Patient able to express need for assistance with ADLs?: Yes Does the patient have difficulty dressing or bathing?: Yes Independently performs ADLs?: No Communication: Independent Dressing (OT): Needs assistance Is this a change from baseline?: Change from baseline, expected to last >3 days Grooming: Needs assistance Is this a change from baseline?: Change from baseline, expected to last >3 days Feeding: Needs assistance Is this a change from baseline?: Change from baseline, expected to last >3 days Bathing: Needs assistance Is this a change from baseline?: Change from baseline, expected to last >3 days Toileting: Needs assistance Is this a change from baseline?: Change from baseline, expected to last >3days In/Out Bed: Needs assistance Is this a change from baseline?: Change from baseline, expected to last >3 days Walks in Home: Needs assistance Is this a change from baseline?: Change from baseline, expected to last >3 days Does the patient have difficulty walking or climbing stairs?: Yes (secondary to left sided weakness) Weakness of Legs: Left (L>R) Weakness of Arms/Hands: Left  Permission Sought/Granted Permission sought to share information with : Case Manager Permission granted to share information with : Yes, Verbal Permission Granted  Share Information with NAME: Case manager           Emotional Assessment Appearance:: Appears stated age Attitude/Demeanor/Rapport: Gracious Affect (typically observed): Accepting   Alcohol / Substance Use: Alcohol Use Psych Involvement: No (comment)  Admission diagnosis:  Pain [R52] Fall [W19.XXXA] CAP (community acquired pneumonia) [J18.9] Sepsis ([place]) [A41.9] Community acquired  pneumonia of right upper lobe of lung [J18.9] Sepsis with acute renal failure without septic shock, due to unspecified organism, unspecified acute renal failure type ([place]) [A41.9, R65.20, N17.9] Patient Active  Problem List   Diagnosis Date Noted   CAP (community acquired pneumonia) 01/26/2021   Sepsis ([place]) 01/26/2021   Acute encephalopathy 01/26/2021   ARF (acute renal failure) ([place]) 01/26/2021   Cerebrovascular accident (CVA) due to occlusion of right posterior cerebral artery ([place]) 08/10/2020   CVA (cerebral vascular accident) ([place]) 06/30/2020   Stroke ([place]) 06/29/2020   Trigeminal neuralgia of right side of face 06/03/2020   Acute hypoxemic respiratory failure due to COVID-19 ([place]) 12/26/2019   HTN (hypertension) 12/26/2019   Diabetic neuropathy associated with type 2 diabetes mellitus ([place]) 12/26/2019   Chronic back pain 12/26/2019   T2DM (type 2 diabetes mellitus) ([place]) 12/26/2019   Hypokalemia 12/26/2019   Elevated LFTs 12/26/2019   Hypothyroidism 12/26/2019   DDD (degenerative disc disease), lumbar 05/17/2017   Hyperlipidemia with target LDL less than 130 06/12/2013   Depression 03/15/2011   GERD (gastroesophageal reflux disease) 03/15/2011   Restless legs 03/15/2011   PCP:  Curly Rim, MD Pharmacy:   [facility] [id number], [place] [address] Phone: [phone] Fax: [phone]  [facility] [place], [place] [address] Phone: [phone] Fax: [phone]     Social Determinants of Health (SDOH) Interventions    Readmission Risk Interventions No flowsheet data found.

## 2021-01-27 NOTE — Procedures (Signed)
Patient Name: KAREE FORGE  MRN: [id number]  Epilepsy Attending: Lora Havens  Referring Physician/Provider: Dr Ina Homes Date: 01/27/2021 Duration: 01/27/2021 1440 to 1730  Patient history: 75yo F with ams. EEG to evaluate for seizure.  Level of alertness:  lethargic   AEDs during EEG study: Ativan  Technical aspects: This EEG was obtained using a 10 lead EEG system positioned circumferentially without any parasagittal coverage (rapid EEG). Computer selected EEG is reviewed as  well as background features and all clinically significant events.   Description: EEG showed continuous generalized 3 to 6 Hz theta-delta slowing admixed with intermittent generalized 15-18hz  beta activity.Hyperventilation and photic stimulation were not performed.     ABNORMALITY - Continuous slow, generalized  IMPRESSION: This limited ceribell EEG is suggestive of moderate to severe diffuse encephalopathy, nonspecific etiology but likely related to sedation. No seizures or epileptiform discharges were seen throughout the recording.   If suspicion for ictal-interictal activity remains a concern, a conventional EEG can be considered.   Dr Tamala Julian was notified.  Amnah Breuer Barbra Sarks

## 2021-01-27 NOTE — Progress Notes (Signed)
Inpatient Rehab Admissions Coordinator:  ? ?Per therapy recommendations,  patient was screened for CIR candidacy by Liala Codispoti, MS, CCC-SLP. At this time, Pt. Appears to be a a potential candidate for CIR. I will place   order for rehab consult per protocol for full assessment. Please contact me any with questions. ? ?Alfonse Garringer, MS, CCC-SLP ?Rehab Admissions Coordinator  ?[phone] (celll) ?[phone] (office) ? ?

## 2021-01-27 NOTE — Procedures (Signed)
Intubation Procedure Note  Rhonda Winters  [id number]  Jul 10, 1945  Date:01/27/21  Time:2:07 PM   Provider Performing:Lavonte Palos    Procedure: Intubation (91660)  Indication(s) Respiratory Failure  Consent Unable to obtain consent due to emergent nature of procedure.   Anesthesia Etomidate, Versed, Fentanyl, and Rocuronium   Time Out Verified patient identification, verified procedure, site/side was marked, verified correct patient position, special equipment/implants available, medications/allergies/relevant history reviewed, required imaging and test results available.   Sterile Technique Usual hand hygeine, masks, and gloves were used   Procedure Description Patient positioned in bed supine.  Sedation given as noted above.  Patient was intubated with endotracheal tube using Glidescope.  View was Grade 1 full glottis .  Number of attempts was 1.  Colorimetric CO2 detector was consistent with tracheal placement.   Complications/Tolerance None; patient tolerated the procedure well. Chest X-ray is ordered to verify placement.   EBL Minimal   Specimen(s) None  [name], MD Internal Medicine Resident PGY-3 [facility] 01/27/2021 2:08 PM

## 2021-01-27 NOTE — Progress Notes (Signed)
Patient had a 2.01 sec and 1.64 sec pause with bradycardia. MD Tamala Julian made aware. Discontinue precedex per MD Tamala Julian. If need propofol gtt for sedation, order is in per MD Tamala Julian.

## 2021-01-27 NOTE — Procedures (Signed)
Central Venous Catheter Insertion Procedure Note  GUNHILD BAUTCH  [id number]  1945-06-28  Date:01/27/21  Time:2:08 PM   Provider Performing:Ramari Bray   Procedure: Insertion of Non-tunneled Central Venous Catheter(36556) with US guidance (48889)   Indication(s) Medication administration  Consent Unable to obtain consent due to emergent nature of procedure.  Anesthesia Topical only with 1% lidocaine   Timeout Verified patient identification, verified procedure, site/side was marked, verified correct patient position, special equipment/implants available, medications/allergies/relevant history reviewed, required imaging and test results available.  Sterile Technique Maximal sterile technique including full sterile barrier drape, hand hygiene, sterile gown, sterile gloves, mask, hair covering, sterile ultrasound probe cover (if used).  Procedure Description Area of catheter insertion was cleaned with chlorhexidine and draped in sterile fashion.  With real-time ultrasound guidance a central venous catheter was placed into the left internal jugular vein. Nonpulsatile blood flow and easy flushing noted in all ports.  The catheter was sutured in place and sterile dressing applied.  Complications/Tolerance None; patient tolerated the procedure well. Chest X-ray is ordered to verify placement for internal jugular or subclavian cannulation.   Chest x-ray is not ordered for femoral cannulation.  EBL Minimal  Specimen(s) None   [name], MD Internal Medicine Resident PGY-3 [facility] 01/27/2021 2:09 PM

## 2021-01-27 NOTE — Consult Note (Signed)
NAME:  Rhonda Winters, MRN:  [id number], DOB:  [birth year], LOS: 1 ADMISSION DATE:  01/25/2021, CONSULTATION DATE:  01/27/21 REFERRING MD:  Florene Glen, CHIEF COMPLAINT:  AMS   History of Present Illness:  75 year old woman w/ hx of metabolic syndrome presenting with increasing confusion.  Workup has revealed RUL Pneumonia and pneumococcal bacteremia.  Unfortunately became more agitated through day, given ativan but then became obtunded with increased WOB.  Initial dose flumazenil ineffective.  Transferred to ICU and PCCM consulted.  Hx per chart review.  Pertinent  Medical History  Hypothyroidism HTN Prior stroke T2DM  Significant Hospital Events: Including procedures, antibiotic start and stop dates in addition to other pertinent events   11/29 admitted 11/30 decompensation, intubation  Interim History / Subjective:  consulted  Objective   Blood pressure (!) 152/108, pulse 82, temperature 98 F (36.7 C), resp. rate (!) 22, height 5\' 2"  (1.575 m), weight 92.5 kg, SpO2 (!) 73 %.    Vent Mode: PRVC FiO2 (%):  [50 %] 50 % Set Rate:  [26 bmp] 26 bmp Vt Set:  [400 mL] 400 mL PEEP:  [5 cmH20] 5 cmH20   Intake/Output Summary (Last 24 hours) at 01/27/2021 1402 Last data filed at 01/27/2021 1043 Gross per 24 hour  Intake 950 ml  Output 1025 ml  Net -75 ml   Filed Weights   01/25/21 2027  Weight: 92.5 kg    Examination: General: ill appearing woman tachypneic HENT: pupils reactive, not tracking Lungs: rhonci worse on R, + accessory muscle use Cardiovascular: RRR, ext lukewarm, cap refill 2s Abdomen: soft, hypoactive BS Extremities: no edema, mild peripheral muscle wasting Neuro: withdraws x 4, not following commands, occasional purposeful movement Skin: some mottling  Resolved Hospital Problem list   N/a  Assessment & Plan:  Acute hypoxemic respiratory failure due to pneumococcal pneumonia- too encephalopathic for BIPAP and not the best modality for pneumonia.  Severe  sepsis secondary to pneumococcal pneumonia with bacteremic spread  Acute metabolic encephalopathy due to sepsis, question of meningitis, no obvious meningeal signs, CT head benign  Hypokalemia  Acute kidney injury improved  - Intubation, bronchoscopy - Vent support, VAP prevention bundle - Attempt LP but may be difficult w/ body habitus; add vanc and increase CTX dosing to cover for pneumococcal PNA - EEG, ceribell fine for now - Husband updated over phone  Best Practice (right click and "Reselect all SmartList Selections" daily)   Diet/type: NPO DVT prophylaxis: LMWH GI prophylaxis: PPI Lines: Central line Foley:  Yes, and it is still needed Code Status:  full code Last date of multidisciplinary goals of care discussion [pending]  Labs   CBC: Recent Labs  Lab 01/25/21 2048 01/26/21 0302  WBC 12.9* 11.1*  NEUTROABS 11.9* 10.3*  HGB 11.4* 10.8*  HCT 35.1* 32.5*  MCV 100.6* 101.6*  PLT 212 409    Basic Metabolic Panel: Recent Labs  Lab 01/25/21 2048 01/26/21 0302 01/26/21 1847 01/27/21 0450  NA 135 136 141 139  K 3.1* 3.4* 2.9* 2.9*  CL 96* 100 104 106  CO2 24 23 21* 21*  GLUCOSE 115* 116* 106* 114*  BUN 83* 79* 66* 53*  CREATININE 2.64* 2.20* 1.56* 1.10*  CALCIUM 9.6 8.9 9.5 9.7   GFR: Estimated Creatinine Clearance: 46.8 mL/min (A) (by C-G formula based on SCr of 1.1 mg/dL (H)). Recent Labs  Lab 01/25/21 2048 01/25/21 2250 01/26/21 0302  PROCALCITON  --   --  27.56  WBC 12.9*  --  11.1*  LATICACIDVEN 2.5* 1.3 1.7    Liver Function Tests: Recent Labs  Lab 01/25/21 2048 01/26/21 0302  AST 17 28  ALT 20 22  ALKPHOS 70 77  BILITOT 1.0 0.9  PROT 7.6 6.7  ALBUMIN 3.4* 3.0*   No results for input(s): LIPASE, AMYLASE in the last 168 hours. No results for input(s): AMMONIA in the last 168 hours.  ABG    Component Value Date/Time   PHART 7.296 (L) 01/27/2021 1233   PCO2ART 48.5 (H) 01/27/2021 1233   PO2ART 65.7 (L) 01/27/2021 1233   HCO3  23.0 01/27/2021 1233   TCO2 30 06/29/2020 1543   ACIDBASEDEF 3.1 (H) 01/27/2021 1233   O2SAT 88.1 01/27/2021 1233     Coagulation Profile: Recent Labs  Lab 01/25/21 2048  INR 1.1    Cardiac Enzymes: No results for input(s): CKTOTAL, CKMB, CKMBINDEX, TROPONINI in the last 168 hours.  HbA1C: Hgb A1c MFr Bld  Date/Time Value Ref Range Status  06/29/2020 07:32 PM 5.8 (H) 4.8 - 5.6 % Final    Comment:    (NOTE) Pre diabetes:          5.7%-6.4%  Diabetes:              >6.4%  Glycemic control for   <7.0% adults with diabetes   12/27/2019 03:56 AM 6.6 (H) 4.8 - 5.6 % Final    Comment:    (NOTE)         Prediabetes: 5.7 - 6.4         Diabetes: >6.4         Glycemic control for adults with diabetes: <7.0     CBG: Recent Labs  Lab 01/26/21 1954 01/26/21 2342 01/27/21 0358 01/27/21 0747 01/27/21 1120  GLUCAP 122* 127* 101* 109* 166*    Review of Systems:   Cannot assess: near comatose  Past Medical History:  She,  has a past medical history of Acquired hypothyroidism, Hypertension, Stroke ([place]), and Type 2 diabetes mellitus ([place]).   Surgical History:   Past Surgical History:  Procedure Laterality Date   LOOP RECORDER INSERTION N/A 07/01/2020   Procedure: LOOP RECORDER INSERTION;  Surgeon: Constance Haw, MD;  Location: [facility];  Service: Cardiovascular;  Laterality: N/A;     Social History:   reports that she quit smoking about 32 years ago. Her smoking use included cigarettes. She has never used smokeless tobacco. She reports current alcohol use. She reports that she does not use drugs.   Family History:  Her family history is not on file.   Allergies Allergies  Allergen Reactions   Hydrocodone-Acetaminophen Nausea And Vomiting     Home Medications  Prior to Admission medications   Medication Sig Start Date End Date Taking? Authorizing Provider  acetaminophen (TYLENOL) 500 MG tablet Take 1,000 mg by mouth every 6 (six) hours as needed  for moderate pain.   Yes [provider]  amitriptyline (ELAVIL) 25 MG tablet Take 25 mg by mouth at bedtime. 11/18/19  Yes [provider]  ascorbic acid (VITAMIN C) 500 MG tablet Take 1 tablet (500 mg total) by mouth daily. 12/31/19  Yes Pokhrel, Laxman, MD  aspirin 325 MG tablet Take 1 tablet (325 mg total) by mouth daily. 07/01/20  Yes Iona Beard, MD  atenolol (TENORMIN) 50 MG tablet Take 50 mg by mouth daily. 11/11/19  Yes [provider]  BIOTIN PO Take 1 tablet by mouth daily.   Yes [provider]  cetirizine (ZYRTEC) 10 MG  tablet Take 10 mg by mouth daily as needed for allergies.   Yes [provider]  clopidogrel (PLAVIX) 75 MG tablet Take 1 tablet (75 mg total) by mouth daily. 07/01/20  Yes Iona Beard, MD  diclofenac (VOLTAREN) 75 MG EC tablet Take 75 mg by mouth 2 (two) times daily. 11/18/19  Yes [provider]  DULoxetine (CYMBALTA) 60 MG capsule Take 60 mg by mouth daily. 11/18/19  Yes [provider]  gabapentin (NEURONTIN) 800 MG tablet Take 800 mg by mouth 3 (three) times daily. 11/18/19  Yes [provider]  hydrochlorothiazide (HYDRODIURIL) 50 MG tablet Take 50 mg by mouth daily. 11/18/19  Yes [provider]  levothyroxine (SYNTHROID) 125 MCG tablet Take 125 mcg by mouth daily before breakfast.   Yes [provider]  losartan (COZAAR) 50 MG tablet Take 1 tablet (50 mg total) by mouth daily. 07/01/20 01/26/22 Yes Iona Beard, MD  metFORMIN (GLUCOPHAGE-XR) 500 MG 24 hr tablet Take 500-1,000 mg by mouth See admin instructions. 11/18/19  Yes [provider]  vitamin E 180 MG (400 UNITS) capsule Take 400 Units by mouth daily.   Yes [provider]  atorvastatin (LIPITOR) 80 MG tablet Take 1 tablet (80 mg total) by mouth daily. Patient not taking: Reported on 01/26/2021 07/01/20   Iona Beard, MD  furosemide (LASIX) 20 MG tablet Take 20 mg by mouth daily as needed for fluid.   Patient not taking: Reported on 01/26/2021 08/21/19   [provider]     Critical care time: 45 minutes not including any separately billable procedures

## 2021-01-27 NOTE — Evaluation (Signed)
Occupational Therapy Evaluation Patient Details Name: Rhonda Winters MRN: [id number] DOB: 19-Nov-1945 Today's Date: 01/27/2021   History of Present Illness 75 yo female admitted with AMS, PNA, sepsis, fall at home. 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 Hx of DM, RLS, chronic LBP   Clinical Impression   Pt is currently poor historian and so unsure of what PLOF was as no family is present at this time. Evaluation is limted by poor cognition - Pt labile and impulsive throughout session with NO safety awareness requiring +2 for safety at all times. Pt with residual L field visual deficit from May 2022 and unable to follow commands for further assessment. Pt was total A for LB ADL, mod A +2 for transfers, max A +2 for peri care from [facility], max A for grooming. Pt will require skilled OT in the acute setting as well as afterwards at the CIR level to maximize safety and independence in ADL and functional transfers. Next session to focus on vision/cognition for functional grooming tasks.     Recommendations for follow up therapy are one component of a multi-disciplinary discharge planning process, led by the attending physician.  Recommendations may be updated based on patient status, additional functional criteria and insurance authorization.   Follow Up Recommendations  Acute inpatient rehab (3hours/day)    Assistance Recommended at Discharge Frequent or constant Supervision/Assistance  Functional Status Assessment  Patient has had a recent decline in their functional status and demonstrates the ability to make significant improvements in function in a reasonable and predictable amount of time.  Equipment Recommendations  None recommended by OT    Recommendations for Other Services PT consult     Precautions / Restrictions Precautions Precautions: Fall Precaution Comments: incontinent; restless Restrictions Weight Bearing Restrictions: No      Mobility Bed Mobility Overal bed mobility: Needs  Assistance Bed Mobility: Supine to Sit;Sit to Supine     Supine to sit: Mod assist;+2 for safety/equipment Sit to supine: Min assist;+2 for safety/equipment   General bed mobility comments: no line awareness, mod A for trunk elevation and use of bed pad to come EOB, Pt impulsive    Transfers Overall transfer level: Needs assistance Equipment used: Rolling walker (2 wheels) Transfers: Sit to/from Stand;Bed to chair/wheelchair/BSC Sit to Stand: Min assist;+2 safety/equipment;From elevated surface     Step pivot transfers: Mod assist;+2 safety/equipment;From elevated surface     General transfer comment: Pt very unsafe hand placement, needs max multimodal cues for safety and sequencing, no line awareness, cognition severly impacting safety and function during transfers      Balance Overall balance assessment: Needs assistance Sitting-balance support: Single extremity supported Sitting balance-Leahy Scale: Poor Sitting balance - Comments: min guard at best   Standing balance support: Bilateral upper extremity supported;Reliant on assistive device for balance Standing balance-Leahy Scale: Poor                             ADL either performed or assessed with clinical judgement   ADL Overall ADL's : Needs assistance/impaired Eating/Feeding: Moderate assistance   Grooming: Wash/dry face;Wash/dry hands;Moderate assistance;Cueing for sequencing;Cueing for safety;Sitting   Upper Body Bathing: Maximal assistance   Lower Body Bathing: Total assistance   Upper Body Dressing : Maximal assistance   Lower Body Dressing: Total assistance   Toilet Transfer: Moderate assistance;+2 for physical assistance;+2 for safety/equipment;Stand-pivot;BSC/3in1;Rolling walker (2 wheels) Toilet Transfer Details (indicate cue type and reason): multiple cues for safety Toileting- Clothing Manipulation and Hygiene:  Total assistance;Sit to/from stand;Bed level Toileting - Clothing  Manipulation Details (indicate cue type and reason): performed sit to stand from [facility], and also performed at bed level rolling     Functional mobility during ADLs: Moderate assistance;+2 for physical assistance;+2 for safety/equipment;Rolling walker (2 wheels) General ADL Comments: decreased attention, cognition, balance, hypoxia     Vision Baseline Vision/History:  (L visual Field cut from CVA in May) Patient Visual Report: Other (comment) (unable to follow commands for appropriate testing) Vision Assessment?: Vision impaired- to be further tested in functional context     Perception     Praxis      Pertinent Vitals/Pain Pain Assessment: Faces Faces Pain Scale: No hurt     Hand Dominance Right   Extremity/Trunk Assessment Upper Extremity Assessment Upper Extremity Assessment: Defer to OT evaluation   Lower Extremity Assessment Lower Extremity Assessment: Generalized weakness   Cervical / Trunk Assessment Cervical / Trunk Assessment: Kyphotic   Communication Communication Communication: Expressive difficulties   Cognition Arousal/Alertness: Awake/alert Behavior During Therapy: Restless Overall Cognitive Status: Impaired/Different from baseline Area of Impairment: Orientation;Attention;Memory;Following commands;Problem solving;Awareness;Safety/judgement                 Orientation Level: Disoriented to;Place;Time;Situation Current Attention Level: Focused Memory: Decreased recall of precautions;Decreased short-term memory Following Commands: Follows one step commands with increased time Safety/Judgement: Decreased awareness of safety;Decreased awareness of deficits Awareness: Intellectual Problem Solving: Slow processing;Difficulty sequencing;Requires verbal cues;Requires tactile cues General Comments: Labile, easily distracted, redirected fairly easily     General Comments  Pt required 5L O2 via [place] to maintain SpO2 of >90%. On RA she dropped into the mid  70's.    Exercises     Shoulder Instructions      Home Living Family/patient expects to be discharged to:: Private residence Living Arrangements: Spouse/significant other Available Help at Discharge: Family Type of Home: House Home Access: Stairs to enter Technical brewer of Steps: 4 Entrance Stairs-Rails: Can reach both [place]: One level     Bathroom Shower/Tub: Occupational psychologist: [place] - single point;Shower seat;Grab bars - tub/shower;Grab bars - toilet;Rolling Walker (2 wheels)   Additional Comments: husband works out of town, daughter can assist if needed in the evenings. Husband can take off first few days that pt is home ([place])      Prior Functioning/Environment Prior Level of Function : Patient poor historian/Family not available             Mobility Comments: last hospital stay (06/2020): using RW vs cane for ambulation. Also had L visual deficit present per notes          OT Problem List: Decreased strength;Decreased activity tolerance;Impaired balance (sitting and/or standing);Impaired vision/perception;Decreased cognition;Decreased safety awareness;Decreased knowledge of use of DME or AE;Obesity      OT Treatment/Interventions: Self-care/ADL training;DME and/or AE instruction;Therapeutic activities;Cognitive remediation/compensation;Visual/perceptual remediation/compensation;Patient/family education;Balance training    OT Goals(Current goals can be found in the care plan section) Acute Rehab OT Goals Patient Stated Goal: none stated Time For Goal Achievement: 02/10/21 Potential to Achieve Goals: Fair  OT Frequency: Min 2X/week   Barriers to D/C:            Co-evaluation PT/OT/SLP Co-Evaluation/Treatment: Yes Reason for Co-Treatment: Necessary to address cognition/behavior during functional activity;For patient/therapist safety;To address functional/ADL  transfers PT goals addressed during session: Mobility/safety with mobility;Proper use of DME OT goals addressed during session: ADL's and self-care;Proper use of Adaptive equipment  and DME;Other (comment) (Cognition)      AM-PAC OT "6 Clicks" Daily Activity     Outcome Measure Help from another person eating meals?: A Lot Help from another person taking care of personal grooming?: A Lot Help from another person toileting, which includes using toliet, bedpan, or urinal?: A Lot Help from another person bathing (including washing, rinsing, drying)?: A Lot Help from another person to put on and taking off regular upper body clothing?: A Lot Help from another person to put on and taking off regular lower body clothing?: Total 6 Click Score: 11   End of Session Equipment Utilized During Treatment: Gait belt;Rolling walker (2 wheels);Oxygen (5L) Nurse Communication: Mobility status;Precautions  Activity Tolerance: Treatment limited secondary to agitation Patient left: in bed;with call bell/phone within reach;with bed alarm set;with nursing/sitter in room  OT Visit Diagnosis: Unsteadiness on feet (R26.81);Other abnormalities of gait and mobility (R26.89);History of falling (Z91.81);Muscle weakness (generalized) (M62.81);Other symptoms and signs involving cognitive function                Time: 7322-0254 OT Time Calculation (min): 31 min Charges:  OT General Charges $OT Visit: 1 Visit OT Evaluation $OT Eval Moderate Complexity: [place] OTR/L Acute Rehabilitation Services Pager: [phone] Office: [place] 01/27/2021, 12:16 PM

## 2021-01-28 ENCOUNTER — Inpatient Hospital Stay (HOSPITAL_COMMUNITY): Payer: Medicare Other

## 2021-01-28 DIAGNOSIS — A419 Sepsis, unspecified organism: Secondary | ICD-10-CM | POA: Diagnosis not present

## 2021-01-28 DIAGNOSIS — G9340 Encephalopathy, unspecified: Secondary | ICD-10-CM | POA: Diagnosis not present

## 2021-01-28 LAB — GLUCOSE, CAPILLARY
Glucose-Capillary: 115 mg/dL — ABNORMAL HIGH (ref 70–99)
Glucose-Capillary: 133 mg/dL — ABNORMAL HIGH (ref 70–99)
Glucose-Capillary: 47 mg/dL — ABNORMAL LOW (ref 70–99)
Glucose-Capillary: 69 mg/dL — ABNORMAL LOW (ref 70–99)
Glucose-Capillary: 73 mg/dL (ref 70–99)
Glucose-Capillary: 75 mg/dL (ref 70–99)
Glucose-Capillary: 79 mg/dL (ref 70–99)
Glucose-Capillary: 83 mg/dL (ref 70–99)
Glucose-Capillary: 89 mg/dL (ref 70–99)
Glucose-Capillary: 93 mg/dL (ref 70–99)

## 2021-01-28 LAB — CBC
HCT: 30.2 % — ABNORMAL LOW (ref 36.0–46.0)
Hemoglobin: 9.9 g/dL — ABNORMAL LOW (ref 12.0–15.0)
MCH: 32.4 pg (ref 26.0–34.0)
MCHC: 32.8 g/dL (ref 30.0–36.0)
MCV: 98.7 fL (ref 80.0–100.0)
Platelets: 218 10*3/uL (ref 150–400)
RBC: 3.06 MIL/uL — ABNORMAL LOW (ref 3.87–5.11)
RDW: 14.4 % (ref 11.5–15.5)
WBC: 9.5 10*3/uL (ref 4.0–10.5)
nRBC: 0.4 % — ABNORMAL HIGH (ref 0.0–0.2)

## 2021-01-28 LAB — CULTURE, BLOOD (ROUTINE X 2): Special Requests: ADEQUATE

## 2021-01-28 LAB — COMPREHENSIVE METABOLIC PANEL
ALT: 28 U/L (ref 0–44)
AST: 23 U/L (ref 15–41)
Albumin: 2.8 g/dL — ABNORMAL LOW (ref 3.5–5.0)
Alkaline Phosphatase: 75 U/L (ref 38–126)
Anion gap: 11 (ref 5–15)
BUN: 46 mg/dL — ABNORMAL HIGH (ref 8–23)
CO2: 25 mmol/L (ref 22–32)
Calcium: 9.9 mg/dL (ref 8.9–10.3)
Chloride: 109 mmol/L (ref 98–111)
Creatinine, Ser: 0.96 mg/dL (ref 0.44–1.00)
GFR, Estimated: >60 mL/min (ref >60)
Glucose, Bld: 143 mg/dL — ABNORMAL HIGH (ref 70–99)
Potassium: 2.4 mmol/L — CL (ref 3.5–5.1)
Sodium: 145 mmol/L (ref 135–145)
Total Bilirubin: 0.8 mg/dL (ref 0.3–1.2)
Total Protein: 6.4 g/dL — ABNORMAL LOW (ref 6.5–8.1)

## 2021-01-28 LAB — TRIGLYCERIDES: Triglycerides: 170 mg/dL — ABNORMAL HIGH (ref <150)

## 2021-01-28 LAB — MAGNESIUM: Magnesium: 1.7 mg/dL (ref 1.7–2.4)

## 2021-01-28 LAB — BASIC METABOLIC PANEL
Anion gap: 6 (ref 5–15)
BUN: 38 mg/dL — ABNORMAL HIGH (ref 8–23)
CO2: 28 mmol/L (ref 22–32)
Calcium: 9.8 mg/dL (ref 8.9–10.3)
Chloride: 111 mmol/L (ref 98–111)
Creatinine, Ser: 0.8 mg/dL (ref 0.44–1.00)
GFR, Estimated: >60 mL/min (ref >60)
Glucose, Bld: 241 mg/dL — ABNORMAL HIGH (ref 70–99)
Potassium: 2.5 mmol/L — CL (ref 3.5–5.1)
Sodium: 145 mmol/L (ref 135–145)

## 2021-01-28 LAB — CSF CELL COUNT WITH DIFFERENTIAL
Other Cells, CSF: UNDETERMINED
RBC Count, CSF: 1720 /mm3 — ABNORMAL HIGH
Tube #: 4
WBC, CSF: 0 /mm3 (ref 0–5)

## 2021-01-28 LAB — PHOSPHORUS: Phosphorus: 2.3 mg/dL — ABNORMAL LOW (ref 2.5–4.6)

## 2021-01-28 MED ORDER — LORAZEPAM 2 MG/ML IJ SOLN: 2.0000 mg | INTRAMUSCULAR | Status: DC | PRN

## 2021-01-28 MED ORDER — DEXTROSE 50 % IV SOLN
INTRAVENOUS | Status: AC
  Administered 2021-01-28: 50 mL via INTRAVENOUS
  Filled 2021-01-28: qty 50

## 2021-01-28 MED ORDER — DEXTROSE 50 % IV SOLN
12.5000 g | INTRAVENOUS | Status: AC
  Administered 2021-01-28: 12.5 g via INTRAVENOUS

## 2021-01-28 MED ORDER — POTASSIUM CHLORIDE 10 MEQ/50ML IV SOLN
10.0000 meq | INTRAVENOUS | Status: AC
  Administered 2021-01-28 (×4): 10 meq via INTRAVENOUS
  Filled 2021-01-28 (×4): qty 50

## 2021-01-28 MED ORDER — CHLORHEXIDINE GLUCONATE 0.12% ORAL RINSE (MEDLINE KIT)
15.0000 mL | Freq: Two times a day (BID) | OROMUCOSAL | Status: DC
  Administered 2021-01-28 – 2021-02-12 (×31): 15 mL via OROMUCOSAL

## 2021-01-28 MED ORDER — LEVETIRACETAM IN NACL 500 MG/100ML IV SOLN
500.0000 mg | Freq: Two times a day (BID) | INTRAVENOUS | Status: DC
  Administered 2021-01-28 – 2021-02-01 (×8): 500 mg via INTRAVENOUS
  Filled 2021-01-28 (×9): qty 100

## 2021-01-28 MED ORDER — MIDAZOLAM HCL 2 MG/2ML IJ SOLN: 4.0000 mg | Freq: Once | INTRAMUSCULAR | Status: AC

## 2021-01-28 MED ORDER — DEXTROSE 50 % IV SOLN: 1.0000 | Freq: Once | INTRAVENOUS | Status: AC

## 2021-01-28 MED ORDER — EPINEPHRINE 1 MG/10ML IJ SOSY
PREFILLED_SYRINGE | INTRAMUSCULAR | Status: AC
  Filled 2021-01-28: qty 10

## 2021-01-28 MED ORDER — ORAL CARE MOUTH RINSE
15.0000 mL | OROMUCOSAL | Status: DC
  Administered 2021-01-28 – 2021-02-12 (×147): 15 mL via OROMUCOSAL

## 2021-01-28 MED ORDER — ENOXAPARIN SODIUM 40 MG/0.4ML IJ SOSY
40.0000 mg | PREFILLED_SYRINGE | INTRAMUSCULAR | Status: DC
  Administered 2021-01-29 – 2021-02-12 (×14): 40 mg via SUBCUTANEOUS
  Filled 2021-01-28 (×14): qty 0.4

## 2021-01-28 MED ORDER — VITAL HIGH PROTEIN PO LIQD
1000.0000 mL | ORAL | Status: DC
  Administered 2021-01-28 – 2021-02-08 (×12): 1000 mL

## 2021-01-28 MED ORDER — MIDAZOLAM HCL 2 MG/2ML IJ SOLN
INTRAMUSCULAR | Status: AC
  Administered 2021-01-28: 4 mg via INTRAVENOUS
  Filled 2021-01-28: qty 4

## 2021-01-28 MED ORDER — PROSOURCE TF PO LIQD
45.0000 mL | Freq: Two times a day (BID) | ORAL | Status: DC
  Administered 2021-01-28 – 2021-02-09 (×25): 45 mL
  Filled 2021-01-28 (×26): qty 45

## 2021-01-28 MED ORDER — GADOBUTROL 1 MMOL/ML IV SOLN
10.0000 mL | Freq: Once | INTRAVENOUS | Status: AC | PRN
  Administered 2021-01-28: 10 mL via INTRAVENOUS

## 2021-01-28 MED ORDER — ATROPINE SULFATE 1 MG/10ML IJ SOSY
PREFILLED_SYRINGE | INTRAMUSCULAR | Status: AC
  Filled 2021-01-28: qty 10

## 2021-01-28 MED ORDER — FENTANYL 2500MCG IN NS 250ML (10MCG/ML) PREMIX INFUSION
0.0000 ug/h | INTRAVENOUS | Status: DC
  Administered 2021-01-28: 25 ug/h via INTRAVENOUS
  Administered 2021-01-29: 100 ug/h via INTRAVENOUS
  Administered 2021-01-30 – 2021-02-01 (×3): 50 ug/h via INTRAVENOUS
  Administered 2021-02-02: 200 ug/h via INTRAVENOUS
  Administered 2021-02-02: 75 ug/h via INTRAVENOUS
  Filled 2021-01-28 (×7): qty 250

## 2021-01-28 MED ORDER — LEVETIRACETAM IN NACL 1000 MG/100ML IV SOLN
1000.0000 mg | INTRAVENOUS | Status: AC
  Administered 2021-01-28: 1000 mg via INTRAVENOUS
  Filled 2021-01-28: qty 100

## 2021-01-28 MED ORDER — MAGNESIUM SULFATE 4 GM/100ML IV SOLN
4.0000 g | Freq: Once | INTRAVENOUS | Status: AC
  Administered 2021-01-28: 4 g via INTRAVENOUS
  Filled 2021-01-28: qty 100

## 2021-01-28 MED ORDER — POTASSIUM CHLORIDE 20 MEQ PO PACK
60.0000 meq | PACK | Freq: Once | ORAL | Status: AC
  Administered 2021-01-28: 60 meq
  Filled 2021-01-28: qty 3

## 2021-01-28 MED ORDER — VITAL HIGH PROTEIN PO LIQD: 1000.0000 mL | ORAL | Status: DC

## 2021-01-28 MED ORDER — DEXTROSE 50 % IV SOLN
INTRAVENOUS | Status: AC
  Filled 2021-01-28: qty 50

## 2021-01-28 NOTE — Progress Notes (Signed)
Hypoglycemic event: 69  Hypoglycemia standing orders initiated, 15 minute recheck CBG 93.

## 2021-01-28 NOTE — Progress Notes (Signed)
Patient experienced tonic seizure lasting 15-30 second. E-link notified.

## 2021-01-28 NOTE — Progress Notes (Signed)
Initial Nutrition Assessment RD working remotely.  DOCUMENTATION CODES:   Obesity unspecified  INTERVENTION:  - will adjust TF regimen: continue Vital High Protein @ 40 ml/hr and increase to goal rate of 50 ml/hr in 12 hours and continue 45 ml Prosource TF BID. - this regimen will provide 1280 kcal, 133 grams carbohydrate, 127 grams protein, and 1003 ml free water.  - free water flush to be per CCM.    NUTRITION DIAGNOSIS:   Inadequate oral intake related to inability to eat as evidenced by NPO status.  GOAL:   Provide needs based on ASPEN/SCCM guidelines  MONITOR:   Vent status, TF tolerance, Labs, Weight trends  REASON FOR ASSESSMENT:   Ventilator, Consult Enteral/tube feeding initiation and management  ASSESSMENT:   75 y.o. female with medical history of stroke in 06/2020, DM, HTN, HLD, and hypothyroidism. She was taken to the ED after being found to be increasingly confused x3 days after experiencing a fall.  Patient was intubated yesterday at ~1245 and OGT placed ~1435 (gastric antrum).   Consult for TF entered today at 0804 and order for TF per protocol added at that time: Vital High Protein @ 40 ml/hr with 45 ml Prosource TF BID. This regimen provides 1040 kcal, 106 grams carbohydrate, 106 grams protein, and 802 ml free water.  She has not been seen by a [place] RD since 12/27/19.   Weight on 11/28 was 204 lb and prior to this, the most recently documented weight was also 204 lb at [place] on 08/10/20. She weighed 219 lb within [facility] on 12/27/19.   No edema at this time, per documentation in the edema section of flow sheet.   Per notes: - seizure-like activity today (12/1)--head CT benign; EEG and per Neurologist: moderate to severe diffuse encephalopathy thought to likely be related to sedation - severe sepsis 2/2 PNA with bacteremic spread - acute metabolic encephalopathy - AKI--improving   Patient is currently intubated on ventilator support MV:  10.1 L/min Temp (24hrs), Avg:99.3 F (37.4 C), Min:98.2 F (36.8 C), Max:99.7 F (37.6 C) Propofol: 2.78 ml/hr (73 kcal/24 hrs)  Labs reviewed; CBGs: 133 and 115 mg/dl, K: 2.4 mmol/l, BUN: 46 mg/dl, Phos: 2.3 mg/dl.  Medications reviewed; 100 mg colace BID, 1 mg folvite/day, sliding scale novolog, 125 mcg synthroid/day per OGT, 3 mg melatonin/day, 15 ml multivitamin with minerals/day, 40 mg IV protonix/day, 17 g miralax/day, 60 mEq Klor-Con x1 dose 12/1, 10 mEq IV KCl x4 runs 12/1, 40 mEq Klor-Con x2 doses 11/30, 100 mg thiamine/day.    NUTRITION - FOCUSED PHYSICAL EXAM:  RD working remotely.  Diet Order:   Diet Order             Diet NPO time specified  Diet effective now                   EDUCATION NEEDS:   No education needs have been identified at this time  Skin:  Skin Assessment: Reviewed RN Assessment  Last BM:  11/29  Height:   Ht Readings from Last 1 Encounters:  01/25/21 5\' 2"  (1.575 m)    Weight:   Wt Readings from Last 1 Encounters:  01/25/21 92.5 kg     Estimated Nutritional Needs:  Kcal:  3151-7616 kcal Protein:  >/= 115 grams Fluid:  >/= 1.8 L/day      Jarome Matin, MS, RD, LDN, CNSC Inpatient Clinical Dietitian RD pager # available in [facility]  After hours/weekend pager # available in [place]

## 2021-01-28 NOTE — Consult Note (Addendum)
[facility] TeleNeurology Consult Services  Stat Consult  Patient Name:   Rhonda Winters, Rhonda Winters Date of Birth:   1945/03/04 Identification Number:   MRN - [id number] Date of Service:   01/28/2021 06:03:46  Diagnosis:       R56.9 - Seizures  Impression episode of seizure like activity in the setting of PNA, bacteremia, and a large chronic L PCA stroke seen on CT head. Suspect that the infectious process along with the chronic cortical stroke lowered her seizure threshold. Because of the cortical stroke, the risk of recurrent is high and therefore, will begin an AED.  CT HEAD: As Per Radiologist CT Head Showed No Acute Hemorrhage or Acute Core Infarct  Advanced Imaging: Advanced Imaging Not Completed because:  history/exam not consistent with LVO but rather consistent with seizure   Our recommendations are outlined below.  Diagnostic Studies: MRI brain w/wo contrast Routine EEG  Laboratory Studies: magnesium, phosphorus  Medications: Keppra 1gm now- already ordered Keppra 500 mg BID, beginning tonight  Nursing Recommendations: Maintain Euglycemia and Euthermia Neuro checks q1-2 hrs during ICU stay if critical Once stable neuro checks q 4hrs  Seizure precautions: Seizure precautions including no driving for state mandated time frame were discussed with patient with clear understanding  DVT Prophylaxis: Choice of Primary Team  Disposition: Neurology will follow  Free Text: Ativan 2 mg prn seizure > 5 min  Free Text: Ceribell being placed now. If this shows status epilepticus, recommend beginning propofol or versed gtt and titrating up as tolerated. In addition, she will need STAT cEEG monitoring if Ceribell shows status.  Free Text: Attempted to call the primary team and was forwarded to their pager. Awaiting a call back.   [facility]: [facility] Notification Time: 01/28/2021 06:01:38 Stamp Time: 01/28/2021 06:03:46 Callback Response Time:  01/28/2021 06:05:03   ----------------------------------------------------------------------------------------------------  Chief Complaint: seizure  History of Present Illness: Patient is a 75 year old Female.  75 year old woman who presents with seizure-like activity. She was admitted on 01/26/2021 for increasing confusion for 3 days. She was found to have community-acquired PNA and bacteremia. She declined on 01/27/2021 after a dose of Ativan, with worsening acute hypoxic and hypercarbic respiratory failure and required intubation. She has been getting fentanyl and precedex but continues to be responsive to pain even with these medications. The seizure was witnessed by nursing staff. It occurred at 0500, and it was described as whole body shaking, with her eyes closed, which lasted for 15-30 seconds. The patient has no history of seizures but has a history of large R PCA stroke. In CT, she was moving all four extremities equally and required further sedation.    Past Medical History: Hypertension Diabetes Mellitus Hyperlipidemia  No Anticoagulant use   Antiplatelet use: Yes Plavix    Examination: BP(157/101), Pulse(112), Blood Glucose(133)   Coma Exam:  Ventilator:  Yes Sedated: Yes Paralyzed: No  Responds to Voice:  No  Responds to Sternal rub:  Yes Postures: No  Withdraws limb from painful stimulation:  Yes R Leg, L Leg moves both legs with painful stimuli in both UEs  Spontaneous limb movement:  Right arm: No Left arm: No Right leg: Yes Left leg: Yes  Pupils:  Sluggish  Cough/Gag Reflexes:  Present    Patient / Family was informed the Neurology Consult would occur via TeleHealth consult by way of interactive audio and video telecommunications and consented to receiving care in this manner.  Patient is being evaluated for possible acute neurologic impairment and high probability of  imminent or life - threatening deterioration.I spent total of 20 minutes  providing care to this patient, including time for face to face visit via telemedicine, review of medical records, imaging studies and discussion of findings with providers, the patient and / or family.   Dr Uvaldo Bristle   [facility] [phone]  Case [id number]

## 2021-01-28 NOTE — TOC Progression Note (Signed)
Transition of Care [facility]) - Progression Note    Patient Details  Name: Rhonda Winters MRN: [id number] Date of Birth: 1945/03/26  [facility] CM/SW Contact  Leeroy Cha, RN Phone Number: 01/28/2021, 8:56 AM  Clinical Narrative:    Etoh withdrawal with seizure type activity this am.  Started on iv keppra, iv sedated, iv abx. TOC PLAN OF CARE: Following for progression-as above Following for toc needs- married lives at home with husband, no toc needs at this time.  Plan is to return to home once stable.   Expected Discharge Plan: [place] Barriers to Discharge: Continued Medical Work up  Expected Discharge Plan and Services Expected Discharge Plan: [place]   Discharge Planning Services: CM Consult   Living arrangements for the past 2 months: Single Family Home                                       Social Determinants of Health (SDOH) Interventions    Readmission Risk Interventions No flowsheet data found.

## 2021-01-28 NOTE — Progress Notes (Signed)
   NAME:  Rhonda Winters, MRN:  [id number], DOB:  1946-01-31, LOS: 2 ADMISSION DATE:  01/25/2021, CONSULTATION DATE:  01/27/21 REFERRING MD:  Florene Glen, CHIEF COMPLAINT:  AMS   History of Present Illness:  75 year old woman w/ hx of metabolic syndrome presenting with increasing confusion.  Workup has revealed RUL Pneumonia and pneumococcal bacteremia.  Unfortunately became more agitated through day, given ativan but then became obtunded with increased WOB.  Initial dose flumazenil ineffective.  Transferred to ICU and PCCM consulted.  Hx per chart review.  Pertinent  Medical History  Hypothyroidism HTN Prior stroke T2DM  Significant Hospital Events: Including procedures, antibiotic start and stop dates in addition to other pertinent events   11/29 admitted 11/30 decompensation, intubation 12/1 seizure-like activity  Interim History / Subjective:  Seizure like movements x 30s this AM Ceribell hooked up again, tele-neuro consulted, and AEDs started.  Objective   Blood pressure (!) 157/101, pulse (!) 57, temperature 99.3 F (37.4 C), resp. rate (!) 26, height 5\' 2"  (1.575 m), weight 92.5 kg, SpO2 100 %. CVP:  [9 mmHg] 9 mmHg  Vent Mode: PRVC FiO2 (%):  [40 %-100 %] 40 % Set Rate:  [26 bmp] 26 bmp Vt Set:  [400 mL] 400 mL PEEP:  [5 cmH20] 5 cmH20 Plateau Pressure:  [19 cmH20-23 cmH20] 20 cmH20   Intake/Output Summary (Last 24 hours) at 01/28/2021 0756 Last data filed at 01/28/2021 9675 Gross per 24 hour  Intake 1560.48 ml  Output 1275 ml  Net 285.48 ml    Filed Weights   01/25/21 2027  Weight: 92.5 kg    Examination: Constitutional: ill woman on vent  Eyes: pupils small, reactive, equal Ears, nose, mouth, and throat: ETT in place, minimal secreitons Cardiovascular: RRR, ext warm Respiratory: scattered rhonci, passive on vent Gastrointestinal: soft, hypoactive BS Skin: No rashes, normal turgor Neurologic: withdraws x 4, not following commands Psychiatric: RASS -4 at  present  K/Mg low CBC stable Repeat head CT stable  Resolved Hospital Problem list   N/a  Assessment & Plan:  Acute hypoxemic respiratory failure due to pneumococcal pneumonia  Severe sepsis secondary to pneumococcal pneumonia with bacteremic spread  Acute metabolic encephalopathy due to sepsis, question of meningitis, no obvious meningeal signs, CT head benign  Seizure-like activity 12/1; head CT benign; ceribell 11/30 neg, repeat pending, AEDs ordered  Acute kidney injury improved, hypo-K, hypo-Mg  - Vent support/ VAP prevention bundle - If ceribell shows seizures will need to add prop and consider LTVEEG, AEDs started by neuro - Fluoro guided LP, appreciate IR help - Start TF - Continue vanc/CTX pending strep sensitivities - Recheck afternoon Mg/K  Best Practice (right click and "Reselect all SmartList Selections" daily)   Diet/type: TF DVT prophylaxis: LMWH to start tomorrow (LP today) GI prophylaxis: PPI Lines: Central line Foley:  Yes, and it is still needed Code Status:  full code Last date of multidisciplinary goals of care discussion [update husband daily, full scope]  Critical care time: 36 minutes not including any separately billable procedures

## 2021-01-28 NOTE — Procedures (Addendum)
Patient Name: PERRIS TRIPATHI  MRN: [id number]  Epilepsy Attending: Lora Havens  Referring Physician/Provider: Dr Elsie Lincoln Duration: 12/29/2020 0636 to 1131   Patient history: 75yo F with ams, witnessed tonic clonic seizure lasting for about 15 seconds. EEG to evaluate for seizure.   Level of alertness:  lethargic    AEDs during EEG study: LEV, propofol   Technical aspects: This EEG was obtained using a 10 lead EEG system positioned circumferentially without any parasagittal coverage (rapid EEG). Computer selected EEG is reviewed as  well as background features and all clinically significant events.    Description: EEG showed continuous generalized 3 to 6 Hz theta-delta slowing. Hyperventilation and photic stimulation were not performed.      ABNORMALITY - Continuous slow, generalized   IMPRESSION: This limited ceribell EEG is suggestive of moderate to severe diffuse encephalopathy, nonspecific etiology but likely related to sedation. No seizures or epileptiform discharges were seen throughout the recording.    If suspicion for ictal-interictal activity remains a concern, a conventional EEG can be considered.     Chick Cousins Barbra Sarks

## 2021-01-28 NOTE — Progress Notes (Signed)
Placed bite block at this time due to patient biting down on ETT.

## 2021-01-28 NOTE — Progress Notes (Addendum)
Hypoglycemic Event  CBG: 47  Treatment: D50 50 mL (25 gm)  Symptoms: None  Follow-up CBG: Time:1617 CBG Result: 83  Possible Reasons for Event: Inadequate meal intake  Comments/MD notified:TF started, MD aware    Akacia Boltz

## 2021-01-28 NOTE — Progress Notes (Signed)
Critical value: Potassium 2.4

## 2021-01-28 NOTE — Procedures (Signed)
Fluoro guided LP performed at L2-L3.  Opening pressure 7.5 cm H20.  Blood tinged CSF obtained, which partially cleared during collection of 9 mL.  No complications.  See full dictation in PACS for additional details.  Madie Reno, MD

## 2021-01-28 NOTE — Progress Notes (Addendum)
[facility] Progress Note Patient Name: Rhonda Winters DOB: 1945-09-05 MRN: [id number]   Date of Service  01/28/2021  HPI/Events of Note  Pt is intubated and currently off sedation.  Pt tolerates fentanyl boluses well.   On camera assessment, she is calm and sedate.    eICU Interventions  Start on precedex gtt and given fentanyl boluses prn.  Bilateral wrist restraints ordered.      Intervention Category Intermediate Interventions: Other:  Elsie Lincoln 01/28/2021, 12:38 AM  3:47 AM Notified of hypokalemia.   K 2.4, crea 0.96.  Plan> Replete K; 76meq via OGT, 17meq x 4 IV ordered.   5:02 AM Notified of witnessed tonic clonic seizure lasting for about 15 seconds.   Pt is opening her eyes but not able to follow simple commands. She is agitated and trying to get out of bed.   Plan> Give versed 4mg  IV now.  Start on fentanyl gtt.  Ativan prn if with recurrent seizures.  Get CT head. Rapid EEG.  6:10 AM Teleneurology consulted. Awaiting callback.

## 2021-01-29 DIAGNOSIS — J154 Pneumonia due to other streptococci: Secondary | ICD-10-CM | POA: Diagnosis not present

## 2021-01-29 DIAGNOSIS — N179 Acute kidney failure, unspecified: Secondary | ICD-10-CM

## 2021-01-29 DIAGNOSIS — R6520 Severe sepsis without septic shock: Secondary | ICD-10-CM | POA: Diagnosis not present

## 2021-01-29 DIAGNOSIS — A419 Sepsis, unspecified organism: Secondary | ICD-10-CM | POA: Diagnosis not present

## 2021-01-29 DIAGNOSIS — J189 Pneumonia, unspecified organism: Secondary | ICD-10-CM | POA: Diagnosis not present

## 2021-01-29 LAB — BASIC METABOLIC PANEL
Anion gap: 11 (ref 5–15)
Anion gap: 6 (ref 5–15)
Anion gap: 7 (ref 5–15)
BUN: 32 mg/dL — ABNORMAL HIGH (ref 8–23)
BUN: 33 mg/dL — ABNORMAL HIGH (ref 8–23)
BUN: 34 mg/dL — ABNORMAL HIGH (ref 8–23)
CO2: 26 mmol/L (ref 22–32)
CO2: 28 mmol/L (ref 22–32)
CO2: 29 mmol/L (ref 22–32)
Calcium: 10 mg/dL (ref 8.9–10.3)
Calcium: 10.1 mg/dL (ref 8.9–10.3)
Calcium: 10.1 mg/dL (ref 8.9–10.3)
Chloride: 109 mmol/L (ref 98–111)
Chloride: 113 mmol/L — ABNORMAL HIGH (ref 98–111)
Chloride: 114 mmol/L — ABNORMAL HIGH (ref 98–111)
Creatinine, Ser: 0.72 mg/dL (ref 0.44–1.00)
Creatinine, Ser: 0.73 mg/dL (ref 0.44–1.00)
Creatinine, Ser: 0.74 mg/dL (ref 0.44–1.00)
GFR, Estimated: >60 mL/min (ref >60)
GFR, Estimated: >60 mL/min (ref >60)
GFR, Estimated: >60 mL/min (ref >60)
Glucose, Bld: 184 mg/dL — ABNORMAL HIGH (ref 70–99)
Glucose, Bld: 185 mg/dL — ABNORMAL HIGH (ref 70–99)
Glucose, Bld: 186 mg/dL — ABNORMAL HIGH (ref 70–99)
Potassium: 2.9 mmol/L — ABNORMAL LOW (ref 3.5–5.1)
Potassium: 3.4 mmol/L — ABNORMAL LOW (ref 3.5–5.1)
Potassium: 3.5 mmol/L (ref 3.5–5.1)
Sodium: 146 mmol/L — ABNORMAL HIGH (ref 135–145)
Sodium: 148 mmol/L — ABNORMAL HIGH (ref 135–145)
Sodium: 149 mmol/L — ABNORMAL HIGH (ref 135–145)

## 2021-01-29 LAB — CBC
HCT: 29.7 % — ABNORMAL LOW (ref 36.0–46.0)
Hemoglobin: 9.3 g/dL — ABNORMAL LOW (ref 12.0–15.0)
MCH: 32.2 pg (ref 26.0–34.0)
MCHC: 31.3 g/dL (ref 30.0–36.0)
MCV: 102.8 fL — ABNORMAL HIGH (ref 80.0–100.0)
Platelets: 196 10*3/uL (ref 150–400)
RBC: 2.89 MIL/uL — ABNORMAL LOW (ref 3.87–5.11)
RDW: 14.8 % (ref 11.5–15.5)
WBC: 9.6 10*3/uL (ref 4.0–10.5)
nRBC: 1 % — ABNORMAL HIGH (ref 0.0–0.2)

## 2021-01-29 LAB — GLUCOSE, CAPILLARY
Glucose-Capillary: 52 mg/dL — ABNORMAL LOW (ref 70–99)
Glucose-Capillary: 90 mg/dL (ref 70–99)
Glucose-Capillary: 177 mg/dL — ABNORMAL HIGH (ref 70–99)
Glucose-Capillary: 187 mg/dL — ABNORMAL HIGH (ref 70–99)
Glucose-Capillary: 177 mg/dL — ABNORMAL HIGH (ref 70–99)
Glucose-Capillary: 179 mg/dL — ABNORMAL HIGH (ref 70–99)

## 2021-01-29 LAB — PHOSPHORUS
Phosphorus: 1.4 mg/dL — ABNORMAL LOW (ref 2.5–4.6)
Phosphorus: 4.9 mg/dL — ABNORMAL HIGH (ref 2.5–4.6)

## 2021-01-29 LAB — MAGNESIUM
Magnesium: 2.1 mg/dL (ref 1.7–2.4)
Magnesium: 1.5 mg/dL — ABNORMAL LOW (ref 1.7–2.4)

## 2021-01-29 MED ORDER — POTASSIUM CHLORIDE 20 MEQ PO PACK
40.0000 meq | PACK | Freq: Once | ORAL | Status: AC
  Administered 2021-01-29: 40 meq
  Filled 2021-01-29: qty 2

## 2021-01-29 MED ORDER — POTASSIUM CHLORIDE 20 MEQ PO PACK
20.0000 meq | PACK | Freq: Once | ORAL | Status: AC
  Administered 2021-01-29: 20 meq
  Filled 2021-01-29: qty 1

## 2021-01-29 MED ORDER — DEXTROSE 50 % IV SOLN
INTRAVENOUS | Status: AC
  Administered 2021-01-29: 50 mL
  Filled 2021-01-29: qty 50

## 2021-01-29 MED ORDER — DILTIAZEM HCL-DEXTROSE 125-5 MG/125ML-% IV SOLN (PREMIX)
5.0000 mg/h | INTRAVENOUS | Status: DC
  Administered 2021-01-29: 5 mg/h via INTRAVENOUS
  Filled 2021-01-29: qty 125

## 2021-01-29 MED ORDER — POTASSIUM PHOSPHATES 15 MMOLE/5ML IV SOLN
45.0000 mmol | Freq: Once | INTRAVENOUS | Status: AC
  Administered 2021-01-29: 45 mmol via INTRAVENOUS
  Filled 2021-01-29: qty 15

## 2021-01-29 MED ORDER — DEXTROSE 5 % IV SOLN: INTRAVENOUS | Status: DC

## 2021-01-29 MED ORDER — FUROSEMIDE 10 MG/ML IJ SOLN
20.0000 mg | Freq: Once | INTRAMUSCULAR | Status: AC
  Administered 2021-01-29: 20 mg via INTRAVENOUS
  Filled 2021-01-29: qty 2

## 2021-01-29 MED ORDER — DEXTROSE 50 % IV SOLN
25.0000 g | Freq: Once | INTRAVENOUS | Status: AC
  Administered 2021-01-29: 25 g via INTRAVENOUS

## 2021-01-29 MED ORDER — FUROSEMIDE 10 MG/ML IJ SOLN
40.0000 mg | Freq: Once | INTRAMUSCULAR | Status: AC
  Administered 2021-01-29: 40 mg via INTRAVENOUS
  Filled 2021-01-29: qty 4

## 2021-01-29 MED ORDER — POTASSIUM CHLORIDE 10 MEQ/50ML IV SOLN
10.0000 meq | INTRAVENOUS | Status: AC
  Administered 2021-01-29 – 2021-01-30 (×4): 10 meq via INTRAVENOUS
  Filled 2021-01-29 (×4): qty 50

## 2021-01-29 NOTE — Progress Notes (Signed)
Hypoglycemia event: 52, standing orders initiated, e-link notified. Recheck glucose 90.

## 2021-01-29 NOTE — Progress Notes (Signed)
[facility] ADULT ICU REPLACEMENT PROTOCOL   The patient does apply for the [facility] Adult ICU Electrolyte Replacment Protocol based on the criteria listed below:   1.Exclusion criteria: TCTS patients, ECMO patients, and Dialysis patients 2. Is GFR >/= 30 ml/min? Yes.    Patient's GFR today is >60 3. Is SCr </= 2? Yes.   Patient's SCr is 0.72 mg/dL 4. Did SCr increase >/= 0.5 in 24 hours? No. 5.Pt's weight >40kg  Yes.   6. Abnormal electrolyte(s): K 2.9  7. Electrolytes replaced per protocol 8.  Call MD STAT for K+ </= 2.5, Phos </= 1, or Mag </= 1 Physician:    Billy Fischer 01/29/2021 8:52 PM

## 2021-01-29 NOTE — Progress Notes (Signed)
SLP Cancellation Note  Patient Details Name: Rhonda Winters MRN: [id number] DOB: 1946/02/05   Cancelled treatment:    Pt remains intubated. Our service will sign off and await new orders if/when warranted. Thank you.  Rhyse Loux L. Tivis Ringer, [place] CCC/SLP Acute Rehabilitation Services Office number [phone] Pager [phone]        Juan Quam Laurice 01/29/2021, 1:45 PM

## 2021-01-29 NOTE — Progress Notes (Signed)
eLink Physician-Brief Progress Note Patient Name: Rhonda Winters DOB: Nov 14, 1945 MRN: [id number]   Date of Service  01/29/2021  HPI/Events of Note  Heart rate controlled at 93 bpm.  eICU Interventions  Continue Cardizem gtt.        Kerry Kass Zaidin Blyden 01/29/2021, 11:13 PM

## 2021-01-29 NOTE — Progress Notes (Signed)
Family (husband and daughter) updated at bedside on plan of care.      Noe Gens, MSN, APRN, NP-C, AGACNP-BC [facility] 01/29/2021, 12:15 PM   Please see [email or web address] for pager details.   From 7A-7P if no response, please call [phone] After hours, please call [facility]Link [phone]

## 2021-01-29 NOTE — Progress Notes (Signed)
[facility] ADULT ICU REPLACEMENT PROTOCOL   The patient does apply for the [facility] Adult ICU Electrolyte Replacment Protocol based on the criteria listed below:   1.Exclusion criteria: TCTS patients, ECMO patients, and Dialysis patients 2. Is GFR >/= 30 ml/min? Yes.    Patient's GFR today is >60 3. Is SCr </= 2? Yes.   Patient's SCr is 0.73 mg/dL 4. Did SCr increase >/= 0.5 in 24 hours? No. 5.Pt's weight >40kg  Yes.   6. Abnormal electrolyte(s): Phos 1.4, mag 3.4  7. Electrolytes replaced per protocol 8.  Call MD STAT for K+ </= 2.5, Phos </= 1, or Mag </= 1 Physician:    Ronda Fairly A 01/29/2021 6:12 AM

## 2021-01-29 NOTE — Progress Notes (Addendum)
NAME:  Rhonda Winters, MRN:  [id number], DOB:  December 28, 1945, LOS: 3 ADMISSION DATE:  01/25/2021, CONSULTATION DATE:  01/27/21 REFERRING MD:  Florene Glen, CHIEF COMPLAINT:  AMS   History of Present Illness:  75 year old woman w/ hx of metabolic syndrome presenting with increasing confusion.  Workup has revealed RUL Pneumonia and pneumococcal bacteremia.  Unfortunately became more agitated through day, given ativan but then became obtunded with increased WOB.  Initial dose flumazenil ineffective.  Transferred to ICU and PCCM consulted.     Pertinent  Medical History  Hypothyroidism HTN Prior stroke T2DM  Significant Hospital Events: Including procedures, antibiotic start and stop dates in addition to other pertinent events   11/29 admitted 11/30 decompensation, intubation.  Attempted LP. 12/1 seizure-like activity, Ceribell in progress.  AED's initiated for seizure like movement.  IR LP. CT Head with no acute intracranial abnormalities, chronic small vessel disease, chronic R PCA territory infarct  Interim History / Subjective:  Afebrile  RN reports no seizure activity overnight.  Propofol turned off this am, remains on 50 mcg fentanyl. D5 turned off with hyperglycemia  Pt waking, following simple commands intermittently, purposeful movement  Blood cultures with pan sensitive strep pneumo Vent - 40-50% FiO2, PEEP 5  Glucose range 177-185  Objective   Blood pressure (!) 116/50, pulse 63, temperature 98.6 F (37 C), temperature source Axillary, resp. rate (!) 26, height 5\' 2"  (1.575 m), weight 90.1 kg, SpO2 92 %.    Vent Mode: PRVC FiO2 (%):  [40 %-100 %] 40 % Set Rate:  [26 bmp] 26 bmp Vt Set:  [400 mL] 400 mL PEEP:  [5 cmH20] 5 cmH20 Plateau Pressure:  [19 cmH20-23 cmH20] 20 cmH20   Intake/Output Summary (Last 24 hours) at 01/29/2021 0720 Last data filed at 01/29/2021 0602 Gross per 24 hour  Intake 1639.77 ml  Output 389 ml  Net 1250.77 ml   Filed Weights   01/25/21 2027  01/29/21 0410  Weight: 92.5 kg 90.1 kg    Examination: General:  critically ill appearing adult female lying in bed on vent in NAD HEENT: MM pink/moist, ETT, anicteric, left pupil irregular 60mm, right pupil 58mm / reactive Neuro: sedate, opens eyes to voice, wiggles toes to command, moves all extremities spontaneously CV: s1s2 RRR, no m/r/g PULM: non-labored at rest on vent, lungs bilaterally clear, diminished bases  GI: soft, bsx4 active, gastric tube in place, tolerating TF Extremities: warm/dry, SCD's in place, no edema Skin: no rashes or lesions  Resolved Hospital Problem list      Assessment & Plan:   Acute Hypoxemic Respiratory Failure secondary to Pneumococcal PNA (POA) -PRVC 8cc/kg  -daily SBT / WUA  -wean PEEP / FiO2 for sats >90% -follow intermittent CXR  -VAP prevention measures   Severe Sepsis secondary to Pneumococcal PNA with Bacteremia (POA) -continue ceftriaxone  -follow up CSF cultures  -? if she will need TEE, add repeat blood cultures to ensure clearance   Acute Metabolic Encephalopathy secondary to Sepsis (POA) Rule Out Meningitis  CT head negative, no meningeal signs on exam.  S/p LP on 12/1 per IR -follow CSF -continue propofol, fentanyl for sedation  -RASS goal 0 to -1, minimize sedation as able   Seizure Like Activity  Noted on 11/30, Ceribell negative 11/30.  Repeated on 12/1 negative as well. -continue anti-epileptics, keppra.  ? Duration of AED's -monitor for further seizure like activity  -PRN ativan for seizures   -propofol as above   AKI (POA) Hypokalemia, Hypophosphatemia, Hypomagnesemia  -  Trend BMP / urinary output -Replace electrolytes as indicated, KPhos 12/2 -Avoid nephrotoxic agents, ensure adequate renal perfusion  Anemia  -trend CBC -transfuse for Hgb <7%, or concern for active bleeding  DM II with Hyperglycemia  -sensitive SSI  -stop D5w   At Risk Malnutrition  -continue TF  -appreciate Nutrition  -continue folate,  thiamine    Best Practice (right click and "Reselect all SmartList Selections" daily)  Diet/type: TF DVT prophylaxis: LMWH  GI prophylaxis: PPI Lines: Central line Foley:  Yes, and it is still needed Code Status:  full code Last date of multidisciplinary goals of care discussion: 12/1   Critical care time: 44 minutes      Noe Gens, MSN, APRN, NP-C, AGACNP-BC [facility] 01/29/2021, 7:21 AM   Please see [email or web address] for pager details.   From 7A-7P if no response, please call [phone] After hours, please call ELink [phone]

## 2021-01-29 NOTE — Progress Notes (Signed)
Patient went into SVT after dose of hydralazine at 1838. Cardizem drip started. Converted to NSR at 2015.

## 2021-01-30 ENCOUNTER — Inpatient Hospital Stay (HOSPITAL_COMMUNITY): Payer: Medicare Other

## 2021-01-30 DIAGNOSIS — R0603 Acute respiratory distress: Secondary | ICD-10-CM

## 2021-01-30 DIAGNOSIS — J9601 Acute respiratory failure with hypoxia: Secondary | ICD-10-CM

## 2021-01-30 DIAGNOSIS — G9340 Encephalopathy, unspecified: Secondary | ICD-10-CM | POA: Diagnosis not present

## 2021-01-30 LAB — BRAIN NATRIURETIC PEPTIDE: B Natriuretic Peptide: 177.2 pg/mL — ABNORMAL HIGH (ref 0.0–100.0)

## 2021-01-30 LAB — CULTURE, RESPIRATORY W GRAM STAIN
Culture: NO GROWTH
Gram Stain: NONE SEEN

## 2021-01-30 LAB — CBC
HCT: 24.3 % — ABNORMAL LOW (ref 36.0–46.0)
Hemoglobin: 7.4 g/dL — ABNORMAL LOW (ref 12.0–15.0)
MCH: 32.2 pg (ref 26.0–34.0)
MCHC: 30.5 g/dL (ref 30.0–36.0)
MCV: 105.7 fL — ABNORMAL HIGH (ref 80.0–100.0)
Platelets: 169 10*3/uL (ref 150–400)
RBC: 2.3 MIL/uL — ABNORMAL LOW (ref 3.87–5.11)
RDW: 15.1 % (ref 11.5–15.5)
WBC: 10.7 10*3/uL — ABNORMAL HIGH (ref 4.0–10.5)
nRBC: 0.9 % — ABNORMAL HIGH (ref 0.0–0.2)

## 2021-01-30 LAB — GLUCOSE, CAPILLARY
Glucose-Capillary: 110 mg/dL — ABNORMAL HIGH (ref 70–99)
Glucose-Capillary: 130 mg/dL — ABNORMAL HIGH (ref 70–99)
Glucose-Capillary: 133 mg/dL — ABNORMAL HIGH (ref 70–99)
Glucose-Capillary: 143 mg/dL — ABNORMAL HIGH (ref 70–99)
Glucose-Capillary: 172 mg/dL — ABNORMAL HIGH (ref 70–99)
Glucose-Capillary: 206 mg/dL — ABNORMAL HIGH (ref 70–99)
Glucose-Capillary: 66 mg/dL — ABNORMAL LOW (ref 70–99)
Glucose-Capillary: 91 mg/dL (ref 70–99)

## 2021-01-30 LAB — MAGNESIUM
Magnesium: 1.3 mg/dL — ABNORMAL LOW (ref 1.7–2.4)
Magnesium: 2.3 mg/dL (ref 1.7–2.4)

## 2021-01-30 LAB — BASIC METABOLIC PANEL
Anion gap: 6 (ref 5–15)
BUN: 31 mg/dL — ABNORMAL HIGH (ref 8–23)
CO2: 27 mmol/L (ref 22–32)
Calcium: 8 mg/dL — ABNORMAL LOW (ref 8.9–10.3)
Chloride: 116 mmol/L — ABNORMAL HIGH (ref 98–111)
Creatinine, Ser: 0.61 mg/dL (ref 0.44–1.00)
GFR, Estimated: >60 mL/min (ref >60)
Glucose, Bld: 186 mg/dL — ABNORMAL HIGH (ref 70–99)
Potassium: 3.2 mmol/L — ABNORMAL LOW (ref 3.5–5.1)
Sodium: 149 mmol/L — ABNORMAL HIGH (ref 135–145)

## 2021-01-30 LAB — ECHOCARDIOGRAM COMPLETE
AR max vel: 1.51 cm2
AV Area VTI: 1.7 cm2
AV Area mean vel: 1.55 cm2
AV Mean grad: 11 mmHg
AV Peak grad: 26.8 mmHg
Ao pk vel: 2.59 m/s
Area-P 1/2: 2.66 cm2
Height: 62 in
MV M vel: 1.57 m/s
MV Peak grad: 9.9 mmHg
S' Lateral: 2.8 cm
Weight: 3206.37 oz

## 2021-01-30 LAB — PHOSPHORUS: Phosphorus: 3 mg/dL (ref 2.5–4.6)

## 2021-01-30 MED ORDER — MAGNESIUM SULFATE 4 GM/100ML IV SOLN
4.0000 g | Freq: Once | INTRAVENOUS | Status: AC
  Administered 2021-01-30: 4 g via INTRAVENOUS
  Filled 2021-01-30: qty 100

## 2021-01-30 MED ORDER — HALOPERIDOL LACTATE 5 MG/ML IJ SOLN
5.0000 mg | Freq: Four times a day (QID) | INTRAMUSCULAR | Status: DC
  Administered 2021-01-30 – 2021-02-02 (×10): 5 mg via INTRAVENOUS
  Filled 2021-01-30 (×11): qty 1

## 2021-01-30 MED ORDER — POTASSIUM CHLORIDE 10 MEQ/50ML IV SOLN
10.0000 meq | INTRAVENOUS | Status: AC
  Administered 2021-01-30 (×4): 10 meq via INTRAVENOUS
  Filled 2021-01-30 (×3): qty 50

## 2021-01-30 MED ORDER — DEXTROSE 50 % IV SOLN
INTRAVENOUS | Status: AC
  Filled 2021-01-30: qty 50

## 2021-01-30 MED ORDER — MAGNESIUM SULFATE 2 GM/50ML IV SOLN
2.0000 g | Freq: Once | INTRAVENOUS | Status: AC
  Administered 2021-01-30: 2 g via INTRAVENOUS
  Filled 2021-01-30: qty 50

## 2021-01-30 MED ORDER — LACTATED RINGERS IV BOLUS
500.0000 mL | Freq: Once | INTRAVENOUS | Status: AC
  Administered 2021-01-30: 500 mL via INTRAVENOUS

## 2021-01-30 MED ORDER — POTASSIUM CHLORIDE 20 MEQ PO PACK
20.0000 meq | PACK | ORAL | Status: AC
  Administered 2021-01-30 (×2): 20 meq
  Filled 2021-01-30 (×2): qty 1

## 2021-01-30 NOTE — Progress Notes (Signed)
[facility] Progress Note Patient Name: [name] DOB: [birth year] MRN: [id number]   Date of Service  01/30/2021  HPI/Events of Note  Concern regarding possible volume depletion.  eICU Interventions  Bedside RN to transduce CVP, stat BNP ordered, plan is to wean Cardizem gtt off as tolerated.        Kerry Kass Tomer Chalmers 01/30/2021, 12:18 AM

## 2021-01-30 NOTE — Progress Notes (Signed)
[facility] Progress Note Patient Name: Rhonda Winters DOB: 12-23-1945 MRN: [id number]   Date of Service  01/30/2021  HPI/Events of Note  BNP 177, electrolytes (including BUN / Creatinine ratio ) suggestive of volume depletion, accuracy of CVP questionable. Patient's blood pressures have been soft.  eICU Interventions  LR 500 cc iv bolus ordered to be infused over two hours.        Kerry Kass [place] 01/30/2021, 2:48 AM

## 2021-01-30 NOTE — Progress Notes (Signed)
[facility] ADULT ICU REPLACEMENT PROTOCOL   The patient does apply for the [facility] Adult ICU Electrolyte Replacment Protocol based on the criteria listed below:   1.Exclusion criteria: TCTS patients, ECMO patients, and Dialysis patients 2. Is GFR >/= 30 ml/min? Yes.    Patient's GFR today is >60 3. Is SCr </= 2? Yes.   Patient's SCr is 0.61 mg/dL 4. Did SCr increase >/= 0.5 in 24 hours? No. 5.Pt's weight >40kg  Yes.   6. Abnormal electrolyte(s): k 3.2, mag 1.3  7. Electrolytes replaced per protocol 8.  Call MD STAT for K+ </= 2.5, Phos </= 1, or Mag </= 1 Physician:    Ronda Fairly A 01/30/2021 6:44 AM

## 2021-01-30 NOTE — Progress Notes (Signed)
NAME:  Rhonda Winters, MRN:  [id number], DOB:  September 07, 1945, LOS: 4 ADMISSION DATE:  01/25/2021, CONSULTATION DATE:  01/27/21 REFERRING MD:  Florene Glen, CHIEF COMPLAINT:  AMS   History of Present Illness:  75 year old woman w/ hx of metabolic syndrome presenting with increasing confusion.  Workup has revealed RUL Pneumonia and pneumococcal bacteremia.  Unfortunately became more agitated through day, given ativan but then became obtunded with increased WOB.  Initial dose flumazenil ineffective.  Transferred to ICU and PCCM consulted.     Pertinent  Medical History  Hypothyroidism HTN Prior stroke T2DM  Significant Hospital Events: Including procedures, antibiotic start and stop dates in addition to other pertinent events   11/29 admitted 11/30 decompensation, intubation.  Attempted LP. 12/1 seizure-like activity, Ceribell in progress.  AED's initiated for seizure like movement.  IR LP. CT Head with no acute intracranial abnormalities, chronic small vessel disease, chronic R PCA territory infarct 12.2 - Afebrile . RN reports no seizure activity overnight.  Propofol turned off this am, remains on 50 mcg fentanyl. D5 turned off with hyperglycemia/ Pt waking, following simple commands intermittently, purposeful movement . Blood cultures with pan sensitive strep pneumo. Vent - 40-50% FiO2, PEEP 5 Glucose range 177-185  Interim History / Subjective:   12/3 - given lasix yesterday -> then soft BP -> needed fluids. Had SVT ->on cardizem and resolved. On 50% fio2. On fent gtt/diprivan gtt (prior intolreance to precedex) -> agitated encephalopathy +. Afebrile  Objective   Blood pressure (!) 119/47, pulse 93, temperature 98.3 F (36.8 C), temperature source Oral, resp. rate (!) 21, height 5\' 2"  (1.575 m), weight 90.9 kg, SpO2 94 %. CVP:  [7 mmHg-32 mmHg] 13 mmHg  Vent Mode: PSV;CPAP FiO2 (%):  [40 %-70 %] 50 % Set Rate:  [26 bmp] 26 bmp Vt Set:  [400 mL] 400 mL PEEP:  [5 cmH20] 5 cmH20 Pressure  Support:  [5 cmH20] 5 cmH20 Plateau Pressure:  [15 cmH20-20 cmH20] 15 cmH20   Intake/Output Summary (Last 24 hours) at 01/30/2021 1011 Last data filed at 01/30/2021 0416 Gross per 24 hour  Intake 3036.41 ml  Output 2400 ml  Net 636.41 ml   Filed Weights   01/25/21 2027 01/29/21 0410 01/30/21 0417  Weight: 92.5 kg 90.1 kg 90.9 kg    Examination: General Appearance:  Looks criticall ill OBESE - + Head:  Normocephalic, without obvious abnormality, atraumatic Eyes:  PERRL - yes, conjunctiva/corneas - mudd     Ears:  Normal external ear canals, both ears Nose:  G tube - no Throat:  ETT TUBE - yes , OG tube - yes Neck:  Supple,  No enlargement/tenderness/nodules Lungs: Clear to auscultation bilaterally, Ventilator   Synchrony - intermittent, 50% Heart:  S1 and S2 normal, no murmur, CVP - no.  Pressors - no Abdomen:  Soft, no masses, no organomegaly Genitalia / Rectal:  Not done Extremities:  Extremities- intact Skin:  ntact in exposed areas . Sacral area - not examined Neurologic:  Sedation - fent gtt, diprivan gtt -> RASS - +2  . Moves all 4s - yes. CAM-ICU - POSITIVE . Orientation - not orreitend     Resolved Hospital Problem list    AKI - resolved  Assessment & Plan:   Acute Hypoxemic Respiratory Failure secondary to Pneumococcal PNA (POA) due to Pneumooccal pneumonia  01/30/2021 - > does not meet criteria for SBT/Extubation in setting of Acute Respiratory Failure due to hypoxemia 50% and acute encephalopathy  plan -PRVC 8cc/kg  -daily  SBT / WUA  -wean PEEP / FiO2 for sats >90% -follow intermittent CXR  -VAP prevention measures   Severe Sepsis secondary to Pneumococcal PNA with Bacteremia (POA)  01/30/2021 - afebrile  Plan -  Anti-infectives (From admission, onward)    Start     Dose/Rate Route Frequency Ordered Stop   01/28/21 0400  vancomycin (VANCOREADY) IVPB 750 mg/150 mL  Status:  Discontinued        750 mg 150 mL/hr over 60 Minutes Intravenous Every 12  hours 01/27/21 1444 01/28/21 1146   01/27/21 1430  vancomycin (VANCOREADY) IVPB 1750 mg/350 mL        1,750 mg 175 mL/hr over 120 Minutes Intravenous  Once 01/27/21 1321 01/27/21 1649   01/27/21 1400  cefTRIAXone (ROCEPHIN) 2 g in sodium chloride 0.9 % 100 mL IVPB        2 g 200 mL/hr over 30 Minutes Intravenous Every 12 hours 01/27/21 1309     01/25/21 2325  sodium chloride 0.9 % with cefTRIAXone (ROCEPHIN) ADS Med       Note to Pharmacy: Chestine Spore A: cabinet override      01/25/21 2325 01/26/21 1129   01/25/21 2325  sodium chloride 0.9 % with azithromycin (ZITHROMAX) ADS Med       Note to Pharmacy: Chestine Spore A: cabinet override      01/25/21 2325 01/26/21 1129   01/25/21 2315  cefTRIAXone (ROCEPHIN) 2 g in sodium chloride 0.9 % 100 mL IVPB  Status:  Discontinued        2 g 200 mL/hr over 30 Minutes Intravenous Every 24 hours 01/25/21 2310 01/27/21 1309   01/25/21 2315  azithromycin (ZITHROMAX) 500 mg in sodium chloride 0.9 % 250 mL IVPB  Status:  Discontinued        500 mg 250 mL/hr over 60 Minutes Intravenous Every 24 hours 01/25/21 2310 01/27/21 1111       -continue ceftriaxone  -follow up CSF cultures  -? if she will need TEE, add repeat blood cultures to ensure clearance   Acute Metabolic Encephalopathy secondary to Sepsis (POA) Rule Out Meningitis - CT head negative, no meningeal signs on exam.  S/p LP on 12/1 per IR  01/30/2021 - cultures negative so far but still with non focal agitated enephalopathy  Plan - start scheduled haldolw ith QTc monitoring -follow CSF -continue propofol, fentanyl for sedation  -RASS goal 0 to -1, minimize sedation as able   Seizure Like Activity  - Noted on 11/30, Ceribell negative 11/30.  Repeated on 12/1 negative as well.  01/30/2021 - no seizure  plan -continue anti-epileptics, keppra.  ? Duration of AED's -monitor for further seizure like activity  -PRN ativan for seizures   -propofol as above    Hypophosphatemia,  Hypomagnesemia   Plan - replete mag and phos (done 01/30/21) -Trend BMP / urinary output -Avoid nephrotoxic agents, ensure adequate renal perfusion  Anemia   01/30/2021 - hgbs 7s  Plan - PRBC for hgb </= 6.9gm%    - exceptions are   -  if ACS susepcted/confirmed then transfuse for hgb </= 8.0gm%,  or    -  active bleeding with hemodynamic instability, then transfuse regardless of hemoglobin value   At at all times try to transfuse 1 unit prbc as possible with exception of active hemorrhage  Urinary incontience  13/3 - purewick not effective  Plan  - place foley 01/30/21   DM II with Hyperglycemia  -sensitive SSI  -stop D5w   At  Risk Malnutrition  -continue TF  -appreciate Nutrition  -continue folate, thiamine    Best Practice (right click and "Reselect all SmartList Selections" daily)  Diet/type: TF DVT prophylaxis: LMWH  GI prophylaxis: PPI Lines: Central line Foley:  Yes, and it is still needed Code Status:  full code Last date of multidisciplinary goals of care discussion: 12/1   12/3 - updated Rhonda Winters who is the husband - explained recovery could be few days to several weeks   ATTESTATION & SIGNATURE   The patient Rhonda Winters is critically ill with multiple organ systems failure and requires high complexity decision making for assessment and support, frequent evaluation and titration of therapies, application of advanced monitoring technologies and extensive interpretation of multiple databases.   Critical Care Time devoted to patient care services described in this note is  35  Minutes. This time reflects time of care of this signee Dr Brand Males. This critical care time does not reflect procedure time, or teaching time or supervisory time of PA/NP/Med student/Med Resident etc but could involve care discussion time     Dr. Brand Males, M.D., [facility].C.P Pulmonary and Critical Care Medicine Staff Physician [place]  Pulmonary and Critical Care Pager: [phone], If no answer or between  15:00h - 7:00h: call [phone]  01/30/2021 10:32 AM    LABS    PULMONARY Recent Labs  Lab 01/25/21 2328 01/27/21 1233 01/27/21 1428  PHART 7.286* 7.296* 7.354  PCO2ART 49.3* 48.5* 43.3  PO2ART 67.4* 65.7* 194*  HCO3 23.0 23.0 23.5  O2SAT 90.4 88.1 99.5    CBC Recent Labs  Lab 01/28/21 0240 01/29/21 0411 01/30/21 0337  HGB 9.9* 9.3* 7.4*  HCT 30.2* 29.7* 24.3*  WBC 9.5 9.6 10.7*  PLT 218 196 169    COAGULATION Recent Labs  Lab 01/25/21 2048  INR 1.1    CARDIAC  No results for input(s): TROPONINI in the last 168 hours. No results for input(s): PROBNP in the last 168 hours.   CHEMISTRY Recent Labs  Lab 01/28/21 0240 01/28/21 1400 01/29/21 0012 01/29/21 0411 01/29/21 1652 01/30/21 0337  NA 145 145 146* 148* 149* 149*  K 2.4* 2.5* 3.5 3.4* 2.9* 3.2*  CL 109 111 113* 114* 109 116*  CO2 25 28 26 28 29 27   GLUCOSE 143* 241* 186* 185* 184* 186*  BUN 46* 38* 33* 34* 32* 31*  CREATININE 0.96 0.80 0.74 0.73 0.72 0.61  CALCIUM 9.9 9.8 10.0 10.1 10.1 8.0*  MG 1.7  --   --  2.1 1.5* 1.3*  PHOS 2.3*  --   --  1.4* 4.9* 3.0   Estimated Creatinine Clearance: 63.7 mL/min (by C-G formula based on SCr of 0.61 mg/dL).   LIVER Recent Labs  Lab 01/25/21 2048 01/26/21 0302 01/28/21 0240  AST 17 28 23   ALT 20 22 28   ALKPHOS 70 77 75  BILITOT 1.0 0.9 0.8  PROT 7.6 6.7 6.4*  ALBUMIN 3.4* 3.0* 2.8*  INR 1.1  --   --      INFECTIOUS Recent Labs  Lab 01/25/21 2048 01/25/21 2250 01/26/21 0302  LATICACIDVEN 2.5* 1.3 1.7  PROCALCITON  --   --  27.56     ENDOCRINE CBG (last 3)  Recent Labs    01/29/21 1942 01/30/21 0007 01/30/21 0332  GLUCAP 179* 206* 172*         IMAGING x48h  - image(s) personally visualized  -   highlighted in bold MR  BRAIN W WO CONTRAST  Result Date: 01/28/2021 CLINICAL DATA:  Seizure.  Abnormal neuro exam.  History of stroke. EXAM: MRI  HEAD WITHOUT AND WITH CONTRAST TECHNIQUE: Multiplanar, multiecho pulse sequences of the brain and surrounding structures were obtained without and with intravenous contrast. CONTRAST:  78mL GADAVIST GADOBUTROL 1 MMOL/ML IV SOLN COMPARISON:  CT head 01/28/2021.  MRI head 01/26/2021 FINDINGS: Brain: Negative for acute infarct. Chronic right PCA infarct. Moderate chronic microvascular ischemic change in the white matter. 1 cm meningioma right parietal lobe without brain edema. No intracranial hemorrhage. Vascular: Normal arterial flow voids at the skull base. Skull and upper cervical spine: No focal lesion. Sinuses/Orbits: Paranasal sinuses clear. Bilateral cataract extraction Other: None IMPRESSION: Negative for acute infarct. 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 Incidental finding. Electronically Signed   By: Franchot Gallo M.D.   On: 01/28/2021 15:34   DG CHEST PORT 1 VIEW  Result Date: 01/30/2021 CLINICAL DATA:  Follow-up pneumonia EXAM: PORTABLE CHEST 1 VIEW COMPARISON:  Chest x-rays dated 01/27/2021 and 01/25/2021. FINDINGS: Endotracheal tube in place with tip well positioned approximately 3 cm above the level of the carina. LEFT-sided central line is stable in position with tip overlying the RIGHT atrium. Enteric tube passes below the diaphragm. Heart size and mediastinal contours are stable. Improved aeration within the RIGHT upper lobe and RIGHT lower lobe. No new lung findings. No pleural effusion or pneumothorax is seen. IMPRESSION: 1. Improved aeration within the RIGHT upper lobe and RIGHT lower lobe, presumably a resolving pneumonia. No new lung findings. 2. Tubes and lines appear appropriately positioned. Electronically Signed   By: Franki Cabot M.D.   On: 01/30/2021 08:19   DG FLUORO GUIDE LUMBAR PUNCTURE  Result Date: 01/28/2021 CLINICAL DATA:  75 year old female with history of altered mental  status. EXAM: DIAGNOSTIC LUMBAR PUNCTURE UNDER FLUOROSCOPIC GUIDANCE COMPARISON:  No priors FLUOROSCOPY TIME:  Fluoroscopy Time:  30 seconds Radiation Exposure Index (if provided by the fluoroscopic device): 4.5 mGy PROCEDURE: Informed consent was obtained from the patient prior to the procedure, including potential complications of headache, allergy, and pain. With the patient prone, the lower back was prepped with Betadine. 1% Lidocaine was used for local anesthesia. Lumbar puncture was performed at the L2-L3 level using a 20 gauge needle with return of blood-tinged CSF with an opening pressure of 7.5 cm water. Nine ml of CSF were obtained for laboratory studies. The patient tolerated the procedure well and there were no apparent complications. IMPRESSION: 1. Successful uncomplicated fluoroscopic guided lumbar puncture, as above. Electronically Signed   By: Vinnie Langton M.D.   On: 01/28/2021 13:56

## 2021-01-30 NOTE — Progress Notes (Signed)
2D echocardiogram completed.  01/30/2021 1:01 PM Kelby Aline., MHA, RVT, RDCS, RDMS

## 2021-01-30 NOTE — Progress Notes (Signed)
PT Cancellation Note  Patient Details Name: Rhonda Winters MRN: [id number] DOB: 03/22/1945   Cancelled Treatment:    Reason Eval/Treat Not Completed: Patient not medically ready. Per chart review, pt has been intubated since 11/30. Will hold PT for now and sign off. Please reorder once pt is medically ready.    [facility]  Office: [phone] Pager: [phone]

## 2021-01-31 DIAGNOSIS — G9340 Encephalopathy, unspecified: Secondary | ICD-10-CM | POA: Diagnosis not present

## 2021-01-31 DIAGNOSIS — J9601 Acute respiratory failure with hypoxia: Secondary | ICD-10-CM | POA: Diagnosis not present

## 2021-01-31 DIAGNOSIS — J154 Pneumonia due to other streptococci: Secondary | ICD-10-CM | POA: Diagnosis not present

## 2021-01-31 LAB — BASIC METABOLIC PANEL
Anion gap: 3 — ABNORMAL LOW (ref 5–15)
BUN: 35 mg/dL — ABNORMAL HIGH (ref 8–23)
CO2: 31 mmol/L (ref 22–32)
Calcium: 9.5 mg/dL (ref 8.9–10.3)
Chloride: 113 mmol/L — ABNORMAL HIGH (ref 98–111)
Creatinine, Ser: 0.63 mg/dL (ref 0.44–1.00)
GFR, Estimated: >60 mL/min (ref >60)
Glucose, Bld: 200 mg/dL — ABNORMAL HIGH (ref 70–99)
Potassium: 4.1 mmol/L (ref 3.5–5.1)
Sodium: 147 mmol/L — ABNORMAL HIGH (ref 135–145)

## 2021-01-31 LAB — GLUCOSE, CAPILLARY
Glucose-Capillary: 114 mg/dL — ABNORMAL HIGH (ref 70–99)
Glucose-Capillary: 130 mg/dL — ABNORMAL HIGH (ref 70–99)
Glucose-Capillary: 146 mg/dL — ABNORMAL HIGH (ref 70–99)
Glucose-Capillary: 174 mg/dL — ABNORMAL HIGH (ref 70–99)
Glucose-Capillary: 136 mg/dL — ABNORMAL HIGH (ref 70–99)
Glucose-Capillary: 165 mg/dL — ABNORMAL HIGH (ref 70–99)

## 2021-01-31 LAB — CK TOTAL AND CKMB (NOT AT ARMC)
CK, MB: 2.7 ng/mL (ref 0.5–5.0)
Relative Index: INVALID (ref 0.0–2.5)
Total CK: 8 U/L — ABNORMAL LOW (ref 38–234)

## 2021-01-31 LAB — CBC
HCT: 29.8 % — ABNORMAL LOW (ref 36.0–46.0)
Hemoglobin: 9.1 g/dL — ABNORMAL LOW (ref 12.0–15.0)
MCH: 32.4 pg (ref 26.0–34.0)
MCHC: 30.5 g/dL (ref 30.0–36.0)
MCV: 106 fL — ABNORMAL HIGH (ref 80.0–100.0)
Platelets: 207 10*3/uL (ref 150–400)
RBC: 2.81 MIL/uL — ABNORMAL LOW (ref 3.87–5.11)
RDW: 15.2 % (ref 11.5–15.5)
WBC: 12.9 10*3/uL — ABNORMAL HIGH (ref 4.0–10.5)
nRBC: 0.2 % (ref 0.0–0.2)

## 2021-01-31 LAB — PHOSPHORUS: Phosphorus: 3 mg/dL (ref 2.5–4.6)

## 2021-01-31 LAB — TRIGLYCERIDES: Triglycerides: 139 mg/dL (ref <150)

## 2021-01-31 LAB — LACTIC ACID, PLASMA: Lactic Acid, Venous: 1.1 mmol/L (ref 0.5–1.9)

## 2021-01-31 LAB — MAGNESIUM: Magnesium: 2.1 mg/dL (ref 1.7–2.4)

## 2021-01-31 MED ORDER — FUROSEMIDE 10 MG/ML IJ SOLN: 20.0000 mg | Freq: Once | INTRAMUSCULAR | Status: DC

## 2021-01-31 NOTE — Plan of Care (Signed)
  Problem: Safety: Goal: Non-violent Restraint(s) Outcome: Not Progressing   Problem: Education: Goal: Knowledge of General Education information will improve Description: Including pain rating scale, medication(s)/side effects and non-pharmacologic comfort measures Outcome: Not Progressing   Sedated/intubated

## 2021-01-31 NOTE — Progress Notes (Addendum)
NAME:  Rhonda Winters, MRN:  [id number], DOB:  09-06-1945, LOS: 5 ADMISSION DATE:  01/25/2021, CONSULTATION DATE:  01/27/21 REFERRING MD:  Florene Glen, CHIEF COMPLAINT:  AMS   BRIEF  75 year old woman w/ hx of metabolic syndrome presenting with increasing confusion.  Workup has revealed RUL Pneumonia and pneumococcal bacteremia.  Unfortunately became more agitated through day, given ativan but then became obtunded with increased WOB.  Initial dose flumazenil ineffective.  Transferred to ICU and PCCM consulted.     Pertinent  Medical History  Hypothyroidism HTN Prior stroke T2DM  Significant Hospital Events: Including procedures, antibiotic start and stop dates in addition to other pertinent events   11/29 admitted 11/30 decompensation, intubation.  Attempted LP. 12/1 seizure-like activity, Ceribell in progress.  AED's initiated for seizure like movement.  IR LP. CT Head with no acute intracranial abnormalities, chronic small vessel disease, chronic R PCA territory infarct 12.2 - Afebrile . RN reports no seizure activity overnight.  Propofol turned off this am, remains on 50 mcg fentanyl. D5 turned off with hyperglycemia/ Pt waking, following simple commands intermittently, purposeful movement . Blood cultures with pan sensitive strep pneumo. Vent - 40-50% FiO2, PEEP 5 Glucose range 177-185 12/3 - given lasix yesterday -> then soft BP -> needed fluids. Had SVT ->on cardizem and resolved. On 50% fio2. On fent gtt/diprivan gtt (prior intolreance to precedex) -> agitated encephalopathy +. Afebrile  Interim History / Subjective:    12/4 - vent + fio2 40%, . On TF. Doing sBT.  On fent gtt, diprivan gtt, haldol scheduled -> agitateion better since haldo but still very non purposeful . ECHO with elevated PASP and increased LVOT gradient   Objective   Blood pressure (!) 165/70, pulse (!) 103, temperature 99.4 F (37.4 C), temperature source Axillary, resp. rate (!) 25, height 5\' 2"  (1.575 m),  weight 100.9 kg, SpO2 96 %.    Vent Mode: CPAP;PSV FiO2 (%):  [40 %-50 %] 40 % Set Rate:  [26 bmp] 26 bmp Vt Set:  [400 mL] 400 mL PEEP:  [5 cmH20] 5 cmH20 Pressure Support:  [5 cmH20] 5 cmH20 Plateau Pressure:  [18 cmH20-21 cmH20] 18 cmH20   Intake/Output Summary (Last 24 hours) at 01/31/2021 1700 Last data filed at 01/31/2021 1600 Gross per 24 hour  Intake 2173.59 ml  Output 800 ml  Net 1373.59 ml   Filed Weights   01/29/21 0410 01/30/21 0417 01/31/21 0500  Weight: 90.1 kg 90.9 kg 100.9 kg    Examination: General Appearance:  Looks criticall ill OBESE - yes3yes Head:  Normocephalic, without obvious abnormality, atraumatic Eyes:  PERRL - yes, conjunctiva/corneas - mudd     Ears:  Normal external ear canals, both ears Nose:  G tube - no Throat:  ETT TUBE - yes , OG tube - yes Neck:  Supple,  No enlargement/tenderness/nodules Lungs: Clear to auscultation bilaterally, Ventilator   Synchrony - intermittent. 40% Heart:  S1 and S2 normal, no murmur, CVP - no.  Pressors - no Abdomen:  Soft, no masses, no organomegaly Genitalia / Rectal:  Not done Extremities:  Extremities- intact Skin:  ntact in exposed areas . Sacral area - not examined Neurologic:  Sedation - fent gtt, diprivan gtt, haldol schedule -> RASS - +2 . Moves all 4s - yes. CAM-ICU - POSITIVE . Orientation - NOT        Resolved Hospital Problem list    AKI - resolved  Assessment & Plan:   Acute Hypoxemic Respiratory Failure secondary to  Pneumococcal PNA (POA) due to Pneumooccal pneumonia   01/31/2021 - > does not meet criteria for SBT/Extubation in setting of Acute Respiratory Failure due to ongoing encephalopathy   plan - PRVC -> likely nedds trach -daily SBT / WUA  -wean PEEP / FiO2 for sats >90% -follow intermittent CXR  -VAP prevention measures  - no lasix given LVOT graident - > ? Needs cards consult - to be decided 02/01/21 onwards  Severe Sepsis secondary to Pneumococcal PNA with Bacteremia  (POA)  01/31/2021 - afebrile  Plan - Vanc 11/30 - 12/2 - Azitro 11/28 - 11/29 Ceftriaxone 11/30 >> -follow up CSF cultures  -? if she will need TEE, add repeat blood cultures to ensure clearance   Acute Metabolic Encephalopathy secondary to Sepsis (POA) Rule Out Meningitis - CT head negative, no meningeal signs on exam.  S/p LP on 12/1 per IR  01/31/2021 - cultures negative so far but still with non focal agitated enephalopathy - some beter aftrer haldol since 01/30/21  Plan -  scheduled haldolw ith QTc monitoring -follow CSF -continue propofol, fentanyl for sedation   - check CK prn -RASS goal 0 to -1, minimize sedation as able   Seizure Like Activity  - Noted on 11/30, Ceribell negative 11/30.  Repeated on 12/1 negative as well.  01/31/2021 - no seizure  plan -continue anti-epileptics, keppra.  ? Duration of AED's -monitor for further seizure like activity  -PRN ativan for seizures   -propofol as above    Hypophosphatemia, Hypomagnesemia  - trated12/3/22   Plan -Trend BMP / urinary output -Avoid nephrotoxic agents, ensure adequate renal perfusion  Anemia   01/31/2021 - hgbs 9.1  Plan - PRBC for hgb </= 6.9gm%    - exceptions are   -  if ACS susepcted/confirmed then transfuse for hgb </= 8.0gm%,  or    -  active bleeding with hemodynamic instability, then transfuse regardless of hemoglobin value   At at all times try to transfuse 1 unit prbc as possible with exception of active hemorrhage  Urinary incontience  12/3 - purewick not effective an dfoley placed 01/31/21  Plan  -strick IO  DM II with Hyperglycemia  -sensitive SSI  -stop D5w   At Risk Malnutrition  -continue TF  -appreciate Nutrition  -continue folate, thiamine    Best Practice (right click and "Reselect all SmartList Selections" daily)  Diet/type: TF DVT prophylaxis: LMWH  GI prophylaxis: PPI Lines: Central line Foley:  Yes, and it is still needed Code Status:  full code Last date of  multidisciplinary goals of care discussion: 12/1   12/3 - updated Rhonda Winters who is the husband - explained recovery could be few days to several weeks 12/4 - called amd LMTCB    ATTESTATION & SIGNATURE   The patient Rhonda Winters is critically ill with multiple organ systems failure and requires high complexity decision making for assessment and support, frequent evaluation and titration of therapies, application of advanced monitoring technologies and extensive interpretation of multiple databases.   Critical Care Time devoted to patient care services described in this note is  35  Minutes. This time reflects time of care of this signee Dr Brand Males. This critical care time does not reflect procedure time, or teaching time or supervisory time of PA/NP/Med student/Med Resident etc but could involve care discussion time     Dr. Brand Males, M.D., [facility].C.P Pulmonary and Critical Care Medicine Staff Physician [facility] Pager: [phone]  [phone], If no answer or between  15:00h - 7:00h: call [phone]  01/31/2021 5:12 PM     LABS    PULMONARY Recent Labs  Lab 01/25/21 2328 01/27/21 1233 01/27/21 1428  PHART 7.286* 7.296* 7.354  PCO2ART 49.3* 48.5* 43.3  PO2ART 67.4* 65.7* 194*  HCO3 23.0 23.0 23.5  O2SAT 90.4 88.1 99.5    CBC Recent Labs  Lab 01/29/21 0411 01/30/21 0337 01/31/21 0530  HGB 9.3* 7.4* 9.1*  HCT 29.7* 24.3* 29.8*  WBC 9.6 10.7* 12.9*  PLT 196 169 207    COAGULATION Recent Labs  Lab 01/25/21 2048  INR 1.1    CARDIAC  No results for input(s): TROPONINI in the last 168 hours. No results for input(s): PROBNP in the last 168 hours.   CHEMISTRY Recent Labs  Lab 01/28/21 0240 01/28/21 1400 01/29/21 0012 01/29/21 0411 01/29/21 1652 01/30/21 0337 01/30/21 2055 01/31/21 0530  NA 145   < > 146* 148* 149* 149*  --  147*  K 2.4*   < > 3.5 3.4* 2.9* 3.2*  --  4.1  CL 109   < > 113*  114* 109 116*  --  113*  CO2 25   < > 26 28 29 27   --  31  GLUCOSE 143*   < > 186* 185* 184* 186*  --  200*  BUN 46*   < > 33* 34* 32* 31*  --  35*  CREATININE 0.96   < > 0.74 0.73 0.72 0.61  --  0.63  CALCIUM 9.9   < > 10.0 10.1 10.1 8.0*  --  9.5  MG 1.7  --   --  2.1 1.5* 1.3* 2.3 2.1  PHOS 2.3*  --   --  1.4* 4.9* 3.0  --  3.0   < > = values in this interval not displayed.   Estimated Creatinine Clearance: 67.5 mL/min (by C-G formula based on SCr of 0.63 mg/dL).   LIVER Recent Labs  Lab 01/25/21 2048 01/26/21 0302 01/28/21 0240  AST 17 28 23   ALT 20 22 28   ALKPHOS 70 77 75  BILITOT 1.0 0.9 0.8  PROT 7.6 6.7 6.4*  ALBUMIN 3.4* 3.0* 2.8*  INR 1.1  --   --      INFECTIOUS Recent Labs  Lab 01/25/21 2250 01/26/21 0302 01/31/21 0937  LATICACIDVEN 1.3 1.7 1.1  PROCALCITON  --  27.56  --      ENDOCRINE CBG (last 3)  Recent Labs    01/31/21 0344 01/31/21 0749 01/31/21 1147  GLUCAP 114* 130* 146*         IMAGING x48h  - image(s) personally visualized  -   highlighted in bold DG CHEST PORT 1 VIEW  Result Date: 01/30/2021 CLINICAL DATA:  Follow-up pneumonia EXAM: PORTABLE CHEST 1 VIEW COMPARISON:  Chest x-rays dated 01/27/2021 and 01/25/2021. FINDINGS: Endotracheal tube in place with tip well positioned approximately 3 cm above the level of the carina. LEFT-sided central line is stable in position with tip overlying the RIGHT atrium. Enteric tube passes below the diaphragm. Heart size and mediastinal contours are stable. Improved aeration within the RIGHT upper lobe and RIGHT lower lobe. No new lung findings. No pleural effusion or pneumothorax is seen. IMPRESSION: 1. Improved aeration within the RIGHT upper lobe and RIGHT lower lobe, presumably a resolving pneumonia. No new lung findings. 2. Tubes and lines appear appropriately positioned. Electronically Signed   By: Franki Cabot M.D.   On: 01/30/2021 08:19  ECHOCARDIOGRAM COMPLETE  Result Date: 01/30/2021     ECHOCARDIOGRAM REPORT   Patient Name:   KEYARRA RENDALL [facility] Date of Exam: 01/30/2021 Medical Rec #:  [id number]        Height:       62.0 in Accession #:    [id number]       Weight:       200.4 lb Date of Birth:  1945-07-17         BSA:          1.913 m Patient Age:    19 years         BP:           165/63 mmHg Patient Gender: F                HR:           86 bpm. Exam Location:  Inpatient Procedure: 2D Echo, Cardiac Doppler and Color Doppler Indications:    Acute respiratory distress  History:        Patient has prior history of Echocardiogram examinations, most                 recent 06/30/2020. Stroke; Risk Factors:Diabetes and Hypertension.  Sonographer:    Maudry Mayhew MHA, RDMS, RVT, RDCS Referring Phys: [id number] [facility]  Sonographer Comments: Echo performed with patient supine and on artificial respirator and patient is morbidly obese. Image acquisition challenging due to respiratory motion. IMPRESSIONS  1. Left ventricular ejection fraction, by estimation, is 70 to 75%. The left ventricle has hyperdynamic function. The left ventricle has no regional wall motion abnormalities. Left ventricular diastolic function could not be evaluated.  2. Right ventricular systolic function is moderately reduced. The right ventricular size is moderately enlarged. There is moderately elevated pulmonary artery systolic pressure.  3. The mitral valve is normal in structure. No evidence of mitral valve regurgitation.  4. The aortic valve is seen to open well. The AV gradient measured is due to a dynamic LVOT gradient . The aortic valve is normal in structure. Aortic valve regurgitation is not visualized. No aortic stenosis is present.  5. Aortic dilatation noted. There is moderate dilatation of the ascending aorta, measuring 50 mm. FINDINGS  Left Ventricle: Left ventricular ejection fraction, by estimation, is 70 to 75%. The left ventricle has hyperdynamic function. The left ventricle has no regional wall motion  abnormalities. The left ventricular internal cavity size was small. There is no  left ventricular hypertrophy. Left ventricular diastolic function could not be evaluated due to atrial fibrillation. Left ventricular diastolic function could not be evaluated. Right Ventricle: The right ventricular size is moderately enlarged. Right vetricular wall thickness was not well visualized. Right ventricular systolic function is moderately reduced. There is moderately elevated pulmonary artery systolic pressure. The tricuspid regurgitant velocity is 3.14 m/s, and with an assumed right atrial pressure of 8 mmHg, the estimated right ventricular systolic pressure is 33.3 mmHg. Left Atrium: Left atrial size was normal in size. Right Atrium: Right atrial size was normal in size. Pericardium: There is no evidence of pericardial effusion. Mitral Valve: The mitral valve is normal in structure. No evidence of mitral valve regurgitation. Tricuspid Valve: The tricuspid valve is grossly normal. Tricuspid valve regurgitation is mild. Aortic Valve: The aortic valve is seen to open well. The AV gradient measured is due to a dynamic LVOT gradient. The aortic valve is normal in structure. Aortic valve regurgitation is not visualized. No aortic stenosis is present. Aortic valve mean  gradient measures 11.0 mmHg. Aortic valve peak gradient measures 26.8 mmHg. Aortic valve area, by VTI measures 1.70 cm. Pulmonic Valve: The pulmonic valve was grossly normal. Pulmonic valve regurgitation is not visualized. Aorta: Aortic dilatation noted. There is moderate dilatation of the ascending aorta, measuring 50 mm. IAS/Shunts: The interatrial septum was not well visualized.  LEFT VENTRICLE PLAX 2D LVIDd:         4.90 cm   Diastology LVIDs:         2.80 cm   LV e' medial:    5.22 cm/s LV PW:         0.70 cm   LV E/e' medial:  16.8 LV IVS:        0.70 cm   LV e' lateral:   7.94 cm/s LVOT diam:     1.80 cm   LV E/e' lateral: 11.1 LV SV:         64 LV SV Index:    34 LVOT Area:     2.54 cm  RIGHT VENTRICLE RV S prime:     7.27 cm/s TAPSE (M-mode): 1.6 cm LEFT ATRIUM             Index        RIGHT ATRIUM           Index LA diam:        2.10 cm 1.10 cm/m   RA Area:     13.00 cm LA Vol (A2C):   52.7 ml 27.54 ml/m  RA Volume:   26.20 ml  13.69 ml/m LA Vol (A4C):   64.9 ml 33.92 ml/m LA Biplane Vol: 60.9 ml 31.83 ml/m  AORTIC VALVE AV Area (Vmax):    1.51 cm AV Area (Vmean):   1.55 cm AV Area (VTI):     1.70 cm AV Vmax:           259.00 cm/s AV Vmean:          153.000 cm/s AV VTI:            0.377 m AV Peak Grad:      26.8 mmHg AV Mean Grad:      11.0 mmHg LVOT Vmax:         154.00 cm/s LVOT Vmean:        93.000 cm/s LVOT VTI:          0.252 m LVOT/AV VTI ratio: 0.67  AORTA Ao Root diam: 2.70 cm Ao Asc diam:  4.63 cm MITRAL VALVE                TRICUSPID VALVE MV Area (PHT): 2.66 cm     TR Peak grad:   39.4 mmHg MV Decel Time: 285 msec     TR Vmax:        314.00 cm/s MR Peak grad: 9.9 mmHg MR Vmax:      157.00 cm/s   SHUNTS MV E velocity: 87.95 cm/s   Systemic VTI:  0.25 m MV A velocity: 106.00 cm/s  Systemic Diam: 1.80 cm MV E/A ratio:  0.83 MV A Prime:    8.0 cm/s Mertie Moores MD Electronically signed by Mertie Moores MD Signature Date/Time: 01/30/2021/2:50:29 PM    Final

## 2021-01-31 NOTE — Progress Notes (Signed)
Patient placed in wean mode: CPAP/PS 5/5. Patient appears to be tolerating at this time

## 2021-02-01 ENCOUNTER — Inpatient Hospital Stay (HOSPITAL_COMMUNITY): Payer: Medicare Other

## 2021-02-01 DIAGNOSIS — J154 Pneumonia due to other streptococci: Secondary | ICD-10-CM | POA: Diagnosis not present

## 2021-02-01 DIAGNOSIS — G9340 Encephalopathy, unspecified: Secondary | ICD-10-CM | POA: Diagnosis not present

## 2021-02-01 DIAGNOSIS — A419 Sepsis, unspecified organism: Secondary | ICD-10-CM | POA: Diagnosis not present

## 2021-02-01 DIAGNOSIS — J9601 Acute respiratory failure with hypoxia: Secondary | ICD-10-CM | POA: Diagnosis not present

## 2021-02-01 DIAGNOSIS — J9621 Acute and chronic respiratory failure with hypoxia: Secondary | ICD-10-CM

## 2021-02-01 DIAGNOSIS — N179 Acute kidney failure, unspecified: Secondary | ICD-10-CM | POA: Diagnosis not present

## 2021-02-01 LAB — GLUCOSE, CAPILLARY
Glucose-Capillary: 137 mg/dL — ABNORMAL HIGH (ref 70–99)
Glucose-Capillary: 140 mg/dL — ABNORMAL HIGH (ref 70–99)
Glucose-Capillary: 166 mg/dL — ABNORMAL HIGH (ref 70–99)
Glucose-Capillary: 170 mg/dL — ABNORMAL HIGH (ref 70–99)
Glucose-Capillary: 173 mg/dL — ABNORMAL HIGH (ref 70–99)
Glucose-Capillary: 174 mg/dL — ABNORMAL HIGH (ref 70–99)

## 2021-02-01 LAB — CBC
HCT: 27.2 % — ABNORMAL LOW (ref 36.0–46.0)
Hemoglobin: 8.1 g/dL — ABNORMAL LOW (ref 12.0–15.0)
MCH: 31.8 pg (ref 26.0–34.0)
MCHC: 29.8 g/dL — ABNORMAL LOW (ref 30.0–36.0)
MCV: 106.7 fL — ABNORMAL HIGH (ref 80.0–100.0)
Platelets: 199 10*3/uL (ref 150–400)
RBC: 2.55 MIL/uL — ABNORMAL LOW (ref 3.87–5.11)
RDW: 15 % (ref 11.5–15.5)
WBC: 10.1 10*3/uL (ref 4.0–10.5)
nRBC: 0.2 % (ref 0.0–0.2)

## 2021-02-01 LAB — BASIC METABOLIC PANEL
Anion gap: 5 (ref 5–15)
BUN: 34 mg/dL — ABNORMAL HIGH (ref 8–23)
CO2: 31 mmol/L (ref 22–32)
Calcium: 9.5 mg/dL (ref 8.9–10.3)
Chloride: 111 mmol/L (ref 98–111)
Creatinine, Ser: 0.69 mg/dL (ref 0.44–1.00)
GFR, Estimated: >60 mL/min (ref >60)
Glucose, Bld: 199 mg/dL — ABNORMAL HIGH (ref 70–99)
Potassium: 4 mmol/L (ref 3.5–5.1)
Sodium: 147 mmol/L — ABNORMAL HIGH (ref 135–145)

## 2021-02-01 LAB — CSF CULTURE W GRAM STAIN: Culture: NO GROWTH

## 2021-02-01 LAB — MAGNESIUM: Magnesium: 2 mg/dL (ref 1.7–2.4)

## 2021-02-01 LAB — PHOSPHORUS: Phosphorus: 3 mg/dL (ref 2.5–4.6)

## 2021-02-01 LAB — PATHOLOGIST SMEAR REVIEW

## 2021-02-01 MED ORDER — FENTANYL CITRATE (PF) 100 MCG/2ML IJ SOLN
INTRAMUSCULAR | Status: AC
  Administered 2021-02-01: 50 ug via INTRAVENOUS
  Filled 2021-02-01: qty 2

## 2021-02-01 MED ORDER — FENTANYL BOLUS VIA INFUSION
50.0000 ug | INTRAVENOUS | Status: DC | PRN
Start: 2021-02-01 — End: 2021-02-03
  Administered 2021-02-01 (×4): 50 ug via INTRAVENOUS
  Administered 2021-02-01: 100 ug via INTRAVENOUS
  Administered 2021-02-01 (×3): 50 ug via INTRAVENOUS
  Administered 2021-02-02: 150 ug via INTRAVENOUS
  Administered 2021-02-02 (×2): 100 ug via INTRAVENOUS
  Administered 2021-02-02: 50 ug via INTRAVENOUS
  Administered 2021-02-02: 100 ug via INTRAVENOUS
  Administered 2021-02-02: 150 ug via INTRAVENOUS
  Administered 2021-02-02: 50 ug via INTRAVENOUS
  Administered 2021-02-02: 100 ug via INTRAVENOUS
  Administered 2021-02-02: 50 ug via INTRAVENOUS
  Administered 2021-02-02 (×5): 100 ug via INTRAVENOUS
  Administered 2021-02-03 (×2): 50 ug via INTRAVENOUS
  Administered 2021-02-03: 150 ug via INTRAVENOUS
  Administered 2021-02-03: 100 ug via INTRAVENOUS
  Administered 2021-02-03: 200 ug via INTRAVENOUS
  Administered 2021-02-03 (×2): 100 ug via INTRAVENOUS
  Administered 2021-02-03: 200 ug via INTRAVENOUS
  Administered 2021-02-03 (×2): 50 ug via INTRAVENOUS
  Administered 2021-02-03: 200 ug via INTRAVENOUS
  Administered 2021-02-03: 100 ug via INTRAVENOUS
  Filled 2021-02-01: qty 200

## 2021-02-01 MED ORDER — MIDAZOLAM HCL 2 MG/2ML IJ SOLN
INTRAMUSCULAR | Status: AC
  Administered 2021-02-01: 1 mg via INTRAVENOUS
  Filled 2021-02-01: qty 2

## 2021-02-01 MED ORDER — ALBUTEROL SULFATE (2.5 MG/3ML) 0.083% IN NEBU: 2.5000 mg | INHALATION_SOLUTION | RESPIRATORY_TRACT | Status: DC | PRN

## 2021-02-01 MED ORDER — FENTANYL CITRATE (PF) 100 MCG/2ML IJ SOLN: 50.0000 ug | Freq: Once | INTRAMUSCULAR | Status: AC

## 2021-02-01 MED ORDER — MIDAZOLAM HCL 2 MG/2ML IJ SOLN: 1.0000 mg | Freq: Once | INTRAMUSCULAR | Status: AC

## 2021-02-01 MED ORDER — MIDAZOLAM HCL 2 MG/2ML IJ SOLN
INTRAMUSCULAR | Status: AC
  Administered 2021-02-01: 2 mg via INTRAVENOUS
  Filled 2021-02-01: qty 2

## 2021-02-01 MED ORDER — ROCURONIUM BROMIDE 50 MG/5ML IV SOLN
100.0000 mg | Freq: Once | INTRAVENOUS | Status: AC
  Filled 2021-02-01: qty 10

## 2021-02-01 MED ORDER — RACEPINEPHRINE HCL 2.25 % IN NEBU
INHALATION_SOLUTION | RESPIRATORY_TRACT | Status: AC
  Filled 2021-02-01: qty 0.5

## 2021-02-01 MED ORDER — MIDAZOLAM HCL 2 MG/2ML IJ SOLN: 2.0000 mg | Freq: Once | INTRAMUSCULAR | Status: AC

## 2021-02-01 MED ORDER — GABAPENTIN 250 MG/5ML PO SOLN
100.0000 mg | Freq: Three times a day (TID) | ORAL | Status: DC
  Administered 2021-02-01 – 2021-02-12 (×30): 100 mg
  Filled 2021-02-01 (×38): qty 2

## 2021-02-01 MED ORDER — ETOMIDATE 2 MG/ML IV SOLN: 20.0000 mg | Freq: Once | INTRAVENOUS | Status: AC

## 2021-02-01 MED ORDER — ROCURONIUM BROMIDE 10 MG/ML (PF) SYRINGE
PREFILLED_SYRINGE | INTRAVENOUS | Status: AC
  Administered 2021-02-01: 100 mg via INTRAVENOUS
  Filled 2021-02-01: qty 10

## 2021-02-01 MED ORDER — DEXMEDETOMIDINE HCL IN NACL 200 MCG/50ML IV SOLN
0.4000 ug/kg/h | INTRAVENOUS | Status: DC
  Administered 2021-02-01: 1 ug/kg/h via INTRAVENOUS
  Administered 2021-02-01: 0.4 ug/kg/h via INTRAVENOUS
  Administered 2021-02-01: 0.8 ug/kg/h via INTRAVENOUS
  Administered 2021-02-01: 0.5 ug/kg/h via INTRAVENOUS
  Administered 2021-02-01: 1 ug/kg/h via INTRAVENOUS
  Administered 2021-02-01: 0.8 ug/kg/h via INTRAVENOUS
  Administered 2021-02-02 (×2): 1.2 ug/kg/h via INTRAVENOUS
  Administered 2021-02-02: 1.1 ug/kg/h via INTRAVENOUS
  Administered 2021-02-02 (×4): 1.2 ug/kg/h via INTRAVENOUS
  Administered 2021-02-02: 1 ug/kg/h via INTRAVENOUS
  Administered 2021-02-02: 1.2 ug/kg/h via INTRAVENOUS
  Filled 2021-02-01 (×11): qty 50
  Filled 2021-02-01: qty 100
  Filled 2021-02-01 (×4): qty 50

## 2021-02-01 MED ORDER — MIDAZOLAM HCL 2 MG/2ML IJ SOLN
2.0000 mg | Freq: Four times a day (QID) | INTRAMUSCULAR | Status: DC | PRN
  Administered 2021-02-02 – 2021-02-03 (×2): 2 mg via INTRAVENOUS
  Administered 2021-02-04: 4 mg via INTRAVENOUS
  Administered 2021-02-05 – 2021-02-10 (×5): 2 mg via INTRAVENOUS
  Filled 2021-02-01 (×14): qty 2

## 2021-02-01 MED ORDER — DULOXETINE HCL 30 MG PO CPEP: 60.0000 mg | ORAL_CAPSULE | Freq: Every day | ORAL | Status: DC

## 2021-02-01 MED ORDER — ETOMIDATE 2 MG/ML IV SOLN
INTRAVENOUS | Status: AC
  Administered 2021-02-01: 20 mg via INTRAVENOUS
  Filled 2021-02-01: qty 20

## 2021-02-01 MED ORDER — FUROSEMIDE 10 MG/ML IJ SOLN
40.0000 mg | Freq: Once | INTRAMUSCULAR | Status: AC
  Administered 2021-02-01: 40 mg via INTRAVENOUS
  Filled 2021-02-01: qty 4

## 2021-02-01 NOTE — Progress Notes (Signed)
NAME:  Rhonda Winters, MRN:  [id number], DOB:  1945/10/17, LOS: 6 ADMISSION DATE:  01/25/2021, CONSULTATION DATE:  01/27/21 REFERRING MD:  Florene Glen, CHIEF COMPLAINT:  AMS   BRIEF  75 year old woman w/ hx of metabolic syndrome presenting with increasing confusion.  Workup has revealed RUL Pneumonia and pneumococcal bacteremia.  Unfortunately became more agitated through day, given ativan but then became obtunded with increased WOB.  Initial dose flumazenil ineffective.  Transferred to ICU and PCCM consulted.     Pertinent  Medical History  Hypothyroidism HTN Prior stroke T2DM  Significant Hospital Events: Including procedures, antibiotic start and stop dates in addition to other pertinent events   11/29 admitted 11/30 decompensation, intubation.  Attempted LP. 12/1 seizure-like activity, Ceribell in progress.  AED's initiated for seizure like movement.  IR LP. CT Head with no acute intracranial abnormalities, chronic small vessel disease, chronic R PCA territory infarct 12.2 - Afebrile . RN reports no seizure activity overnight.  Propofol turned off this am, remains on 50 mcg fentanyl. D5 turned off with hyperglycemia/ Pt waking, following simple commands intermittently, purposeful movement . Blood cultures with pan sensitive strep pneumo. Vent - 40-50% FiO2, PEEP 5 Glucose range 177-185 12/3 - given lasix yesterday -> then soft BP -> needed fluids. Had SVT ->on cardizem and resolved. On 50% fio2. On fent gtt/diprivan gtt (prior intolreance to precedex) -> agitated encephalopathy +. Afebrile 12/5 SBT done on prop/fent and failed 2/2 small TV, trial Precedex again.    Interim History / Subjective:   Pt weaned well on PS/CPAP yesterday per RT Failed SBT on propofol 92mcg and Fentanyl 41mcg +5L since admission   Objective   Blood pressure (!) 139/56, pulse 76, temperature 98.1 F (36.7 C), temperature source Axillary, resp. rate (!) 23, height 5\' 2"  (1.575 m), weight 100.9 kg, SpO2 91  %.    Vent Mode: PRVC FiO2 (%):  [40 %] 40 % Set Rate:  [26 bmp] 26 bmp Vt Set:  [400 mL] 400 mL PEEP:  [5 cmH20] 5 cmH20 Pressure Support:  [5 cmH20] 5 cmH20 Plateau Pressure:  [16 cmH20-20 cmH20] 19 cmH20   Intake/Output Summary (Last 24 hours) at 02/01/2021 0736 Last data filed at 02/01/2021 5809 Gross per 24 hour  Intake 1523.49 ml  Output 1600 ml  Net -76.51 ml    Filed Weights   01/29/21 0410 01/30/21 0417 01/31/21 0500  Weight: 90.1 kg 90.9 kg 100.9 kg    General:  critically ill-appearing F, intubated and sedated, opening eyes to voice HEENT: MM pink/moist, ETT in place, PERRLA Neuro: examined on propofol and fentanyl,  opens eyes to voice, restless, not purposeful or following commands CV: s1s2 rrr, no m/r/g PULM:  decreased air entry bilateral bases, mechanical vent sounds without significant rhonchi or wheezing GI: soft, bsx4 active  Extremities: warm/dry, no edema  Skin: no rashes or lesions     Resolved Hospital Problem list    AKI - resolved  Assessment & Plan:   Acute Hypoxemic Respiratory Failure secondary to Pneumococcal PNA (POA) due to Pneumooccal pneumonia Intubated 11/30, difficulty progressing towards extubation secondary to agitation Plan -Trial Precedex again, attempt SBT again later today, may need to discuss trach -continue Ceftriaxone, likely 7 day duration, monitor fever curve --Maintain full vent support with SAT/SBT as tolerated -titrate Vent setting to maintain SpO2 greater than or equal to 90%. -HOB elevated 30 degrees. -Plateau pressures less than 30 cm H20.  -Follow chest x-ray, ABG prn.   -Bronchial hygiene and  RT/bronchodilator protocol. 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  Repeated on 12/1 negative as well. -on Keppra, can likely d/c as no further seizure-like activity -prn Ativan ordered   Hypophosphatemia, Hypomagnesemia   -replete as needed  Anemia  -stable, no signs of bleeding -transfuse for hgb <7   Urinary incontience -follow I/O  DM II with Hyperglycemia  -continue SSI   At Risk Malnutrition  -continue TF  -appreciate Nutrition  -continue folate, thiamine    Best Practice (right click and "Reselect all SmartList Selections" daily)  Diet/type: TF DVT prophylaxis: LMWH  GI prophylaxis: PPI Lines: Central line Foley:  Yes, and it is still needed Code Status:  full code Last date of multidisciplinary goals of care discussion: 12/1   Pending family update 12/5   ATTESTATION & SIGNATURE   CRITICAL CARE Performed by: Otilio Carpen Jaquanda Wickersham   Total critical care time: 42 minutes  Critical care time was exclusive of separately billable procedures and treating other patients.  Critical care was necessary to treat or prevent imminent or life-threatening deterioration.  Critical care was time spent personally by me on the following activities: development of treatment plan with patient and/or surrogate as well as nursing, discussions with consultants, evaluation of patient's response to treatment, examination of patient, obtaining history from patient or surrogate, ordering and performing treatments and interventions, ordering and review of laboratory studies, ordering and review of radiographic studies, pulse oximetry and re-evaluation of patient's condition.   Otilio Carpen Concha Sudol, PA-C [facility] See Amion for pager If no response to pager , please call [phone] until  7pm After 7:00 pm call Elink  [phone]  Lab 01/25/21 2328 01/27/21 1233 01/27/21 1428  PHART 7.286* 7.296* 7.354  PCO2ART 49.3* 48.5* 43.3  PO2ART 67.4* 65.7* 194*  HCO3 23.0 23.0 23.5  O2SAT 90.4 88.1 99.5     CBC Recent Labs  Lab 01/30/21 0337 01/31/21 0530 02/01/21 0323  HGB 7.4* 9.1* 8.1*  HCT 24.3* 29.8* 27.2*  WBC 10.7* 12.9* 10.1  PLT 169 207 199     COAGULATION Recent Labs  Lab 01/25/21 2048  INR 1.1     CARDIAC  No results for input(s): TROPONINI in the last 168 hours. No results for input(s): PROBNP in the last 168 hours.   CHEMISTRY Recent Labs  Lab 01/29/21 0411 01/29/21 1652 01/30/21 0337 01/30/21 2055 01/31/21 0530 02/01/21 0323  NA 148* 149* 149*  --  147* 147*  K 3.4* 2.9* 3.2*  --  4.1 4.0  CL 114* 109 116*  --  113* 111  CO2 28 29 27   --  31 31  GLUCOSE 185* 184* 186*  --  200* 199*  BUN 34* 32* 31*  --  35* 34*  CREATININE 0.73 0.72 0.61  --  0.63 0.69  CALCIUM 10.1 10.1 8.0*  --  9.5 9.5  MG 2.1 1.5* 1.3* 2.3 2.1 2.0  PHOS 1.4* 4.9* 3.0  --  3.0 3.0    Estimated Creatinine Clearance: 67.5  mL/min (by C-G formula based on SCr of 0.69 mg/dL).   LIVER Recent Labs  Lab 01/25/21 2048 01/26/21 0302 01/28/21 0240  AST 17 28 23   ALT 20 22 28   ALKPHOS 70 77 75  BILITOT 1.0 0.9 0.8  PROT 7.6 6.7 6.4*  ALBUMIN 3.4* 3.0* 2.8*  INR 1.1  --   --       INFECTIOUS Recent Labs  Lab 01/25/21 2250 01/26/21 0302 01/31/21 0937  LATICACIDVEN 1.3 1.7 1.1  PROCALCITON  --  27.56  --       ENDOCRINE CBG (last 3)  Recent Labs    01/31/21 1937 01/31/21 2327 02/01/21 0344  GLUCAP 136* 165* 166*          IMAGING x48h  - image(s) personally visualized  -   highlighted in bold ECHOCARDIOGRAM COMPLETE  Result Date: 01/30/2021    ECHOCARDIOGRAM REPORT   Patient Name:   DILCIA RYBARCZYK Zou Date of Exam: 01/30/2021 Medical Rec #:  [id number]        Height:       62.0 in Accession #:     [id number]       Weight:       200.4 lb Date of Birth:  01/17/1946         BSA:          1.913 m Patient Age:    50 years         BP:           165/63 mmHg Patient Gender: F                HR:           86 bpm. Exam Location:  Inpatient Procedure: 2D Echo, Cardiac Doppler and Color Doppler Indications:    Acute respiratory distress  History:        Patient has prior history of Echocardiogram examinations, most                 recent 06/30/2020. Stroke; Risk Factors:Diabetes and Hypertension.  Sonographer:    Maudry Mayhew MHA, RDMS, RVT, RDCS Referring Phys: [id number]  Sonographer Comments: Echo performed with patient supine and on artificial respirator and patient is morbidly obese. Image acquisition challenging due to respiratory motion. IMPRESSIONS  1. Left ventricular ejection fraction, by estimation, is 70 to 75%. The left ventricle has hyperdynamic function. The left ventricle has no regional wall motion abnormalities. Left ventricular diastolic function could not be evaluated.  2. Right ventricular systolic function is moderately reduced. The right ventricular size is moderately enlarged. There is moderately elevated pulmonary artery systolic pressure.  3. The mitral valve is normal in structure. No evidence of mitral valve regurgitation.  4. The aortic valve is seen to open well. The AV gradient measured is due to a dynamic LVOT gradient . The aortic valve is normal in structure. Aortic valve regurgitation is not visualized. No aortic stenosis is present.  5. Aortic dilatation noted. There is moderate dilatation of the ascending aorta, measuring 50 mm. FINDINGS  Left Ventricle: Left ventricular ejection fraction, by estimation, is 70 to 75%. The left ventricle has hyperdynamic function. The left ventricle has no regional wall motion abnormalities. The left ventricular internal cavity size was small. There is no  left ventricular hypertrophy. Left ventricular diastolic function could not be  evaluated due to atrial fibrillation. Left ventricular diastolic function could not be evaluated. Right Ventricle: The right ventricular size is moderately enlarged. Right vetricular wall thickness  was not well visualized. Right ventricular systolic function is moderately reduced. There is moderately elevated pulmonary artery systolic pressure. The tricuspid regurgitant velocity is 3.14 m/s, and with an assumed right atrial pressure of 8 mmHg, the estimated right ventricular systolic pressure is 19.4 mmHg. Left Atrium: Left atrial size was normal in size. Right Atrium: Right atrial size was normal in size. Pericardium: There is no evidence of pericardial effusion. Mitral Valve: The mitral valve is normal in structure. No evidence of mitral valve regurgitation. Tricuspid Valve: The tricuspid valve is grossly normal. Tricuspid valve regurgitation is mild. Aortic Valve: The aortic valve is seen to open well. The AV gradient measured is due to a dynamic LVOT gradient. The aortic valve is normal in structure. Aortic valve regurgitation is not visualized. No aortic stenosis is present. Aortic valve mean gradient measures 11.0 mmHg. Aortic valve peak gradient measures 26.8 mmHg. Aortic valve area, by VTI measures 1.70 cm. Pulmonic Valve: The pulmonic valve was grossly normal. Pulmonic valve regurgitation is not visualized. Aorta: Aortic dilatation noted. There is moderate dilatation of the ascending aorta, measuring 50 mm. IAS/Shunts: The interatrial septum was not well visualized.  LEFT VENTRICLE PLAX 2D LVIDd:         4.90 cm   Diastology LVIDs:         2.80 cm   LV e' medial:    5.22 cm/s LV PW:         0.70 cm   LV E/e' medial:  16.8 LV IVS:        0.70 cm   LV e' lateral:   7.94 cm/s LVOT diam:     1.80 cm   LV E/e' lateral: 11.1 LV SV:         64 LV SV Index:   34 LVOT Area:     2.54 cm  RIGHT VENTRICLE RV S prime:     7.27 cm/s TAPSE (M-mode): 1.6 cm LEFT ATRIUM             Index        RIGHT ATRIUM            Index LA diam:        2.10 cm 1.10 cm/m   RA Area:     13.00 cm LA Vol (A2C):   52.7 ml 27.54 ml/m  RA Volume:   26.20 ml  13.69 ml/m LA Vol (A4C):   64.9 ml 33.92 ml/m LA Biplane Vol: 60.9 ml 31.83 ml/m  AORTIC VALVE AV Area (Vmax):    1.51 cm AV Area (Vmean):   1.55 cm AV Area (VTI):     1.70 cm AV Vmax:           259.00 cm/s AV Vmean:          153.000 cm/s AV VTI:            0.377 m AV Peak Grad:      26.8 mmHg AV Mean Grad:      11.0 mmHg LVOT Vmax:         154.00 cm/s LVOT Vmean:        93.000 cm/s LVOT VTI:          0.252 m LVOT/AV VTI ratio: 0.67  AORTA Ao Root diam: 2.70 cm Ao Asc diam:  4.63 cm MITRAL VALVE                TRICUSPID VALVE MV Area (PHT): 2.66 cm     TR Peak grad:  39.4 mmHg MV Decel Time: 285 msec     TR Vmax:        314.00 cm/s MR Peak grad: 9.9 mmHg MR Vmax:      157.00 cm/s   SHUNTS MV E velocity: 87.95 cm/s   Systemic VTI:  0.25 m MV A velocity: 106.00 cm/s  Systemic Diam: 1.80 cm MV E/A ratio:  0.83 MV A Prime:    8.0 cm/s Mertie Moores MD Electronically signed by Mertie Moores MD Signature Date/Time: 01/30/2021/2:50:29 PM    Final

## 2021-02-01 NOTE — Procedures (Signed)
Intubation Procedure Note  Rhonda Winters  [id number]  [birth year]  Date:02/01/21  Time:2:07 PM   Provider Performing:Jadeyn Hargett R Rmani Kapusta    Procedure: Intubation (31500)  Indication(s) Respiratory Failure  Consent Unable to obtain consent due to emergent nature of procedure.   Anesthesia Etomidate, Versed, Fentanyl, and Rocuronium   Time Out Verified patient identification, verified procedure, site/side was marked, verified correct patient position, special equipment/implants available, medications/allergies/relevant history reviewed, required imaging and test results available.   Sterile Technique Usual hand hygeine, masks, and gloves were used   Procedure Description Patient positioned in bed supine.  Sedation given as noted above.  Patient was intubated with endotracheal tube using Glidescope.  View was Grade 1 full glottis .  Number of attempts was 1.  Colorimetric CO2 detector was consistent with tracheal placement.   Complications/Tolerance None; patient tolerated the procedure well. Chest X-ray is ordered to verify placement.   EBL Minimal   Specimen(s) None   Otilio Carpen Genie Wenke, PA-C

## 2021-02-01 NOTE — Progress Notes (Signed)
OT Cancellation Note  Patient Details Name: Rhonda Winters MRN: [id number] DOB: 1946-01-12   Cancelled Treatment:    Reason Eval/Treat Not Completed: Medical issues which prohibited therapy patient has been intubated since 11/30. OT to hold at this time and sign off. Please re order therapy when patient is medically stable.  Jackelyn Poling OTR/L, [facility] Office# [phone] Pager# [phone]    02/01/2021, 6:49 AM

## 2021-02-01 NOTE — Procedures (Signed)
Extubation Procedure Note  Patient Details:   Name: Rhonda Winters DOB: 1945/12/07 MRN: [id number]   Airway Documentation:    Vent end date: 02/01/21 Vent end time: 1158   Evaluation  O2 sats: currently acceptable Complications: No apparent complications Patient did tolerate procedure well. Bilateral Breath Sounds: Clear, Diminished   No  Pt was extubated to 6L [place] per CCMD order. Pt was suctioned and had a positive cuff leak prior to extubation. Pt was not able to speak or follow commands after extubation, CCMD is aware of this. No stridor is heard at this time.   Bridgitt Raggio A Brendaliz Kuk 02/01/2021, 11:59 AM

## 2021-02-02 ENCOUNTER — Inpatient Hospital Stay (HOSPITAL_COMMUNITY): Payer: Medicare Other

## 2021-02-02 DIAGNOSIS — A419 Sepsis, unspecified organism: Secondary | ICD-10-CM | POA: Diagnosis not present

## 2021-02-02 DIAGNOSIS — J13 Pneumonia due to Streptococcus pneumoniae: Secondary | ICD-10-CM

## 2021-02-02 DIAGNOSIS — G9340 Encephalopathy, unspecified: Secondary | ICD-10-CM | POA: Diagnosis not present

## 2021-02-02 DIAGNOSIS — R410 Disorientation, unspecified: Secondary | ICD-10-CM | POA: Diagnosis not present

## 2021-02-02 DIAGNOSIS — R4182 Altered mental status, unspecified: Secondary | ICD-10-CM

## 2021-02-02 DIAGNOSIS — J9601 Acute respiratory failure with hypoxia: Secondary | ICD-10-CM | POA: Diagnosis not present

## 2021-02-02 LAB — CBC
HCT: 27.5 % — ABNORMAL LOW (ref 36.0–46.0)
Hemoglobin: 8.5 g/dL — ABNORMAL LOW (ref 12.0–15.0)
MCH: 32.2 pg (ref 26.0–34.0)
MCHC: 30.9 g/dL (ref 30.0–36.0)
MCV: 104.2 fL — ABNORMAL HIGH (ref 80.0–100.0)
Platelets: 203 10*3/uL (ref 150–400)
RBC: 2.64 MIL/uL — ABNORMAL LOW (ref 3.87–5.11)
RDW: 14.5 % (ref 11.5–15.5)
WBC: 10.7 10*3/uL — ABNORMAL HIGH (ref 4.0–10.5)
nRBC: 0 % (ref 0.0–0.2)

## 2021-02-02 LAB — AMMONIA: Ammonia: 24 umol/L (ref 9–35)

## 2021-02-02 LAB — GLUCOSE, CAPILLARY
Glucose-Capillary: 174 mg/dL — ABNORMAL HIGH (ref 70–99)
Glucose-Capillary: 176 mg/dL — ABNORMAL HIGH (ref 70–99)
Glucose-Capillary: 181 mg/dL — ABNORMAL HIGH (ref 70–99)
Glucose-Capillary: 185 mg/dL — ABNORMAL HIGH (ref 70–99)
Glucose-Capillary: 188 mg/dL — ABNORMAL HIGH (ref 70–99)
Glucose-Capillary: 211 mg/dL — ABNORMAL HIGH (ref 70–99)

## 2021-02-02 LAB — BASIC METABOLIC PANEL
Anion gap: 7 (ref 5–15)
BUN: 35 mg/dL — ABNORMAL HIGH (ref 8–23)
CO2: 31 mmol/L (ref 22–32)
Calcium: 9.5 mg/dL (ref 8.9–10.3)
Chloride: 107 mmol/L (ref 98–111)
Creatinine, Ser: 0.55 mg/dL (ref 0.44–1.00)
GFR, Estimated: >60 mL/min (ref >60)
Glucose, Bld: 212 mg/dL — ABNORMAL HIGH (ref 70–99)
Potassium: 3.4 mmol/L — ABNORMAL LOW (ref 3.5–5.1)
Sodium: 145 mmol/L (ref 135–145)

## 2021-02-02 LAB — MAGNESIUM: Magnesium: 1.5 mg/dL — ABNORMAL LOW (ref 1.7–2.4)

## 2021-02-02 LAB — PHOSPHORUS: Phosphorus: 3 mg/dL (ref 2.5–4.6)

## 2021-02-02 MED ORDER — AMITRIPTYLINE HCL 25 MG PO TABS
25.0000 mg | ORAL_TABLET | Freq: Every day | ORAL | Status: DC
  Administered 2021-02-02 – 2021-02-08 (×7): 25 mg
  Filled 2021-02-02 (×7): qty 1

## 2021-02-02 MED ORDER — FENTANYL 2500MCG IN NS 250ML (10MCG/ML) PREMIX INFUSION
0.0000 ug/h | INTRAVENOUS | Status: DC
  Administered 2021-02-02: 225 ug/h via INTRAVENOUS
  Administered 2021-02-03: 300 ug/h via INTRAVENOUS
  Filled 2021-02-02: qty 250

## 2021-02-02 MED ORDER — POTASSIUM CHLORIDE 20 MEQ PO PACK
40.0000 meq | PACK | Freq: Once | ORAL | Status: AC
  Administered 2021-02-02: 40 meq
  Filled 2021-02-02: qty 2

## 2021-02-02 MED ORDER — DEXTROSE 5 % IV SOLN
10.0000 mg/kg | Freq: Three times a day (TID) | INTRAVENOUS | Status: DC
  Administered 2021-02-02: 705 mg via INTRAVENOUS
  Filled 2021-02-02 (×2): qty 14.1

## 2021-02-02 MED ORDER — DEXMEDETOMIDINE HCL IN NACL 400 MCG/100ML IV SOLN
0.4000 ug/kg/h | INTRAVENOUS | Status: DC
  Administered 2021-02-02 (×2): 1.2 ug/kg/h via INTRAVENOUS
  Administered 2021-02-02: 1.1 ug/kg/h via INTRAVENOUS
  Administered 2021-02-03: 1 ug/kg/h via INTRAVENOUS
  Administered 2021-02-03: 0.8 ug/kg/h via INTRAVENOUS
  Administered 2021-02-03: 0.3 ug/kg/h via INTRAVENOUS
  Administered 2021-02-03: 1.2 ug/kg/h via INTRAVENOUS
  Administered 2021-02-03: 0.8 ug/kg/h via INTRAVENOUS
  Filled 2021-02-02 (×9): qty 100

## 2021-02-02 MED ORDER — ALBUMIN HUMAN 25 % IV SOLN
12.5000 g | Freq: Once | INTRAVENOUS | Status: AC
  Administered 2021-02-02: 12.5 g via INTRAVENOUS
  Filled 2021-02-02: qty 50

## 2021-02-02 MED ORDER — QUETIAPINE FUMARATE 100 MG PO TABS
100.0000 mg | ORAL_TABLET | Freq: Every day | ORAL | Status: DC
  Administered 2021-02-02 – 2021-02-08 (×7): 100 mg
  Filled 2021-02-02 (×7): qty 1

## 2021-02-02 MED ORDER — SODIUM CHLORIDE 0.9 % IV SOLN: INTRAVENOUS | Status: DC

## 2021-02-02 MED ORDER — SODIUM CHLORIDE 0.9% FLUSH: 10.0000 mL | INTRAVENOUS | Status: DC | PRN

## 2021-02-02 MED ORDER — FUROSEMIDE 10 MG/ML IJ SOLN
40.0000 mg | Freq: Once | INTRAMUSCULAR | Status: AC
Start: 2021-02-02 — End: 2021-02-02
  Administered 2021-02-02: 40 mg via INTRAVENOUS
  Filled 2021-02-02: qty 4

## 2021-02-02 MED ORDER — LIDOCAINE HCL 1 % IJ SOLN
INTRAMUSCULAR | Status: AC
  Filled 2021-02-02: qty 20

## 2021-02-02 MED ORDER — MAGNESIUM SULFATE 2 GM/50ML IV SOLN
2.0000 g | Freq: Once | INTRAVENOUS | Status: AC
  Administered 2021-02-02: 2 g via INTRAVENOUS
  Filled 2021-02-02: qty 50

## 2021-02-02 MED ORDER — LOSARTAN POTASSIUM 50 MG PO TABS
50.0000 mg | ORAL_TABLET | Freq: Every day | ORAL | Status: DC
  Administered 2021-02-02 – 2021-02-04 (×3): 50 mg
  Filled 2021-02-02 (×3): qty 1

## 2021-02-02 MED ORDER — SODIUM CHLORIDE 0.9 % IV SOLN
2.0000 g | INTRAVENOUS | Status: DC
  Administered 2021-02-02 – 2021-02-08 (×7): 2 g via INTRAVENOUS
  Filled 2021-02-02 (×8): qty 20

## 2021-02-02 MED ORDER — PHENYLEPHRINE HCL-NACL 20-0.9 MG/250ML-% IV SOLN
25.0000 ug/min | INTRAVENOUS | Status: DC
  Administered 2021-02-03 – 2021-02-04 (×5): 25 ug/min via INTRAVENOUS
  Administered 2021-02-05: 70 ug/min via INTRAVENOUS
  Administered 2021-02-05: 50 ug/min via INTRAVENOUS
  Administered 2021-02-05: 70 ug/min via INTRAVENOUS
  Administered 2021-02-06: 40 ug/min via INTRAVENOUS
  Administered 2021-02-06: 55 ug/min via INTRAVENOUS
  Administered 2021-02-06: 80 ug/min via INTRAVENOUS
  Administered 2021-02-06: 120 ug/min via INTRAVENOUS
  Administered 2021-02-07: 55 ug/min via INTRAVENOUS
  Filled 2021-02-02 (×14): qty 250

## 2021-02-02 MED ORDER — SODIUM CHLORIDE 0.9 % IV SOLN
250.0000 mL | INTRAVENOUS | Status: DC
  Administered 2021-02-02 – 2021-02-10 (×2): 250 mL via INTRAVENOUS

## 2021-02-02 MED ORDER — DEXAMETHASONE SODIUM PHOSPHATE 10 MG/ML IJ SOLN
6.0000 mg | Freq: Every day | INTRAMUSCULAR | Status: DC
  Administered 2021-02-02 – 2021-02-08 (×7): 6 mg via INTRAVENOUS
  Filled 2021-02-02 (×7): qty 1

## 2021-02-02 MED ORDER — ACYCLOVIR 200 MG/5ML PO SUSP
800.0000 mg | Freq: Every day | ORAL | Status: AC
  Administered 2021-02-02 – 2021-02-11 (×39): 800 mg
  Filled 2021-02-02 (×57): qty 20

## 2021-02-02 MED ORDER — SODIUM CHLORIDE 0.9 % IV SOLN: INTRAVENOUS | Status: AC

## 2021-02-02 MED ORDER — SODIUM CHLORIDE 0.9% FLUSH
10.0000 mL | Freq: Two times a day (BID) | INTRAVENOUS | Status: DC
  Administered 2021-02-02: 20 mL
  Administered 2021-02-02 – 2021-02-05 (×6): 10 mL

## 2021-02-02 NOTE — Plan of Care (Signed)

## 2021-02-02 NOTE — Progress Notes (Addendum)
NAME:  Rhonda Winters, MRN:  [id number], DOB:  Apr 28, 1945, LOS: 7 ADMISSION DATE:  01/25/2021, CONSULTATION DATE:  01/27/21 REFERRING MD:  Florene Glen, CHIEF COMPLAINT:  AMS   BRIEF  75 year old woman w/ hx of metabolic syndrome presenting with increasing confusion.  Workup has revealed RUL Pneumonia and pneumococcal bacteremia.  Unfortunately became more agitated through day, given ativan but then became obtunded with increased WOB.  Initial dose flumazenil ineffective.  Transferred to ICU and PCCM consulted.     Pertinent  Medical History  Hypothyroidism HTN Prior stroke T2DM  Significant Hospital Events: Including procedures, antibiotic start and stop dates in addition to other pertinent events   11/29 admitted 11/30 decompensation, intubation.  Attempted LP. 12/1 seizure-like activity, Ceribell in progress.  AED's initiated for seizure like movement.  IR LP. CT Head with no acute intracranial abnormalities, chronic small vessel disease, chronic R PCA territory infarct 12.2 - Afebrile . RN reports no seizure activity overnight.  Propofol turned off this am, remains on 50 mcg fentanyl. D5 turned off with hyperglycemia/ Pt waking, following simple commands intermittently, purposeful movement . Blood cultures with pan sensitive strep pneumo. Vent - 40-50% FiO2, PEEP 5 Glucose range 177-185 12/3 - given lasix yesterday -> then soft BP -> needed fluids. Had SVT ->on cardizem and resolved. On 50% fio2. On fent gtt/diprivan gtt (prior intolreance to precedex) -> agitated encephalopathy +. Afebrile 12/5 SBT done on prop/fent and failed 2/2 small TV, trial Precedex again.   Failed extubation->reintubated 12/6 No overnight events, calm on Precedex/Fentanyl  Interim History / Subjective:   Re-intubated yesterday afternoon No acute events UOP 2.8L yesterday after Lasix 40mg , remains 4L + since admission  Objective   Blood pressure (!) 160/65, pulse 61, temperature 99 F (37.2 C), temperature  source Axillary, resp. rate (!) 26, height 5\' 2"  (1.575 m), weight 100.5 kg, SpO2 95 %.    Vent Mode: PRVC FiO2 (%):  [40 %-50 %] 40 % Set Rate:  [26 bmp] 26 bmp Vt Set:  [400 mL] 400 mL PEEP:  [5 cmH20] 5 cmH20 Plateau Pressure:  [17 cmH20-20 cmH20] 19 cmH20   Intake/Output Summary (Last 24 hours) at 02/02/2021 0915 Last data filed at 02/02/2021 0600 Gross per 24 hour  Intake 1576.59 ml  Output 2520 ml  Net -943.41 ml    Filed Weights   01/30/21 0417 01/31/21 0500 02/02/21 0500  Weight: 90.9 kg 100.9 kg 100.5 kg    General:  intubated and sedated critically ill F, calm without acute distress HEENT: MM pink/dry, ETT in place, pupils equal, sclera anicteric  Neuro: examined on precedex/fent, opens eyes to voice, but not tracking and not following commands, moving all extremities CV: s1s2 rrr, no m/r/g PULM:  on full vent support, mechanical breath sounds bilaterally without rhonchi or wheezing GI: soft, bsx4 active  Extremities: warm/dry, no edema  Skin: no rashes or lesions    Resolved Hospital Problem list    AKI - resolved  Assessment & Plan:   Acute Hypoxemic Respiratory Failure secondary to Pneumococcal PNA (POA) due to Pneumooccal pneumonia Intubated 11/30, difficulty progressing towards extubation secondary to agitation Attempted extubation 12/6, however patient had some stridor and weak cough and required re-intubation Discussed possible trach with patient's husband, he does not think she would want prolonged life support, however he describes her as highly functioning prior to this illness and would want to proceed with trach at this time Plan -attempt SAT/SBT again today, hopefully can attempt extubation again in  the next several days.  -trial resuming home Elavil, start seroquel, hold Haldol and monitor Qtc -Start Decadron 6mg  qd for upper airway edema -seems calmer today on Precedex/Fentanyl -continue Ceftriaxone for pneumococcal PNA, 14 day course, day 8  today -Maintain full vent support with SAT/SBT as tolerated -titrate Vent setting to maintain SpO2 greater than or equal to 90%. -HOB elevated 30 degrees. -Plateau pressures less than 30 cm H20.  -Follow chest x-ray, ABG prn.   -Bronchial hygiene and RT/bronchodilator protocol.   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  Repeated on 12/1 negative as well. -d/c Keppra -prn Ativan ordered   Hypophosphatemia, Hypomagnesemia  , Hypokalemia -replete as needed  Anemia  -stable, no signs of bleeding -transfuse for hgb <7   Urinary incontience -follow I/O  DM II with Hyperglycemia  -continue SSI   At Risk Malnutrition  -continue TF  -appreciate Nutrition  -continue folate, thiamine    Best Practice (right click and "Reselect all SmartList Selections" daily)  Diet/type: TF DVT prophylaxis: LMWH  GI prophylaxis: PPI Lines: No longer needed.  Order written to d/c  Foley:  Yes, and it is still needed Code Status:  full code Last date of multidisciplinary goals of care discussion: 12/1   Pending family update 12/5   [facility]   CRITICAL CARE Performed by: Otilio Carpen Chelsee Hosie   Total critical care time: 38 minutes  Critical care time was exclusive of separately billable procedures and treating other patients.  Critical care was necessary to treat or prevent imminent or life-threatening deterioration.  Critical care was time spent personally by me on the following activities: development of  treatment plan with patient and/or surrogate as well as nursing, discussions with consultants, evaluation of patient's response to treatment, examination of patient, obtaining history from patient or surrogate, ordering and performing treatments and interventions, ordering and review of laboratory studies, ordering and review of radiographic studies, pulse oximetry and re-evaluation of patient's condition.   Otilio Carpen Quantarius Genrich, PA-C [facility] See Amion for pager If no response to pager , please call [phone] until 7pm After 7:00 pm call Elink  [phone]?[place]  Lab 01/27/21 1233 01/27/21 1428  PHART 7.296* 7.354  PCO2ART 48.5* 43.3  PO2ART 65.7* 194*  HCO3 23.0 23.5  O2SAT 88.1 99.5     CBC Recent Labs  Lab 01/31/21 0530 02/01/21 0323 02/02/21 0510  HGB 9.1* 8.1* 8.5*  HCT 29.8* 27.2* 27.5*  WBC 12.9* 10.1 10.7*  PLT 207 199 203     COAGULATION No results for input(s): INR in the last 168 hours.   CARDIAC  No results for input(s): TROPONINI in the last 168 hours. No results for input(s): PROBNP in the last 168 hours.   CHEMISTRY Recent Labs  Lab 01/29/21 1652 01/30/21 0337 01/30/21 2055 01/31/21 0530 02/01/21 0323 02/02/21 0510  NA 149* 149*  --  147* 147* 145  K 2.9* 3.2*  --  4.1 4.0 3.4*  CL 109 116*  --  113* 111 107  CO2 29 27  --  31 31 31   GLUCOSE 184* 186*  --  200* 199* 212*  BUN 32* 31*  --  35* 34* 35*  CREATININE 0.72 0.61  --  0.63 0.69 0.55  CALCIUM 10.1 8.0*  --  9.5 9.5 9.5  MG 1.5* 1.3* 2.3 2.1 2.0 1.5*  PHOS 4.9* 3.0  --  3.0 3.0 3.0    Estimated Creatinine Clearance: 67.4 mL/min (by C-G formula based on SCr of 0.55 mg/dL).   LIVER Recent Labs  Lab 01/28/21 0240  AST 23  ALT 28  ALKPHOS 75  BILITOT 0.8  PROT 6.4*  ALBUMIN 2.8*      INFECTIOUS Recent Labs  Lab 01/31/21 0937  LATICACIDVEN 1.1      ENDOCRINE CBG (last 3)  Recent Labs    02/01/21 2339  02/02/21 0335 02/02/21 0819  GLUCAP 170* 174* 181*          IMAGING x48h  - image(s) personally visualized  -   highlighted in bold DG Abd 1 View  Result Date: 02/01/2021 CLINICAL DATA:  Orogastric tube placement EXAM: ABDOMEN - 1 VIEW COMPARISON:  01/27/2021 abdomen radiograph, 02/01/2021 chest radiograph FINDINGS: Gastric tube tip and side port are below the diaphragm and overlying the expected location of the stomach. The bowel gas pattern is normal. No radio-opaque calculi or other significant radiographic abnormality are seen in the imaged portions of the abdomen. For findings above the diaphragm, please see same-day chest radiograph. IMPRESSION: Gastric tube in appropriate position. Electronically Signed   By: Merilyn Baba M.D.   On: 02/01/2021 14:26   DG CHEST PORT 1 VIEW  Result Date: 02/01/2021 CLINICAL DATA:  Encounter for intubation EXAM: PORTABLE CHEST 1 VIEW COMPARISON:  02/01/2021 FINDINGS: Endotracheal tube approximately 2 cm from the carina. NG tube in stomach. Central venous line with tip in distal SVC. Focus of segmental airspace disease in the RIGHT upper lobe. Low lung volumes. IMPRESSION: 1. Endotracheal tube and central venous line appear in good position. 2. RIGHT upper lobe airspace disease suggests pneumonia. Electronically Signed   By: Suzy Bouchard M.D.   On: 02/01/2021 14:25   DG CHEST PORT 1 VIEW  Result Date: 02/01/2021 CLINICAL DATA:  Pneumonia EXAM: PORTABLE CHEST 1 VIEW COMPARISON:  Previous studies including the examination of 01/30/2021 FINDINGS: Tip of endotracheal tube is 4.1 cm above the carina. Enteric tube is noted in place. Tip of central venous catheter is seen in the right atrium. There is an infiltrate in the right upper lung fields with possible slight worsening. Increased markings are seen in the right parahilar region. There are no signs of alveolar pulmonary edema. Right lateral CP angle is indistinct. There is no pneumothorax. IMPRESSION:  There is an infiltrate in the right upper lung fields suggesting pneumonia with possible worsening. Possible small right pleural effusion. Electronically Signed   By: Elmer Picker M.D.   On: 02/01/2021 08:25   DG Abd Portable 1V  Result Date: 02/01/2021 CLINICAL DATA:  Enteric tube placement. EXAM: PORTABLE ABDOMEN - 1 VIEW COMPARISON:  Abdominal x-ray from same day. FINDINGS: Slight interval advancement of the enteric tube with the tip still in the distal stomach. The bowel gas pattern is normal. No radio-opaque calculi or other significant radiographic abnormality are seen. IMPRESSION: 1. Slight interval advancement of the enteric tube with the tip still in the distal stomach. Electronically Signed   By: Titus Dubin M.D.   On: 02/01/2021 18:01

## 2021-02-02 NOTE — Progress Notes (Signed)
  Interval progress note:   During routine care pt noted to have new vesicular lesions in the sacral area.   Possibly herpes zoster in the setting of acute infection.  Will place on precautions and discuss with ID.        Otilio Carpen Simra Fiebig, PA-C [facility] See Amion for pager If no response to pager , please call [phone] until 7pm After 7:00 pm call Elink  [phone]?[place]

## 2021-02-02 NOTE — Progress Notes (Signed)
Consult initiated for potential start of pressor. RN notified of existing 20gX1.88 started by I.V. team earlier today which she confirms is working. I watch will be placed at that time.

## 2021-02-02 NOTE — Progress Notes (Addendum)
Nutrition Follow-up  DOCUMENTATION CODES:   Obesity unspecified  INTERVENTION:  - continue Vital High Protein @ 50 ml/hr with 45 ml Prosource TF BID. - free water flush, if desired, to be per CCM.    NUTRITION DIAGNOSIS:   Inadequate oral intake related to inability to eat as evidenced by NPO status. -ongoing  GOAL:   Provide needs based on ASPEN/SCCM guidelines -met with TF regimen  MONITOR:   Vent status, TF tolerance, Labs, Weight trends   ASSESSMENT:   75 y.o. female with medical history of stroke in 06/2020, DM, HTN, HLD, and hypothyroidism. She was taken to the ED after being found to be increasingly confused x3 days after experiencing a fall.  Significant Events: 11/30- intubation; OGT placement 12/1- initial RD assessment (remote) 12/5- extubation; OGT removed; re-intubation; OGT replacement   Patient was re-intubated yesterday at ~1350. OGT placed at ~1600 (distal stomach).  Patient was placed on airborne/contact precautions a short time ago d/t concern for shingles.   Order in place for Vital High Protein @ 50 ml/hr with 45 ml Prosource TF BID. This regimen provides 1280 kcal, 127 grams protein, and 1003 ml free water.   TF not currently running. RN reports need to hold TF for 4 hours for liver ultrasound, but was running at goal until 1 hour ago without issue.   Weight today is stable from 12/4, but is +21 lb compared to weight on 12/3 and +22 lb compared to weight on 12/2. She is noted to be +4.4 L since admission.   Per notes: - severe sepsis--improving with patient now off pressors - at risk for malnutrition--folate and thiamine - plan for checking ammonia and RUQ ultrasound to assess for cirrhosis   Patient is currently intubated on ventilator support MV: 9.9 L/min Temp (24hrs), Avg:98.6 F (37 C), Min:98.1 F (36.7 C), Max:99.1 F (37.3 C) Propofol: none  Labs reviewed; CBGs: 174, 181, 188 mg/dl, K: 3.4 mmol/l, BUN: 35 mg/dl, Mg: 1.5  mg/dl.  Medications reviewed; 100 mg colace BID, 1 mg folvite/day, sliding scale novolog, 125 mcg synthroid per OGT/day, 2 g IV Mg sulfate x1 run 12/6, 3 mg melatonin per OGT/night, 15 ml multivitamin/day, 40 mg IV protonix/day, 17 g miralax/day, 40 mEq Klor-Con x1 dose 12/6, 100 mg thiamine/day.   Drips; precedex @ 1.2 mcg/kg/hr, fentanyl @ 75 mcg/hr.   NUTRITION - FOCUSED PHYSICAL EXAM:  Completed; no muscle or fat depletions, mild pitting edema to BLE.  Diet Order:   Diet Order             Diet NPO time specified  Diet effective now                   EDUCATION NEEDS:   No education needs have been identified at this time  Skin:  Skin Assessment: Reviewed RN Assessment  Last BM:  12/5 (type 6 x2, both medium amount)  Height:   Ht Readings from Last 1 Encounters:  01/25/21 5' 2"  (1.575 m)    Weight:   Wt Readings from Last 1 Encounters:  02/02/21 100.5 kg     Estimated Nutritional Needs:  Kcal:  0370-4888 kcal Protein:  >/= 115 grams Fluid:  >/= 1.8 L/day    Jarome Matin, MS, RD, LDN, CNSC Inpatient Clinical Dietitian RD pager # available in AMION  After hours/weekend pager # available in [facility]

## 2021-02-02 NOTE — Progress Notes (Addendum)
Pharmacy Antibiotic Note  Rhonda Winters is a 75 y.o. female admitted on 01/25/2021 with pneumonia.  Now with new vesicular lesions in the sacral area- concern for herpes zoster in the setting of acute infection.  Pharmacy has been consulted for IV acyclovir dosing.  Plan: Acyclovir 28m/kg IV q8 hours (7096musing ABW) D/c KVO normal saline fluids Start normal saline at 125 mL/hr Continue ceftriaxone 2g IV q 24 hours per MD Monitor renal function, clinical improvement, ID recommendations  Height: _0  (157.5 cm) Weight: 100.5 kg (221 lb 9 oz) IBW/kg (Calculated) : 50.1  Temp (24hrs), Avg:98.6 F (37 C), Min:98.1 F (36.7 C), Max:99.1 F (37.3 C)  Recent Labs  Lab 01/29/21 0411 01/29/21 1652 01/30/21 0337 01/31/21 0530 01/31/21 0937 02/01/21 0323 02/02/21 0510  WBC 9.6  --  10.7* 12.9*  --  10.1 10.7*  CREATININE 0.73 0.72 0.61 0.63  --  0.69 0.55  LATICACIDVEN  --   --   --   --  1.1  --   --     Estimated Creatinine Clearance: 67.4 mL/min (by C-G formula based on SCr of 0.55 mg/dL).    Allergies  Allergen Reactions   Hydrocodone-Acetaminophen Nausea And Vomiting    Antimicrobials this admission: Ceftriaxone 11/28 >>  Acyclovir 12/6 >>   Dose adjustments this admission:   Microbiology results: 11/28 BCx: BCID 11/29 in 4/4 bottles  Strep pneumo (pans sens) FINAL 11/30 BAL: neg FINAL 11/29 Strep pneumo Ag urine: neg 11/29 Legionella: neg 12/1 CSF: neg FINAL  Thank you for allowing pharmacy to be a part of this patient's care.  MaDimple NanasPharmD 02/02/2021 2:00 PM   ADDENDUM: - Discussed with CCM PA - patient with these vesicles in the past - Will treat with Acyclovir 80066mer tube 5 times daily for shingles treatment - [facility] 02/02/2021 3:19 PM

## 2021-02-02 NOTE — Progress Notes (Signed)
[facility] Progress Note Patient Name: KEAGAN ANTHIS DOB: 01-25-1946 MRN: [id number]   Date of Service  02/02/2021  HPI/Events of Note  Multiple issues: 1. Hypotension = BP = 84/43 with MAP = 54. LVEF = 65-70%. Albumin = 2.8. 2. Sedation not adequate on Precedex IV infusion at 1.2 mcg/kg/hour and Fentanyl IV infusion at 200 mcg/hour.   eICU Interventions  Plan: 25% Albumin 12.5 gm IV now.  Titrate Fentanyl IV infusion up as already written.  Phenylephrine IV infusion via PIV. Titrate to MAP >= 65.        Tre Sanker Cornelia Copa 02/02/2021, 10:38 PM

## 2021-02-03 ENCOUNTER — Inpatient Hospital Stay (HOSPITAL_COMMUNITY)
Admit: 2021-02-03 | Discharge: 2021-02-03 | Disposition: A | Discharge status: Home / Self Care | Payer: Medicare Other | Attending: Neurology | Admitting: Neurology

## 2021-02-03 DIAGNOSIS — G9340 Encephalopathy, unspecified: Secondary | ICD-10-CM | POA: Diagnosis not present

## 2021-02-03 DIAGNOSIS — J9601 Acute respiratory failure with hypoxia: Secondary | ICD-10-CM | POA: Diagnosis not present

## 2021-02-03 DIAGNOSIS — A419 Sepsis, unspecified organism: Secondary | ICD-10-CM | POA: Diagnosis not present

## 2021-02-03 LAB — COMPREHENSIVE METABOLIC PANEL
ALT: 14 U/L (ref 0–44)
AST: 23 U/L (ref 15–41)
Albumin: 2.2 g/dL — ABNORMAL LOW (ref 3.5–5.0)
Alkaline Phosphatase: 48 U/L (ref 38–126)
Anion gap: 7 (ref 5–15)
BUN: 40 mg/dL — ABNORMAL HIGH (ref 8–23)
CO2: 23 mmol/L (ref 22–32)
Calcium: 7.8 mg/dL — ABNORMAL LOW (ref 8.9–10.3)
Chloride: 116 mmol/L — ABNORMAL HIGH (ref 98–111)
Creatinine, Ser: 0.73 mg/dL (ref 0.44–1.00)
GFR, Estimated: >60 mL/min (ref >60)
Glucose, Bld: 197 mg/dL — ABNORMAL HIGH (ref 70–99)
Potassium: 4.8 mmol/L (ref 3.5–5.1)
Sodium: 146 mmol/L — ABNORMAL HIGH (ref 135–145)
Total Bilirubin: 2.2 mg/dL — ABNORMAL HIGH (ref 0.3–1.2)
Total Protein: 5.6 g/dL — ABNORMAL LOW (ref 6.5–8.1)

## 2021-02-03 LAB — BLOOD GAS, ARTERIAL
Acid-Base Excess: 5.1 mmol/L — ABNORMAL HIGH (ref 0.0–2.0)
Bicarbonate: 30.5 mmol/L — ABNORMAL HIGH (ref 20.0–28.0)
Drawn by: 560031
FIO2: 40
MECHVT: 400 mL
O2 Saturation: 92.7 %
PEEP: 5 cmH2O
Patient temperature: 98.6
RATE: 26 resp/min
pCO2 arterial: 51.2 mmHg — ABNORMAL HIGH (ref 32.0–48.0)
pH, Arterial: 7.393 (ref 7.350–7.450)
pO2, Arterial: 71.7 mmHg — ABNORMAL LOW (ref 83.0–108.0)

## 2021-02-03 LAB — GLUCOSE, CAPILLARY
Glucose-Capillary: 102 mg/dL — ABNORMAL HIGH (ref 70–99)
Glucose-Capillary: 165 mg/dL — ABNORMAL HIGH (ref 70–99)
Glucose-Capillary: 152 mg/dL — ABNORMAL HIGH (ref 70–99)
Glucose-Capillary: 196 mg/dL — ABNORMAL HIGH (ref 70–99)
Glucose-Capillary: 186 mg/dL — ABNORMAL HIGH (ref 70–99)
Glucose-Capillary: 181 mg/dL — ABNORMAL HIGH (ref 70–99)

## 2021-02-03 LAB — VITAMIN B12: Vitamin B-12: 617 pg/mL (ref 180–914)

## 2021-02-03 LAB — FOLATE: Folate: 12.5 ng/mL (ref >5.9)

## 2021-02-03 LAB — MAGNESIUM: Magnesium: 1.9 mg/dL (ref 1.7–2.4)

## 2021-02-03 LAB — PHOSPHORUS: Phosphorus: 3.8 mg/dL (ref 2.5–4.6)

## 2021-02-03 LAB — TSH: TSH: 0.799 u[IU]/mL (ref 0.350–4.500)

## 2021-02-03 MED ORDER — THIAMINE HCL 100 MG/ML IJ SOLN
500.0000 mg | Freq: Three times a day (TID) | INTRAVENOUS | Status: AC
  Administered 2021-02-03 – 2021-02-05 (×6): 500 mg via INTRAVENOUS
  Filled 2021-02-03 (×7): qty 5

## 2021-02-03 MED ORDER — FUROSEMIDE 10 MG/ML IJ SOLN
40.0000 mg | Freq: Every day | INTRAMUSCULAR | Status: DC
  Administered 2021-02-03 – 2021-02-04 (×2): 40 mg via INTRAVENOUS
  Filled 2021-02-03 (×2): qty 4

## 2021-02-03 MED ORDER — THIAMINE HCL 100 MG/ML IJ SOLN
250.0000 mg | Freq: Every day | INTRAVENOUS | Status: AC
  Administered 2021-02-06 – 2021-02-11 (×6): 250 mg via INTRAVENOUS
  Filled 2021-02-03 (×7): qty 2.5

## 2021-02-03 MED ORDER — HYDROMORPHONE BOLUS VIA INFUSION
0.2500 mg | INTRAVENOUS | Status: DC | PRN
  Administered 2021-02-03 (×6): 1 mg via INTRAVENOUS
  Administered 2021-02-03: 0.5 mg via INTRAVENOUS
  Administered 2021-02-03 – 2021-02-04 (×3): 1 mg via INTRAVENOUS
  Administered 2021-02-05: 0.5 mg via INTRAVENOUS
  Filled 2021-02-03: qty 1

## 2021-02-03 MED ORDER — HYDROMORPHONE HCL 1 MG/ML IJ SOLN
0.5000 mg | Freq: Once | INTRAMUSCULAR | Status: AC
  Administered 2021-02-03: 0.5 mg via INTRAVENOUS
  Filled 2021-02-03: qty 1

## 2021-02-03 MED ORDER — THIAMINE HCL 100 MG/ML IJ SOLN
100.0000 mg | Freq: Every day | INTRAMUSCULAR | Status: DC
  Administered 2021-02-12: 100 mg via INTRAVENOUS
  Filled 2021-02-03: qty 2

## 2021-02-03 MED ORDER — MIDAZOLAM HCL 2 MG/2ML IJ SOLN: 2.0000 mg | Freq: Once | INTRAMUSCULAR | Status: DC

## 2021-02-03 MED ORDER — SODIUM CHLORIDE 0.9 % IV SOLN
0.5000 mg/h | INTRAVENOUS | Status: DC
  Administered 2021-02-03: 3.5 mg/h via INTRAVENOUS
  Administered 2021-02-03: 0.5 mg/h via INTRAVENOUS
  Administered 2021-02-04 – 2021-02-05 (×2): 3.25 mg/h via INTRAVENOUS
  Administered 2021-02-05: 2.75 mg/h via INTRAVENOUS
  Administered 2021-02-07: 1.75 mg/h via INTRAVENOUS
  Filled 2021-02-03 (×9): qty 5

## 2021-02-03 MED ORDER — PROPOFOL 1000 MG/100ML IV EMUL
5.0000 ug/kg/min | INTRAVENOUS | Status: DC
  Administered 2021-02-03: 5 ug/kg/min via INTRAVENOUS
  Administered 2021-02-03: 30 ug/kg/min via INTRAVENOUS
  Administered 2021-02-04 (×4): 40 ug/kg/min via INTRAVENOUS
  Administered 2021-02-04: 45 ug/kg/min via INTRAVENOUS
  Administered 2021-02-05: 20 ug/kg/min via INTRAVENOUS
  Administered 2021-02-05: 35 ug/kg/min via INTRAVENOUS
  Administered 2021-02-05: 40 ug/kg/min via INTRAVENOUS
  Administered 2021-02-06: 20 ug/kg/min via INTRAVENOUS
  Administered 2021-02-07 (×2): 25 ug/kg/min via INTRAVENOUS
  Administered 2021-02-07: 10 ug/kg/min via INTRAVENOUS
  Administered 2021-02-08: 20 ug/kg/min via INTRAVENOUS
  Filled 2021-02-03 (×18): qty 100

## 2021-02-03 NOTE — Consult Note (Addendum)
NEUROLOGY CONSULTATION NOTE   Date of service: February 03, 2021 Patient Name: KENDAHL Winters MRN:  [id number] DOB:  May 26, 1945 Reason for consult: "prolonged encephalopathy, unable to extubated" Requesting Provider: Juanito Doom, MD _ _ _   _ __   _ __ _ _  __ __   _ __   __ _  History of Present Illness  Rhonda Winters is a 75 y.o. female with PMH significant for recent stroke, DM2, HTN, HLD, hypothyroidism who presented to [facility] with confusion x 3 days and agitation. She was found to have strep Pneumo Pneumonia. She was transferred to ICU for agitation but somnolence wihen given ativan. She was intubated. Rapid Ceribell EEG with moderate to severe enecephalopathy. She underwent LP with fluoro with no pleocytosis, CSF protein and glucose were not obtained. She had MRI Brain w + w/o contrast which was notable for chronic infarcts and a 1cm meningioma with no adjacent brain edema but no acute intracranial abnormalities.  She was able to be extubated on 12/5 briefly before having to be reintubated for agitation/stridor. She now would open her eyes, but does not follow commands and unable to extubate. We were asked to evaluate for any other potential causes of encephalopathy.  Of note, she has a viral exanthem in her low back as below. Has had this occur a few times and treated by family with topical acyclovir.       ROS   Unable to provide any ROS secondary to intubation and sedation.  Past History   Past Medical History:  Diagnosis Date   Acquired hypothyroidism    Hypertension    Stroke ([place])    Type 2 diabetes mellitus ([place])    Past Surgical History:  Procedure Laterality Date   LOOP RECORDER INSERTION N/A 07/01/2020   Procedure: LOOP RECORDER INSERTION;  Surgeon: Constance Haw, MD;  Location: [facility];  Service: Cardiovascular;  Laterality: N/A;   History reviewed. No pertinent family history. Social History   Socioeconomic History   Marital  status: Married    Spouse name: Not on file   Number of children: Not on file   Years of education: Not on file   Highest education level: Not on file  Occupational History   Not on file  Tobacco Use   Smoking status: Former    Types: Cigarettes    Quit date: 09/17/1988    Years since quitting: 32.4   Smokeless tobacco: Never  Substance and Sexual Activity   Alcohol use: Yes   Drug use: Never   Sexual activity: Not on file  Other Topics Concern   Not on file  Social History Narrative   Not on file   Social Determinants of Health   Financial Resource Strain: Not on file  Food Insecurity: Not on file  Transportation Needs: Not on file  Physical Activity: Not on file  Stress: Not on file  Social Connections: Not on file   Allergies  Allergen Reactions   Hydrocodone-Acetaminophen Nausea And Vomiting    Medications   Medications Prior to Admission  Medication Sig Dispense Refill Last Dose   acetaminophen (TYLENOL) 500 MG tablet Take 1,000 mg by mouth every 6 (six) hours as needed for moderate pain.   01/25/2021 at 1400   amitriptyline (ELAVIL) 25 MG tablet Take 25 mg by mouth at bedtime.   01/24/2021   ascorbic acid (VITAMIN C) 500 MG tablet Take 1 tablet (500 mg total) by mouth daily. [place]  tablet 0 01/25/2021   aspirin 325 MG tablet Take 1 tablet (325 mg total) by mouth daily. 30 tablet 0 01/25/2021   atenolol (TENORMIN) 50 MG tablet Take 50 mg by mouth daily.   01/25/2021 at 0930   BIOTIN PO Take 1 tablet by mouth daily.   01/25/2021   cetirizine (ZYRTEC) 10 MG tablet Take 10 mg by mouth daily as needed for allergies.   Past Week   diclofenac (VOLTAREN) 75 MG EC tablet Take 75 mg by mouth 2 (two) times daily.   01/25/2021   DULoxetine (CYMBALTA) 60 MG capsule Take 60 mg by mouth daily.   01/25/2021   gabapentin (NEURONTIN) 800 MG tablet Take 800 mg by mouth 3 (three) times daily.   01/25/2021 at 1400   hydrochlorothiazide (HYDRODIURIL) 50 MG tablet Take 50 mg by mouth  daily.   01/25/2021 at am   levothyroxine (SYNTHROID) 125 MCG tablet Take 125 mcg by mouth daily before breakfast.   01/25/2021   losartan (COZAAR) 50 MG tablet Take 1 tablet (50 mg total) by mouth daily. 30 tablet 0 01/25/2021   metFORMIN (GLUCOPHAGE-XR) 500 MG 24 hr tablet Take 500-1,000 mg by mouth See admin instructions.   01/22/2021   vitamin E 180 MG (400 UNITS) capsule Take 400 Units by mouth daily.   01/25/2021   atorvastatin (LIPITOR) 80 MG tablet Take 1 tablet (80 mg total) by mouth daily. (Patient not taking: Reported on 01/26/2021) 30 tablet 0 Not Taking   furosemide (LASIX) 20 MG tablet Take 20 mg by mouth daily as needed for fluid.  (Patient not taking: Reported on 01/26/2021)   Not Taking     Vitals   Vitals:   02/03/21 1600 02/03/21 1647 02/03/21 1700 02/03/21 2000  BP: (!) 156/68  (!) 169/120   Pulse: (!) 56  (!) 56   Resp: (!) 26  (!) 26   Temp:    97.9 F (36.6 C)  TempSrc:      SpO2: 93%  97%   Weight:      Height:  5\' 2"  (1.575 m)       Body mass index is 40.12 kg/m.  Physical Exam   General: Laying in bed; intubated. Does not appear in significant distress. HENT: Normal oropharynx and mucosa. Normal external appearance of ears and nose. Neck: Supple, no pain or tenderness CV: No JVD. No peripheral edema. Pulmonary: Symmetric Chest rise. Breathing over vent. Abdomen: Soft to touch, non-tender. Ext: No cyanosis, edema, or deformity Skin: No rash. Normal palpation of skin.  Musculoskeletal: Normal digits and nails by inspection. No clubbing.  Neurologic Examination  Mental status/Cognition: eyes open. Does not track or make eye contact. Blinks to threat BL. No speech. No attempt to communicate.  Brainstem/Cranial nerves:   CN II Pupils equal and reactive to light, blinks to threat BL   CN III,IV,VI EOM intact to dolls eyes. No nystagmus.   CN V Corneals intact BL, closes eyes tight shut when assessing corneals.   CN VII Symmetric facial grimace.   CN  VIII    CN IX & X Cough intact. Gag intact.   CN XI    CN XII    Motor/sensory:  Muscle bulk: poor, tone normal Spontaneously moves all extremities. Localizes in BL upper extremities. Withdraws in BL lower extremities.  Reflexes:  Right Left Comments  Pectoralis      Biceps (C5/6) 2+ 2+   Brachioradialis (C5/6) 2 2    Triceps (C6/7) 2 2  Patellar (L3/4) 3 3    Achilles (S1) 2 2    Hoffman + +    Plantar equivocal equivocal   Jaw jerk     Coordination/Complex Motor:  - Unable to assess. - Gait: unsafe to assess.  Labs   CBC:  Recent Labs  Lab 02/01/21 0323 02/02/21 0510  WBC 10.1 10.7*  HGB 8.1* 8.5*  HCT 27.2* 27.5*  MCV 106.7* 104.2*  PLT 199 852    Basic Metabolic Panel:  Lab Results  Component Value Date   NA 146 (H) 02/03/2021   K 4.8 02/03/2021   CO2 23 02/03/2021   GLUCOSE 197 (H) 02/03/2021   BUN 40 (H) 02/03/2021   CREATININE 0.73 02/03/2021   CALCIUM 7.8 (L) 02/03/2021   GFRNONAA >60 02/03/2021   Lipid Panel:  Lab Results  Component Value Date   LDLCALC 203 (H) 06/30/2020   HgbA1c:  Lab Results  Component Value Date   HGBA1C 5.8 (H) 06/29/2020   Urine Drug Screen:     Component Value Date/Time   LABOPIA NONE DETECTED 06/29/2020 1930   COCAINSCRNUR NONE DETECTED 06/29/2020 1930   LABBENZ NONE DETECTED 06/29/2020 1930   AMPHETMU NONE DETECTED 06/29/2020 1930   THCU NONE DETECTED 06/29/2020 1930   LABBARB NONE DETECTED 06/29/2020 1930    Alcohol Level     Component Value Date/Time   ETH <10 06/29/2020 1633    MRI Brain w + w/o C (Personally reviewed): Negative for acute infarct. 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 Incidental finding.  rEEG:  This study is suggestive of moderate to severe diffuse encephalopathy, nonspecific etiology. No seizures or definite epileptiform discharges were seen throughout the  recording.  Impression   Rhonda Winters is a 75 y.o. female with PMH significant for with PMH significant for recent stroke, DM2, HTN, HLD, hypothyroidism who presented to [facility] with confusion x 3 days and agitation. She was found to have strep Pneumo Pneumonia. Was intubated and transferred to ICU for agitation and inability to tolerate Bipap. She has been significantly encephalopathic in the ICU with eyes open but not following commands, does not make eye contact, no speech or attempts to communicate, does have some spontaneous movements in all extremities.    Workup so far for encephalopathy with no significant abnormalities on MRI Brain, partial csf studies with no pleocytosis, routine EEG with moderate to severe encephalopathy and no epileptiform discharges, no seizures.  She has had a pretty extensive workup and most of the causes of encephalopathy have been evaluated already. Among some of the other causes for encephalopathy, could have Vitamin B12 or folate deficiency specially given elevated MCV on CBC. Thiamine is being replaced but I put her on Wernicke's dose thiamine replacement. Unlikely history for carbon-monoxide poisoning. Ammonia is normal, she is not cirrhotic. She could also be catatonic.  I would also recommend repeating her MRI Brain with and without contrast. Seems like the MRI was obtained about a week ago and her encephalopathy has worsened over the last couple of days. I think repeat MRI would be helpful specially with the conern for potential HSV/VZV with the noted viral exanthem.  Recommendations  - Repeat MRI Brain with and without contrast - serum TSH, Vit B12, MMA, folate - routine EEG obtained today with no epileptiform discharges. - Might consider getting Psych team's input on possible catatonia. - HSV and VZV PCR on serum is  pending, agree with continuing Acyclovir until results are back. - We will continue to  follow. ______________________________________________________________________   Thank you for the opportunity to take part in the care of this patient. If you have any further questions, please contact the neurology consultation attending.  Signed,  [place] Pager Number [phone] _ _ _   _ __   _ __ _ _  __ __   _ __   __ _

## 2021-02-03 NOTE — Progress Notes (Signed)
EEG complete - results pending 

## 2021-02-03 NOTE — Progress Notes (Signed)
NAME:  Rhonda Winters, MRN:  [id number], DOB:  March 22, 1945, LOS: 8 ADMISSION DATE:  01/25/2021, CONSULTATION DATE:  01/27/21 REFERRING MD:  Florene Glen, CHIEF COMPLAINT:  AMS   BRIEF  75 year old woman w/ hx of metabolic syndrome presenting with increasing confusion.  Workup has revealed RUL Pneumonia and pneumococcal bacteremia.  Unfortunately became more agitated through day, given ativan but then became obtunded with increased WOB.  Initial dose flumazenil ineffective.  Transferred to ICU and PCCM consulted.     Pertinent  Medical History  Hypothyroidism HTN Prior stroke T2DM  Significant Hospital Events: Including procedures, antibiotic start and stop dates in addition to other pertinent events   11/29 admitted 11/30 decompensation, intubation.  Attempted LP. 12/1 seizure-like activity, Ceribell in progress.  AED's initiated for seizure like movement.  IR LP. CT Head with no acute intracranial abnormalities, chronic small vessel disease, chronic R PCA territory infarct 12.2 - Afebrile . RN reports no seizure activity overnight.  Propofol turned off this am, remains on 50 mcg fentanyl. D5 turned off with hyperglycemia/ Pt waking, following simple commands intermittently, purposeful movement . Blood cultures with pan sensitive strep pneumo. Vent - 40-50% FiO2, PEEP 5 Glucose range 177-185 12/3 - given lasix yesterday -> then soft BP -> needed fluids. Had SVT ->on cardizem and resolved. On 50% fio2. On fent gtt/diprivan gtt (prior intolreance to precedex) -> agitated encephalopathy +. Afebrile 12/5 SBT done on prop/fent and failed 2/2 small TV, trial Precedex again.   Failed extubation->reintubated 12/6 No overnight events, calm on Precedex/Fentanyl 12/7 Diuresed well, overnight hypotension with transient pressors resolved this AM, no improvement in encephalopathy, vesicular lesions found on sacrum yesterday, viral cultures pending  Interim History / Subjective:  Pt's husband reported that she  routinely gets vesicular lesions of the sacral area and treats with acyclovir, he thinks these are HSV, though if cultures done thinks was >25yrs ago.  Pt remains persistently encephalopathic  Weaning 15/5 this AM Diuresed ~2L yesterday  Objective   Blood pressure (!) 201/90, pulse (!) 55, temperature 97.8 F (36.6 C), temperature source Axillary, resp. rate (!) 26, height 5\' 2"  (1.575 m), weight 99.5 kg, SpO2 96 %.    Vent Mode: PSV;CPAP FiO2 (%):  [40 %] 40 % Set Rate:  [26 bmp] 26 bmp Vt Set:  [400 mL] 400 mL PEEP:  [5 cmH20] 5 cmH20 Pressure Support:  [15 cmH20] 15 cmH20 Plateau Pressure:  [16 cmH20-20 cmH20] 20 cmH20   Intake/Output Summary (Last 24 hours) at 02/03/2021 2947 Last data filed at 02/03/2021 6546 Gross per 24 hour  Intake 3071.54 ml  Output 1925 ml  Net 1146.54 ml    Filed Weights   01/31/21 0500 02/02/21 0500 02/03/21 0400  Weight: 100.9 kg 100.5 kg 99.5 kg   General:  critically ill intubated and sedated F, awake and restless HEENT: MM pink/dry, ETT in place sclera anicteric, pupils equal  Neuro: eyes open, does not focus, restless, moving all extremities, not following commands  CV: s1s2 rrr, no m/r/g PULM:  weaning 15/5 on PSV with TV 300's, mechanical breath sounds bilaterally  GI: soft, bsx4 active  Extremities: warm/dry, no edema  Skin: no rashes or lesions, vesicular lesions as noted yesterday in the sacral area       Resolved Hospital Problem list    AKI - resolved  Assessment & Plan:   Acute Hypoxemic Respiratory Failure secondary to Pneumococcal PNA  POA  -Intubated 11/30, difficulty progressing towards extubation secondary to agitation Attempted extubation  12/6, however patient had some stridor and weak cough and required re-intubation -Discussed possible trach with patient's husband, he does not think she would want prolonged life support, however he describes her as highly functioning prior to this illness and would want to proceed with  trach if indicated Plan -mental status remains a barrier to extubation -diuresed 2L yesterday, tolerating PSV today -trial resuming home Elavil, start seroquel, hold Haldol and monitor Qtc -Start Decadron 6mg  qd for upper airway edema -seems calmer today on Precedex/Fentanyl -continue Ceftriaxone for pneumococcal PNA, 14 day course, day 9 today -Maintain full vent support with SAT/SBT as tolerated -titrate Vent setting to maintain SpO2 greater than or equal to 90%. -HOB elevated 30 degrees. -Plateau pressures less than 30 cm H20.  -Follow chest x-ray, ABG prn.   -Bronchial hygiene and RT/bronchodilator protocol.   Severe Sepsis secondary to Pneumococcal PNA with Bacteremia (POA) Improving, not on pressors -continue Ceftriaxone as above   Acute Metabolic Encephalopathy  Initially suspected secondary to Sepsis (POA) Head CT negative MRI with incidental 1cm R parietal meningioma without edema CSF culture with no growth Persistent, husband mentions that confusion was the initial symptom of her current illness Ceribel EEG negative -tried off propofol and Fentanyl with low dose Precedex when extubated and did not improve, resumed home Neurontin and Elavil -Considered viral encephalitis as new vesicular lesions appeared 12/6, CSF studies 11/30 fairly unremarkable.  Discussed repeat LP with husband who deferred as these lesions are normal for her -Re-consult Neurology today, appreciate any recommendations  Vesicular Lesions Noted in the sacral area, cross the midline As noted husband says pt develops these 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, treats with acyclovir -Acyclovir initiated 12/6, HSV and zoster PCR culture is pending   Seizure Like Activity   Noted on 11/30, Ceribell negative 11/30.  Repeated on 12/1 negative as well. -d/c Keppra -prn Ativan ordered   Hypophosphatemia, Hypomagnesemia  , Hypokalemia -replete as needed  Anemia  -stable, no signs of  bleeding -transfuse for hgb <7   Urinary incontience -follow I/O  DM II with Hyperglycemia  -continue SSI   At Risk Malnutrition  -continue TF  -appreciate Nutrition  -continue folate, thiamine    Best Practice (right click and "Reselect all SmartList Selections" daily)  Diet/type: TF DVT prophylaxis: LMWH  GI prophylaxis: PPI Lines: N/A Foley:  N/A Code Status:  full code Last date of multidisciplinary goals of care discussion: 12/1   Husband updated at the bedside daily   [place] Performed by: Otilio Carpen Areta Terwilliger   Total critical care time: 42 minutes  Critical care time was exclusive of separately billable procedures and treating other patients.  Critical care was necessary to treat or prevent imminent or life-threatening deterioration.  Critical care was time spent personally by me on the following activities: development of treatment plan with patient and/or surrogate as well as nursing, discussions with consultants, evaluation of patient's response to treatment, examination of patient, obtaining history from patient or surrogate, ordering and performing treatments and interventions, ordering and review of laboratory studies, ordering and review of radiographic studies, pulse oximetry and re-evaluation of patient's condition.   Otilio Carpen Charliee Krenz, PA-C [facility] See Amion for pager If no response to pager , please call [phone] until 7pm After 7:00 pm call Elink  [phone]  Lab 01/27/21 1233 01/27/21 1428 02/03/21 0759  PHART 7.296* 7.354  7.393  PCO2ART 48.5* 43.3 51.2*  PO2ART 65.7* 194* 71.7*  HCO3 23.0 23.5 30.5*  O2SAT 88.1 99.5 92.7     CBC Recent Labs  Lab 01/31/21 0530 02/01/21 0323 02/02/21 0510  HGB 9.1* 8.1* 8.5*  HCT 29.8* 27.2* 27.5*  WBC 12.9* 10.1 10.7*  PLT 207 199 203     COAGULATION No results for input(s): INR in the last 168  hours.   CARDIAC  No results for input(s): TROPONINI in the last 168 hours. No results for input(s): PROBNP in the last 168 hours.   CHEMISTRY Recent Labs  Lab 01/29/21 1652 01/30/21 0337 01/30/21 2055 01/31/21 0530 02/01/21 0323 02/02/21 0510  NA 149* 149*  --  147* 147* 145  K 2.9* 3.2*  --  4.1 4.0 3.4*  CL 109 116*  --  113* 111 107  CO2 29 27  --  31 31 31   GLUCOSE 184* 186*  --  200* 199* 212*  BUN 32* 31*  --  35* 34* 35*  CREATININE 0.72 0.61  --  0.63 0.69 0.55  CALCIUM 10.1 8.0*  --  9.5 9.5 9.5  MG 1.5* 1.3* 2.3 2.1 2.0 1.5*  PHOS 4.9* 3.0  --  3.0 3.0 3.0    Estimated Creatinine Clearance: 67 mL/min (by C-G formula based on SCr of 0.55 mg/dL).   LIVER Recent Labs  Lab 01/28/21 0240  AST 23  ALT 28  ALKPHOS 75  BILITOT 0.8  PROT 6.4*  ALBUMIN 2.8*      INFECTIOUS Recent Labs  Lab 01/31/21 0937  LATICACIDVEN 1.1      ENDOCRINE CBG (last 3)  Recent Labs    02/02/21 1944 02/02/21 2345 02/03/21 0438  GLUCAP 211* 176* 102*          IMAGING x48h  - image(s) personally visualized  -   highlighted in bold DG Abd 1 View  Result Date: 02/01/2021 CLINICAL DATA:  Orogastric tube placement EXAM: ABDOMEN - 1 VIEW COMPARISON:  01/27/2021 abdomen radiograph, 02/01/2021 chest radiograph FINDINGS: Gastric tube tip and side port are below the diaphragm and overlying the expected location of the stomach. The bowel gas pattern is normal. No radio-opaque calculi or other significant radiographic abnormality are seen in the imaged portions of the abdomen. For findings above the diaphragm, please see same-day chest radiograph. IMPRESSION: Gastric tube in appropriate position. Electronically Signed   By: Merilyn Baba M.D.   On: 02/01/2021 14:26   DG CHEST PORT 1 VIEW  Result Date: 02/01/2021 CLINICAL DATA:  Encounter for intubation EXAM: PORTABLE CHEST 1 VIEW COMPARISON:  02/01/2021 FINDINGS: Endotracheal tube approximately 2 cm from the carina. NG  tube in stomach. Central venous line with tip in distal SVC. Focus of segmental airspace disease in the RIGHT upper lobe. Low lung volumes. IMPRESSION: 1. Endotracheal tube and central venous line appear in good position. 2. RIGHT upper lobe airspace disease suggests pneumonia. Electronically Signed   By: Suzy Bouchard M.D.   On: 02/01/2021 14:25   DG Abd Portable 1V  Result Date: 02/01/2021 CLINICAL DATA:  Enteric tube placement. EXAM: PORTABLE ABDOMEN - 1 VIEW COMPARISON:  Abdominal x-ray from same day. FINDINGS: Slight interval advancement of the enteric tube with the tip still in the distal stomach. The bowel gas pattern is normal. No radio-opaque calculi or other significant radiographic abnormality are seen. IMPRESSION: 1. Slight interval advancement of the enteric tube with the tip still in the distal stomach. Electronically Signed   By: Titus Dubin  M.D.   On: 02/01/2021 18:01   US Abdomen Limited RUQ (LIVER/GB)  Result Date: 02/02/2021 CLINICAL DATA:  Abnormal liver function tests. EXAM: ULTRASOUND ABDOMEN LIMITED RIGHT UPPER QUADRANT COMPARISON:  None. FINDINGS: Gallbladder: No gallbladder wall thickening or pericholecystic fluid is noted. No definite evidence of cholelithiasis. The presence or absence of sonographic Murphy's sign was not reported by the sonographer. Echogenic foci are noted within the gallbladder wall suggesting adenomyomatosis. Common bile duct: Diameter: 5 mm which is within normal limits. Liver: 4.9 x 4.3 x 4.3 cm hyperechoic lobulated focus is noted in the right hepatic lobe. Mildly increased echogenicity of hepatic parenchyma is noted suggesting hepatic steatosis or other diffuse hepatocellular disease. Portal vein is patent on color Doppler imaging with normal direction of blood flow towards the liver. Other: None. 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 4.9 x 4.3 x 4.3 cm lobulated hypoechogenic focus is noted in the right hepatic lobe. While this may  represent hemangioma, neoplasm cannot be excluded, and further evaluation with CT or MRI with intravenous contrast is recommended. Electronically Signed   By: Marijo Conception M.D.   On: 02/02/2021 17:43

## 2021-02-03 NOTE — Procedures (Signed)
Patient Name: Rhonda Winters  MRN: [id number]  Epilepsy Attending: Lora Havens  Referring Physician/Provider: Dr Donnetta Simpers Date: 02/03/2021 Duration: 21.38 mins  Patient history: 75 year old female with altered mental status.  EEG evaluate for seizure.    Level of alertness:  lethargic   AEDs during EEG study: GBP, propofol  Technical aspects: This EEG study was done with scalp electrodes positioned according to the 10-20 International system of electrode placement. Electrical activity was acquired at a sampling rate of 500Hz  and reviewed with a high frequency filter of 70Hz  and a low frequency filter of 1Hz . EEG data were recorded continuously and digitally stored.   Description: EEG showed continuous generalized polymorphic high amplitude 3 to 5 Hz theta-delta slowing, at times with triphasic morphology.  Hyperventilation and photic stimulation were not performed.     ABNORMALITY - Continuous slow, generalized  IMPRESSION: This study is suggestive of moderate to severe diffuse encephalopathy, nonspecific etiology. No seizures or definite epileptiform discharges were seen throughout the recording.  Trevious Rampey Barbra Sarks

## 2021-02-04 ENCOUNTER — Inpatient Hospital Stay (HOSPITAL_COMMUNITY): Payer: Medicare Other

## 2021-02-04 DIAGNOSIS — G9340 Encephalopathy, unspecified: Secondary | ICD-10-CM | POA: Diagnosis not present

## 2021-02-04 DIAGNOSIS — J9601 Acute respiratory failure with hypoxia: Secondary | ICD-10-CM | POA: Diagnosis not present

## 2021-02-04 DIAGNOSIS — A419 Sepsis, unspecified organism: Secondary | ICD-10-CM | POA: Diagnosis not present

## 2021-02-04 LAB — GLUCOSE, CAPILLARY
Glucose-Capillary: 101 mg/dL — ABNORMAL HIGH (ref 70–99)
Glucose-Capillary: 123 mg/dL — ABNORMAL HIGH (ref 70–99)
Glucose-Capillary: 151 mg/dL — ABNORMAL HIGH (ref 70–99)
Glucose-Capillary: 45 mg/dL — ABNORMAL LOW (ref 70–99)
Glucose-Capillary: 80 mg/dL (ref 70–99)
Glucose-Capillary: 82 mg/dL (ref 70–99)
Glucose-Capillary: 91 mg/dL (ref 70–99)

## 2021-02-04 LAB — TRIGLYCERIDES: Triglycerides: 205 mg/dL — ABNORMAL HIGH (ref <150)

## 2021-02-04 LAB — RPR: RPR Ser Ql: NONREACTIVE

## 2021-02-04 LAB — HIV ANTIBODY (ROUTINE TESTING W REFLEX): HIV Screen 4th Generation wRfx: NONREACTIVE

## 2021-02-04 MED ORDER — GADOBUTROL 1 MMOL/ML IV SOLN
10.0000 mL | Freq: Once | INTRAVENOUS | Status: AC | PRN
  Administered 2021-02-04: 10 mL via INTRAVENOUS

## 2021-02-04 MED ORDER — ROCURONIUM BROMIDE 10 MG/ML (PF) SYRINGE: 50.0000 mg | PREFILLED_SYRINGE | Freq: Once | INTRAVENOUS | Status: AC

## 2021-02-04 MED ORDER — ROCURONIUM BROMIDE 10 MG/ML (PF) SYRINGE
PREFILLED_SYRINGE | INTRAVENOUS | Status: AC
  Administered 2021-02-04: 50 mg via INTRAVENOUS
  Filled 2021-02-04: qty 10

## 2021-02-04 MED ORDER — DEXTROSE 50 % IV SOLN
INTRAVENOUS | Status: AC
  Administered 2021-02-04: 50 mL
  Filled 2021-02-04: qty 50

## 2021-02-04 NOTE — Progress Notes (Signed)
[facility] Progress Note Patient Name: Rhonda Winters DOB: 1945/11/15 MRN: [id number]   Date of Service  02/04/2021  HPI/Events of Note  Bradycardia - HR dropped to 38. Patient was on Precedex, Propofol and Labetalol IV PRN. Precedex IV infusion held and HR is now = 72.   eICU Interventions  Plan: D/C Labetalol.      Intervention Category Major Interventions: Arrhythmia - evaluation and management  Hannalee Castor [place] 02/04/2021, 6:49 AM

## 2021-02-04 NOTE — Progress Notes (Signed)
NAME:  Rhonda Winters, MRN:  [id number], DOB:  [birth year], LOS: 9 ADMISSION DATE:  01/25/2021, CONSULTATION DATE:  01/27/21 REFERRING MD:  Florene Glen, CHIEF COMPLAINT:  AMS   BRIEF  75 year old woman w/ hx of metabolic syndrome presenting with increasing confusion.  Workup has revealed RUL Pneumonia and pneumococcal bacteremia.  Unfortunately became more agitated through day, given ativan but then became obtunded with increased WOB.  Initial dose flumazenil ineffective.  Transferred to ICU and PCCM consulted.     Pertinent  Medical History  Hypothyroidism HTN Prior stroke T2DM  Significant Hospital Events: Including procedures, antibiotic start and stop dates in addition to other pertinent events   11/29 admitted 11/30 decompensation, intubation.  Attempted LP. 12/1 seizure-like activity, Ceribell in progress.  AED's initiated for seizure like movement.  IR LP. CT Head with no acute intracranial abnormalities, chronic small vessel disease, chronic R PCA territory infarct 12.2 - Afebrile . RN reports no seizure activity overnight.  Propofol turned off this am, remains on 50 mcg fentanyl. D5 turned off with hyperglycemia/ Pt waking, following simple commands intermittently, purposeful movement . Blood cultures with pan sensitive strep pneumo. Vent - 40-50% FiO2, PEEP 5 Glucose range 177-185 12/3 - given lasix yesterday -> then soft BP -> needed fluids. Had SVT ->on cardizem and resolved. On 50% fio2. On fent gtt/diprivan gtt (prior intolreance to precedex) -> agitated encephalopathy +. Afebrile 12/5 SBT done on prop/fent and failed 2/2 small TV, trial Precedex again.   Failed extubation->reintubated 12/6 No overnight events, calm on Precedex/Fentanyl 12/7 Diuresed well, overnight hypotension with transient pressors resolved this AM, no improvement in encephalopathy, vesicular lesions found on sacrum yesterday, viral cultures pending 12/8 Continue to remains significantly encephalopathic. Eyes are  spontaneously open and movement seen in all extremities but she will no track and unable to follow any commands on dilaudid and propofol drip   Interim History / Subjective:  Remains 7L positive with drop in urine output overnight   Objective   Blood pressure (!) 113/29, pulse (!) 51, temperature (P) 97.7 F (36.5 C), temperature source Axillary, resp. rate (!) 26, height 5\' 2"  (1.575 m), weight 97.3 kg, SpO2 96 %.    Vent Mode: PRVC FiO2 (%):  [30 %-40 %] 30 % Set Rate:  [26 bmp] 26 bmp Vt Set:  [400 mL] 400 mL PEEP:  [5 cmH20] 5 cmH20 Pressure Support:  [15 cmH20] 15 cmH20 Plateau Pressure:  [19 cmH20-26 cmH20] 22 cmH20   Intake/Output Summary (Last 24 hours) at 02/04/2021 0701 Last data filed at 02/04/2021 0600 Gross per 24 hour  Intake 3427.57 ml  Output 925 ml  Net 2502.57 ml    Filed Weights   02/02/21 0500 02/03/21 0400 02/04/21 0500  Weight: 100.5 kg 99.5 kg 97.3 kg   Physical Exam  General: Acute on chronically ill appearing obese elderly female lying in bed on mechanical ventilation, in NAD HEENT: ETT, MM pink/moist, PERRL,  Neuro: Eyes spontaneously open but unable to track or follow any commands, movement seen in all extremities  CV: s1s2 regular rate and rhythm, no murmur, rubs, or gallops,  PULM:  Clear to ascultation bilaterally, tolerating vent but slight dyssynchrony due to agitation     GI: soft, bowel sounds active in all 4 quadrants, non-tender, non-distended, tolerating TF Extremities: warm/dry, 2+ generalized pitting edema  Skin: Sacral lesions as below      Resolved Hospital Problem list   AKI  -resolved Seizure Like Activity   -Noted on 11/30,  Ceribell negative 11/30.  Repeated on 12/1 negative as well.  Assessment & Plan:   Acute Hypoxemic Respiratory Failure secondary to Pneumococcal PNA  POA  -Intubated 11/30, difficulty progressing towards extubation secondary to agitation Attempted extubation 12/6, however patient had some stridor and  weak cough and required re-intubation -Discussed possible trach with patient's husband, he does not think she would want prolonged life support, however he describes her as highly functioning prior to this illness and would want to proceed with trach if indicated Pneumococcal PNA with Bacteremia (POA) P: Continue ventilator support with lung protective strategies  Mentation continues to remain barrier to extubation  Wean PEEP and FiO2 for sats greater than 90%. Head of bed elevated 30 degrees. Plateau pressures less than 30 cm H20.  Follow intermittent chest x-ray and ABG.   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  Discussed repeat LP with husband who deferred as these lesions are normal for her P: Neurology reconsulted 12/7 Maintain neuro protective measures; goal for eurothermia, euglycemia, eunatermia, normoxia, and PCO2 goal of 35-40 Nutrition and bowel regiment  Seizure precautions  Aspirations precautions  Continue Acyclovir  Continue home Elavil and Seroquel   Vesicular Lesions -Noted in the sacral area, cross the midline -As noted husband says pt develops these 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, treats with acyclovir P: Continue Acyclovir  HSV PCR remains pending   Hypophosphatemia Hypomagnesemia   Hypokalemia P: Supplement as needed   Anemia  P: Repeat CBC in AM  Transfuse per protocol  Hgb goal > 7  Urinary incontience P: External urinary  cath   DM II with Hyperglycemia  P: Continue SSI CBG checks q4 CBG goal 140-180  At Risk Malnutrition  P: Continue TF Supplement Folate and Thiamine   Best Practice (right click and "Reselect all SmartList Selections" daily)  Diet/type: TF DVT prophylaxis: LMWH  GI prophylaxis: PPI Lines: N/A Foley:  N/A Code Status:  full code Last date of multidisciplinary goals of care discussion: Continue to update family regarding plan of care. Patient will likely need trach for extended vent wean secondary to AMS   Critical care time:    Performed by: Sylas Twombly D. Harris  Total critical care time: 40 minutes  Critical care time was exclusive of separately billable procedures and treating other patients.  Critical care was necessary to treat or prevent imminent or life-threatening deterioration.  Critical care was time spent personally by me on the following activities: development of treatment plan with patient and/or surrogate as well as nursing, discussions with consultants, evaluation of patient's response to treatment, examination of patient, obtaining history from patient or surrogate, ordering and performing treatments and interventions, ordering and review of laboratory studies, ordering and review of radiographic studies, pulse oximetry and re-evaluation of patient's condition.  Hartman Minahan D. Kenton Kingfisher, NP-C [facility] Personal contact information can be found on Amion  02/04/2021, 7:11 AM

## 2021-02-05 ENCOUNTER — Inpatient Hospital Stay: Payer: Self-pay

## 2021-02-05 DIAGNOSIS — A419 Sepsis, unspecified organism: Secondary | ICD-10-CM | POA: Diagnosis not present

## 2021-02-05 DIAGNOSIS — R4182 Altered mental status, unspecified: Secondary | ICD-10-CM | POA: Diagnosis not present

## 2021-02-05 LAB — CBC
HCT: 30.1 % — ABNORMAL LOW (ref 36.0–46.0)
Hemoglobin: 9 g/dL — ABNORMAL LOW (ref 12.0–15.0)
MCH: 32.3 pg (ref 26.0–34.0)
MCHC: 29.9 g/dL — ABNORMAL LOW (ref 30.0–36.0)
MCV: 107.9 fL — ABNORMAL HIGH (ref 80.0–100.0)
Platelets: 383 10*3/uL (ref 150–400)
RBC: 2.79 MIL/uL — ABNORMAL LOW (ref 3.87–5.11)
RDW: 14.7 % (ref 11.5–15.5)
WBC: 15.9 10*3/uL — ABNORMAL HIGH (ref 4.0–10.5)
nRBC: 0 % (ref 0.0–0.2)

## 2021-02-05 LAB — GLUCOSE, CAPILLARY
Glucose-Capillary: 93 mg/dL (ref 70–99)
Glucose-Capillary: 106 mg/dL — ABNORMAL HIGH (ref 70–99)
Glucose-Capillary: 122 mg/dL — ABNORMAL HIGH (ref 70–99)
Glucose-Capillary: 120 mg/dL — ABNORMAL HIGH (ref 70–99)
Glucose-Capillary: 88 mg/dL (ref 70–99)

## 2021-02-05 LAB — BASIC METABOLIC PANEL
Anion gap: 11 (ref 5–15)
BUN: 84 mg/dL — ABNORMAL HIGH (ref 8–23)
CO2: 23 mmol/L (ref 22–32)
Calcium: 9.1 mg/dL (ref 8.9–10.3)
Chloride: 106 mmol/L (ref 98–111)
Creatinine, Ser: 1.46 mg/dL — ABNORMAL HIGH (ref 0.44–1.00)
GFR, Estimated: 37 mL/min — ABNORMAL LOW (ref >60)
Glucose, Bld: 274 mg/dL — ABNORMAL HIGH (ref 70–99)
Potassium: 4 mmol/L (ref 3.5–5.1)
Sodium: 140 mmol/L (ref 135–145)

## 2021-02-05 LAB — TROPONIN I (HIGH SENSITIVITY): Troponin I (High Sensitivity): 8 ng/L (ref <18)

## 2021-02-05 MED ORDER — FUROSEMIDE 10 MG/ML IJ SOLN
60.0000 mg | Freq: Once | INTRAMUSCULAR | Status: AC
Start: 2021-02-05 — End: 2021-02-05
  Administered 2021-02-05: 60 mg via INTRAVENOUS
  Filled 2021-02-05: qty 6

## 2021-02-05 MED ORDER — SODIUM CHLORIDE 0.9% FLUSH: 10.0000 mL | INTRAVENOUS | Status: DC | PRN

## 2021-02-05 MED ORDER — SODIUM CHLORIDE 0.9% FLUSH
10.0000 mL | Freq: Two times a day (BID) | INTRAVENOUS | Status: DC
  Administered 2021-02-05 – 2021-02-07 (×5): 10 mL
  Administered 2021-02-08: 30 mL
  Administered 2021-02-09: 40 mL
  Administered 2021-02-09 – 2021-02-10 (×2): 30 mL
  Administered 2021-02-11: 40 mL
  Administered 2021-02-11: 30 mL
  Administered 2021-02-12: 40 mL

## 2021-02-05 NOTE — Consult Note (Addendum)
WOC Nurse Consult Note: Patient receiving care in [place] completed remotely after review of chart, history and images.  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  Pressure Injury POA: NA WOC nurse paged to review photos in the chart. 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 The patient is currently being treated systemically with Acyclovir which is the best treatment at this time. Leave open to air and turn patient every 2 hours if possible.  Monitor the wound area(s) for worsening of condition such as: Signs/symptoms of infection, increase in size, development of or worsening of odor, development of pain, or increased pain at the affected locations.   Notify the medical team if any of these develop.  Thank you for the consult. [place] nurse will not follow at this time.   Please re-consult the [place] team if needed.  Rhonda Marseilles Tamala Julian, MSN, RN, [place], Lysle Pearl, [facility] Wound Treatment Associate Pager [phone]

## 2021-02-05 NOTE — Progress Notes (Signed)
Neurology Progress Note   S:// No changes Was given versed in preparation for PICC line placement that makes for a very limited exam.  O:// Current vital signs: BP 118/68   Pulse 79   Temp 98 F (36.7 C) (Axillary)   Resp (!) 26   Ht _0  (1.575 m)   Wt 97.3 kg   SpO2 96%   BMI 39.23 kg/m  Vital signs in last 24 hours: Temp:  [97.9 F (36.6 C)-98 F (36.7 C)] 98 F (36.7 C) (12/09 0400) Pulse Rate:  [52-155] 79 (12/09 1230) Resp:  [17-28] 26 (12/09 1230) BP: (51-144)/(30-70) 118/68 (12/09 1230) SpO2:  [83 %-100 %] 96 % (12/09 1230) FiO2 (%):  [30 %-40 %] 30 % (12/09 1150) LIMITED EXAM WITH VERSED GIVEN MINUTES BEFORE EXAM Mental status - intubated, sedated, non verbal, does not follow  IR:SWNIO, gaze midline, face symmetric Motor: minimal withdrawal to nox stim in all 4s Sensory: as above  Gen: sedated intubated HEENT: [place] AT  CVS: RRR Resp: vented  Medications  Current Facility-Administered Medications:    0.9 %  sodium chloride infusion, , Intravenous, Continuous, McQuaid, Douglas B, MD, Stopped at 02/02/21 2325   0.9 %  sodium chloride infusion, 250 mL, Intravenous, Continuous, Anders Simmonds, MD, Last Rate: 10 mL/hr at 02/05/21 0600, Infusion Verify at 02/05/21 0600   acetaminophen (TYLENOL) tablet 650 mg, 650 mg, Oral, Q6H PRN **OR** acetaminophen (TYLENOL) suppository 650 mg, 650 mg, Rectal, Q6H PRN, Rise Patience, MD   acyclovir (ZOVIRAX) 200 MG/5ML suspension SUSP 800 mg, 800 mg, Per Tube, 5 X Daily, McQuaid, Douglas B, MD, 800 mg at 02/05/21 0939   albuterol (PROVENTIL) (2.5 MG/3ML) 0.083% nebulizer solution 2.5 mg, 2.5 mg, Nebulization, Q4H PRN, Simonne Maffucci B, MD   amitriptyline (ELAVIL) tablet 25 mg, 25 mg, Per Tube, QHS, Gleason, Otilio Carpen, PA-C, 25 mg at 02/04/21 2140   cefTRIAXone (ROCEPHIN) 2 g in sodium chloride 0.9 % 100 mL IVPB, 2 g, Intravenous, Q24H, Dimple Nanas, RPH, Stopped at 02/05/21 0013   chlorhexidine gluconate (MEDLINE  KIT) (PERIDEX) 0.12 % solution 15 mL, 15 mL, Mouth Rinse, BID, Candee Furbish, MD, 15 mL at 02/05/21 0740   Chlorhexidine Gluconate Cloth 2 % PADS 6 each, 6 each, Topical, Daily, Candee Furbish, MD, 6 each at 02/05/21 0512   dexamethasone (DECADRON) injection 6 mg, 6 mg, Intravenous, Daily, Gleason, Otilio Carpen, PA-C, 6 mg at 02/05/21 0935   docusate (COLACE) 50 MG/5ML liquid 100 mg, 100 mg, Per Tube, BID, Candee Furbish, MD, 100 mg at 02/04/21 0955   enoxaparin (LOVENOX) injection 40 mg, 40 mg, Subcutaneous, Q24H, Candee Furbish, MD, 40 mg at 02/05/21 0740   feeding supplement (PROSource TF) liquid 45 mL, 45 mL, Per Tube, BID, Candee Furbish, MD, 45 mL at 02/05/21 0935   feeding supplement (VITAL HIGH PROTEIN) liquid 1,000 mL, 1,000 mL, Per Tube, Continuous, Candee Furbish, MD, Last Rate: 50 mL/hr at 02/04/21 1618, 1,000 mL at 27/03/50 0938   folic acid (FOLVITE) tablet 1 mg, 1 mg, Per Tube, Daily, Green, Terri L, RPH, 1 mg at 02/05/21 0936   gabapentin (NEURONTIN) 250 MG/5ML solution 100 mg, 100 mg, Per Tube, TID, Gleason, Otilio Carpen, PA-C, 100 mg at 02/05/21 0936   HYDROmorphone (DILAUDID) 50 mg in sodium chloride 0.9 % 100 mL (0.5 mg/mL) infusion, 0.5-4 mg/hr, Intravenous, Continuous, Gleason, Otilio Carpen, PA-C, Last Rate: 6.5 mL/hr at 02/05/21 0955, 3.25 mg/hr at 02/05/21 0955   HYDROmorphone (  DILAUDID) bolus via infusion 0.25-1 mg, 0.25-1 mg, Intravenous, Q30 min PRN, Gleason, Otilio Carpen, PA-C, 1 mg at 02/04/21 0723   insulin aspart (novoLOG) injection 0-9 Units, 0-9 Units, Subcutaneous, Q4H, Rise Patience, MD, 2 Units at 02/04/21 0403   levothyroxine (SYNTHROID) tablet 125 mcg, 125 mcg, Per Tube, Q0600, Minda Ditto, RPH, 125 mcg at 02/05/21 0507   MEDLINE mouth rinse, 15 mL, Mouth Rinse, 10 times per day, Candee Furbish, MD, 15 mL at 02/05/21 1143   melatonin tablet 3 mg, 3 mg, Per Tube, QHS, Green, Terri L, RPH, 3 mg at 02/04/21 2139   midazolam (VERSED) injection 2 mg, 2 mg, Intravenous,  Q6H PRN, Gleason, Otilio Carpen, PA-C, 2 mg at 02/05/21 1259   midazolam (VERSED) injection 2 mg, 2 mg, Intravenous, Once, Gleason, Otilio Carpen, PA-C   multivitamin liquid 15 mL, 15 mL, Per Tube, Daily, Green, Terri L, RPH, 15 mL at 02/05/21 0935   pantoprazole (PROTONIX) injection 40 mg, 40 mg, Intravenous, QHS, Candee Furbish, MD, 40 mg at 02/04/21 2139   phenylephrine (NEO-SYNEPHRINE) 47m/NS 2557mpremix infusion, 25-200 mcg/min, Intravenous, Titrated, SoAnders SimmondsMD, Last Rate: 37.5 mL/hr at 02/05/21 1142, 50 mcg/min at 02/05/21 1142   polyethylene glycol (MIRALAX / GLYCOLAX) packet 17 g, 17 g, Per Tube, Daily, SmCandee FurbishMD, 17 g at 02/04/21 0955   propofol (DIPRIVAN) 1000 MG/100ML infusion, 5-80 mcg/kg/min, Intravenous, Titrated, Gleason, LaOtilio CarpenPA-C, Last Rate: 11.94 mL/hr at 02/05/21 1142, 20 mcg/kg/min at 02/05/21 1142   QUEtiapine (SEROQUEL) tablet 100 mg, 100 mg, Per Tube, QHS, Gleason, LaOtilio CarpenPA-C, 100 mg at 02/04/21 2138   sodium chloride flush (NS) 0.9 % injection 10-40 mL, 10-40 mL, Intracatheter, Q12H, McSimonne Maffucci, MD, 10 mL at 02/05/21 0935   sodium chloride flush (NS) 0.9 % injection 10-40 mL, 10-40 mL, Intracatheter, PRN, McJuanito DoomMD   [COMPLETED] thiamine 50013mn normal saline (47m71mVPB, 500 mg, Intravenous, Q8H, Stopped at 02/05/21 0550 **FOLLOWED BY** [START ON 02/06/2021] thiamine (B-1) 250 mg in sodium chloride 0.9 % 50 mL IVPB, 250 mg, Intravenous, Daily **FOLLOWED BY** [START ON 02/12/2021] thiamine (B-1) injection 100 mg, 100 mg, Intravenous, Daily, KhalDonnetta Simpers [facility] CBC    Component Value Date/Time   WBC 10.7 (H) 02/02/2021 0510   RBC 2.64 (L) 02/02/2021 0510   HGB 8.5 (L) 02/02/2021 0510   HCT 27.5 (L) 02/02/2021 0510   PLT 203 02/02/2021 0510   MCV 104.2 (H) 02/02/2021 0510   MCH 32.2 02/02/2021 0510   MCHC 30.9 02/02/2021 0510   RDW 14.5 02/02/2021 0510   LYMPHSABS 0.6 (L) 01/26/2021 0302   MONOABS 0.2 01/26/2021 0302    EOSABS 0.0 01/26/2021 0302   BASOSABS 0.1 01/26/2021 0302    CMP     Component Value Date/Time   NA 146 (H) 02/03/2021 1446   K 4.8 02/03/2021 1446   CL 116 (H) 02/03/2021 1446   CO2 23 02/03/2021 1446   GLUCOSE 197 (H) 02/03/2021 1446   BUN 40 (H) 02/03/2021 1446   CREATININE 0.73 02/03/2021 1446   CALCIUM 7.8 (L) 02/03/2021 1446   PROT 5.6 (L) 02/03/2021 1446   ALBUMIN 2.2 (L) 02/03/2021 1446   AST 23 02/03/2021 1446   ALT 14 02/03/2021 1446   ALKPHOS 48 02/03/2021 1446   BILITOT 2.2 (H) 02/03/2021 1446   GFRNONAA >60 02/03/2021 1446   Imaging I have reviewed images in epic and the results pertinent to this consultation are:  MR Brain x2 in the past 8 days- no acute chages. [place] Right PCA infarct. 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 HLD, hypothyrodisim, coming in for eval of AMS. Found to have Pneumococcal pneumonia for which she is being treated. Became agitated and hypoxic requiring intubation. Mentation seems to be far from baseline. Work up to include EEG, MR and LP all unremarkable. Question of viral lesions on the back c/f HSV - but CSF had zero WBCs making HSV unlikely. A psychiatric etiology should be kept in the differential but she does not have an extensive history pointing. At this time - will recommend full day of cEEG (at [facility]) to r/o electrographic status.   Recommendations: Transfer to [facility] cEEG when there We will continue to follow.  -- Amie Portland, MD Neurologist [facility] Pager: [phone]

## 2021-02-05 NOTE — Progress Notes (Signed)
Pharmacy Antibiotic Note  Rhonda Winters is a 75 y.o. female admitted on 01/25/2021 with pneumonia.  Now with new vesicular lesions in the sacral area- concern for herpes zoster in the setting of acute infection.  Pharmacy has been consulted for acyclovir dosing.  - 12/6 varicella-zoster pcr --> pending - scr 0.73 on 12/7 (crcl~66) - day #4 of acyclovir  Plan: - Acyclovir 800mg  per tube 5 times daily for shingles treatment - f/u varicella-zoster pcr - pharmacy will sign off for acyclovir. Re-consult [place] if need further assistance  ______________________________________ Height: 5\' 2"  (157.5 cm) Weight: 97.3 kg (214 lb 8.1 oz) IBW/kg (Calculated) : 50.1  Temp (24hrs), Avg:97.8 F (36.6 C), Min:97.5 F (36.4 C), Max:98 F (36.7 C)  Recent Labs  Lab 01/30/21 0337 01/31/21 0530 01/31/21 0937 02/01/21 0323 02/02/21 0510 02/03/21 1446  WBC 10.7* 12.9*  --  10.1 10.7*  --   CREATININE 0.61 0.63  --  0.69 0.55 0.73  LATICACIDVEN  --   --  1.1  --   --   --      Estimated Creatinine Clearance: 66.2 mL/min (by C-G formula based on SCr of 0.73 mg/dL).    Allergies  Allergen Reactions   Hydrocodone-Acetaminophen Nausea And Vomiting    Dia Sitter, PharmD, BCPS 02/05/2021 8:06 AM

## 2021-02-05 NOTE — Progress Notes (Signed)
Peripherally Inserted Central Catheter Placement  The IV Nurse has discussed with the patient and/or persons authorized to consent for the patient, the purpose of this procedure and the potential benefits and risks involved with this procedure.  The benefits include less needle sticks, lab draws from the catheter, and the patient may be discharged home with the catheter. Risks include, but not limited to, infection, bleeding, blood clot (thrombus formation), and puncture of an artery; nerve damage and irregular heartbeat and possibility to perform a PICC exchange if needed/ordered by physician.  Alternatives to this procedure were also discussed.  Bard Power PICC patient education guide, fact sheet on infection prevention and patient information card has been provided to patient /or left at bedside.    PICC Placement Documentation  PICC Triple Lumen 02/05/21 PICC Right Cephalic 36 cm 1 cm (Active)  Indication for Insertion or Continuance of Line Vasoactive infusions 02/05/21 1320  Exposed Catheter (cm) 1 cm 02/05/21 1320  Site Assessment Clean;Dry;Intact 02/05/21 1320  Lumen #1 Status Flushed;Saline locked;Blood return noted 02/05/21 1320  Lumen #2 Status Flushed;Saline locked;Blood return noted 02/05/21 1320  Lumen #3 Status Flushed;Saline locked;Blood return noted 02/05/21 1320  Dressing Type Transparent;Securing device 02/05/21 1320  Dressing Status Clean;Dry;Intact 02/05/21 1320  Antimicrobial disc in place? Yes 02/05/21 1320  Safety Lock Not Applicable 74/82/70 7867  Dressing Intervention Other (Comment) 02/05/21 1320  Dressing Change Due 02/12/21 02/05/21 1320    Consent signed per patient's husband, Weston Anna RN, via telephone consent. Verified by 2 PICC RNs.   Enos Fling 02/05/2021, 1:22 PM

## 2021-02-05 NOTE — Progress Notes (Signed)
NAME:  Rhonda Winters, MRN:  [id number], DOB:  1945/11/04, LOS: 40 ADMISSION DATE:  01/25/2021, CONSULTATION DATE:  01/27/21 REFERRING MD:  Florene Glen, CHIEF COMPLAINT:  AMS   BRIEF  75 year old woman w/ hx of metabolic syndrome presenting with increasing confusion.  Workup has revealed RUL Pneumonia and pneumococcal bacteremia.  Unfortunately became more agitated through day, given ativan but then became obtunded with increased WOB.  Initial dose flumazenil ineffective.  Transferred to ICU and PCCM consulted.     Pertinent  Medical History  Hypothyroidism HTN Prior stroke T2DM  Significant Hospital Events: Including procedures, antibiotic start and stop dates in addition to other pertinent events   11/29 admitted 11/30 decompensation, intubation.  Attempted LP. 12/1 seizure-like activity, Ceribell in progress.  AED's initiated for seizure like movement.  IR LP. CT Head with no acute intracranial abnormalities, chronic small vessel disease, chronic R PCA territory infarct 12.2 - Afebrile . RN reports no seizure activity overnight.  Propofol turned off this am, remains on 50 mcg fentanyl. D5 turned off with hyperglycemia/ Pt waking, following simple commands intermittently, purposeful movement . Blood cultures with pan sensitive strep pneumo. Vent - 40-50% FiO2, PEEP 5 Glucose range 177-185 12/3 - given lasix yesterday -> then soft BP -> needed fluids. Had SVT ->on cardizem and resolved. On 50% fio2. On fent gtt/diprivan gtt (prior intolreance to precedex) -> agitated encephalopathy +. Afebrile 12/5 SBT done on prop/fent and failed 2/2 small TV, trial Precedex again.   Failed extubation->reintubated 12/6 No overnight events, calm on Precedex/Fentanyl 12/7 Diuresed well, overnight hypotension with transient pressors resolved this AM, no improvement in encephalopathy, vesicular lesions found on sacrum yesterday, viral cultures pending 12/8 Continue to remains significantly encephalopathic. Eyes  are spontaneously open and movement seen in all extremities but she will no track and unable to follow any commands on dilaudid and propofol drip  12/9 MRI brain repeated 12/8 and remains negative.  Mentation has not changed she continues to remain severely encephalopathic  Interim History / Subjective:  Eyes again spontaneously open but not following any commands and appears encephalopathic Significantly volume overloaded, estimated 10 L positive currently Minimal urine output Difficulty obtaining lab work, PICC line ordered  Objective   Blood pressure (!) 121/43, pulse 93, temperature 98 F (36.7 C), temperature source Axillary, resp. rate (!) 26, height 5\' 2"  (1.575 m), weight 97.3 kg, SpO2 97 %.    Vent Mode: PRVC FiO2 (%):  [30 %-45 %] 30 % Set Rate:  [26 bmp] 26 bmp Vt Set:  [400 mL] 400 mL PEEP:  [5 cmH20] 5 cmH20 Plateau Pressure:  [17 cmH20-22 cmH20] 21 cmH20   Intake/Output Summary (Last 24 hours) at 02/05/2021 1035 Last data filed at 02/05/2021 0600 Gross per 24 hour  Intake 3206.51 ml  Output 350 ml  Net 2856.51 ml    Filed Weights   02/02/21 0500 02/03/21 0400 02/04/21 0500  Weight: 100.5 kg 99.5 kg 97.3 kg   Physical Exam  General: Acute on chronic ill-appearing elderly female lying in bed on mechanical ventilation in no acute distress HEENT: ETT, MM pink/moist, PERRL,  Neuro: Eyes spontaneously open and seen moving extremities but unable to follow any commands or track movement in room CV: s1s2 regular rate and rhythm, no murmur, rubs, or gallops,  PULM: Slightly diminished air entry bilaterally, no increased work of breathing, no added breath sounds GI: soft, bowel sounds active in all 4 quadrants, non-tender, non-distended, tolerating TF Extremities: warm/dry, generalized 2-3+ pitting edema  Skin: no rashes or lesions   Resolved Hospital Problem list   AKI  -resolved Seizure Like Activity   -Noted on 11/30, Ceribell negative 11/30.  Repeated on 12/1  negative as well.  Assessment & Plan:   Acute Hypoxemic Respiratory Failure secondary to Pneumococcal PNA  POA  -Intubated 11/30, difficulty progressing towards extubation secondary to agitation Attempted extubation 12/6, however patient had some stridor and weak cough and required re-intubation -Discussed possible trach with patient's husband, he does not think she would want prolonged life support, however he describes her as highly functioning prior to this illness and would want to proceed with trach if indicated Pneumococcal PNA with Bacteremia (POA) P: Mentation continues to remain a barrier to extubation Will discuss with husband timing for tracheostomy placement Continue ventilator support with lung protective strategies  Wean PEEP and FiO2 for sats greater than 90%. Head of bed elevated 30 degrees. Plateau pressures less than 30 cm H20.  Follow intermittent chest x-ray and ABG.   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  Discussed repeat LP with husband who deferred as these lesions are normal for her P: Neurology following, appreciate assistance Repeat MRI brain 12/8 negative for acute abnormalities Management per neurology  Maintain neuro protective measures; goal for eurothermia, euglycemia, eunatermia, normoxia, and PCO2 goal of 35-40 Nutrition and bowel regiment  Seizure  precautions  Aspirations precautions  Continue acyclovir Neurology recommended possible psych consult will coordinate with team to determine if this is still indicated  Vesicular Lesions -Noted in the sacral area, cross the midline -As noted husband says pt develops these 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, treats with acyclovir P: Continue acyclovir HSV 2 PCR positive  Hypophosphatemia Hypomagnesemia   Hypokalemia P: Supplement as needed  Anemia  P: Currently having difficulty obtaining lab sticks when able check CBC, PICC ordered Hemoglobin goal greater than 7  Urinary incontience P: Continue external urinary catheter  DM II with Hyperglycemia  P: Continue SSI CBG checks every 4 hours CBG goal 140-180   At Risk Malnutrition  P: Continue tube feeds Supplement folate and thiamine    Best Practice (right click and "Reselect all SmartList Selections" daily)  Diet/type: TF DVT prophylaxis: LMWH  GI prophylaxis: PPI Lines: N/A Foley:  N/A Code Status:  full code Last date of multidisciplinary goals of care discussion: Continue to update family regarding plan of care. Patient will likely need trach for extended vent wean secondary to AMS   Critical care time:    Performed by: Maha Fischel D. Harris  Total critical care time: 38 minutes  Critical care time was exclusive of separately billable procedures and treating other patients.  Critical care was necessary to treat or prevent imminent or life-threatening deterioration.  Critical care was time spent personally by me on the following activities: development of treatment plan with patient and/or surrogate as well as nursing, discussions with consultants, evaluation of patient's response to treatment, examination of patient, obtaining history from patient or surrogate, ordering and  performing treatments and interventions, ordering and review of laboratory studies, ordering and review of radiographic studies,  pulse oximetry and re-evaluation of patient's condition.  Bralon Antkowiak D. Kenton Kingfisher, NP-C [facility] Personal contact information can be found on Amion  02/05/2021, 10:35 AM

## 2021-02-06 ENCOUNTER — Inpatient Hospital Stay (HOSPITAL_COMMUNITY): Payer: Medicare Other

## 2021-02-06 DIAGNOSIS — J9601 Acute respiratory failure with hypoxia: Secondary | ICD-10-CM | POA: Diagnosis not present

## 2021-02-06 DIAGNOSIS — G9340 Encephalopathy, unspecified: Secondary | ICD-10-CM | POA: Diagnosis not present

## 2021-02-06 LAB — BASIC METABOLIC PANEL
Anion gap: 8 (ref 5–15)
BUN: 78 mg/dL — ABNORMAL HIGH (ref 8–23)
CO2: 22 mmol/L (ref 22–32)
Calcium: 7.9 mg/dL — ABNORMAL LOW (ref 8.9–10.3)
Chloride: 111 mmol/L (ref 98–111)
Creatinine, Ser: 0.98 mg/dL (ref 0.44–1.00)
GFR, Estimated: >60 mL/min (ref >60)
Glucose, Bld: 146 mg/dL — ABNORMAL HIGH (ref 70–99)
Potassium: 3 mmol/L — ABNORMAL LOW (ref 3.5–5.1)
Sodium: 141 mmol/L (ref 135–145)

## 2021-02-06 LAB — GLUCOSE, CAPILLARY
Glucose-Capillary: 111 mg/dL — ABNORMAL HIGH (ref 70–99)
Glucose-Capillary: 113 mg/dL — ABNORMAL HIGH (ref 70–99)
Glucose-Capillary: 161 mg/dL — ABNORMAL HIGH (ref 70–99)
Glucose-Capillary: 164 mg/dL — ABNORMAL HIGH (ref 70–99)
Glucose-Capillary: 215 mg/dL — ABNORMAL HIGH (ref 70–99)
Glucose-Capillary: 65 mg/dL — ABNORMAL LOW (ref 70–99)
Glucose-Capillary: 70 mg/dL (ref 70–99)
Glucose-Capillary: 73 mg/dL (ref 70–99)
Glucose-Capillary: 99 mg/dL (ref 70–99)

## 2021-02-06 MED ORDER — ARTIFICIAL TEARS OPHTHALMIC OINT
TOPICAL_OINTMENT | OPHTHALMIC | Status: DC
Start: 2021-02-06 — End: 2021-02-06

## 2021-02-06 MED ORDER — LORAZEPAM 2 MG/ML IJ SOLN
INTRAMUSCULAR | Status: AC
  Filled 2021-02-06: qty 1

## 2021-02-06 MED ORDER — POTASSIUM CHLORIDE 20 MEQ PO PACK
40.0000 meq | PACK | Freq: Once | ORAL | Status: AC
  Administered 2021-02-06: 40 meq
  Filled 2021-02-06: qty 2

## 2021-02-06 MED ORDER — POTASSIUM CHLORIDE 10 MEQ/50ML IV SOLN
10.0000 meq | INTRAVENOUS | Status: AC
  Administered 2021-02-06 (×4): 10 meq via INTRAVENOUS
  Filled 2021-02-06 (×4): qty 50

## 2021-02-06 MED ORDER — LORAZEPAM 2 MG/ML IJ SOLN
2.0000 mg | Freq: Once | INTRAMUSCULAR | Status: AC
  Administered 2021-02-06: 2 mg via INTRAVENOUS

## 2021-02-06 NOTE — Progress Notes (Signed)
Notified that patient had bed placement at [facility] [id number]. Called 4N nurses station and was put on hold without talking to anyone. Held for 3 minutes, but need to finish other patients AM meds. Will call back.

## 2021-02-06 NOTE — Progress Notes (Signed)
[facility] Progress Note Patient Name: Rhonda Winters DOB: 08/02/1945 MRN: [id number]   Date of Service  02/06/2021  HPI/Events of Note  Potassium 3.0  eICU Interventions  Replacement ordered     Intervention Category Intermediate Interventions: Electrolyte abnormality - evaluation and management  Mauri Brooklyn, P 02/06/2021, 5:52 AM

## 2021-02-06 NOTE — Progress Notes (Signed)
Report given to 4N RN by night shift RN, Norman Clay. Report given to [facility] RN. This RN called and updated 4N RN, Stanton Kidney who will be receiving patientPatient transferred via [place]. Vital signs stable.

## 2021-02-06 NOTE — Progress Notes (Signed)
1315 RN in room, pt noted to have bilateral upper arm twitching. RN placed call to neuro and new orders received. WCTM.

## 2021-02-06 NOTE — Progress Notes (Signed)
vLTM started   Baseline EEG completed o 02/03/21 at [facility].  MRI leads placed.  All impedances below 10kohms.  [facility] to monitor  Patient event button tested

## 2021-02-06 NOTE — Progress Notes (Signed)
Neurology Progress Note   S:// No acute events overnight. Transferred to [facility] for a cEEG.  O:// Current vital signs: BP 122/64   Pulse 84   Temp 98.5 F (36.9 C) (Oral)   Resp (!) 23   Ht _0  (1.575 m)   Wt 98.8 kg   SpO2 96%   BMI 39.84 kg/m  Vital signs in last 24 hours: Temp:  [98.5 F (36.9 C)-99.1 F (37.3 C)] 98.5 F (36.9 C) (12/10 0430) Pulse Rate:  [72-94] 84 (12/10 1045) Resp:  [10-27] 23 (12/10 0930) BP: (79-154)/(29-81) 122/64 (12/10 1045) SpO2:  [92 %-100 %] 96 % (12/10 1045) FiO2 (%):  [30 %] 30 % (12/10 0923) Weight:  [98.8 kg] 98.8 kg (12/10 0500)  Mental status - intubated, sedated, non verbal, does not follow. Eyes open. Does not track. UL:AGTXM, gaze midline, face with symmetric grimace to noxious stimuli. Motor: minimal withdrawal to nox stim in all 4s Sensory: as above Reflexes: 2+ in BL upper extremities. Gen: sedated on dilaudid, intubated HEENT: [place] AT  CVS: RRR Resp: vented  Medications  Current Facility-Administered Medications:    0.9 %  sodium chloride infusion, , Intravenous, Continuous, McQuaid, Douglas B, MD, Stopped at 02/02/21 2325   0.9 %  sodium chloride infusion, 250 mL, Intravenous, Continuous, Oletta Darter Virgina Evener, MD, Stopped at 02/05/21 1335   acetaminophen (TYLENOL) tablet 650 mg, 650 mg, Oral, Q6H PRN **OR** acetaminophen (TYLENOL) suppository 650 mg, 650 mg, Rectal, Q6H PRN, Rise Patience, MD   acyclovir (ZOVIRAX) 200 MG/5ML suspension SUSP 800 mg, 800 mg, Per Tube, 5 X Daily, McQuaid, Douglas B, MD, 800 mg at 02/06/21 1049   albuterol (PROVENTIL) (2.5 MG/3ML) 0.083% nebulizer solution 2.5 mg, 2.5 mg, Nebulization, Q4H PRN, Juanito Doom, MD   amitriptyline (ELAVIL) tablet 25 mg, 25 mg, Per Tube, QHS, Gleason, Otilio Carpen, PA-C, 25 mg at 02/05/21 2106   cefTRIAXone (ROCEPHIN) 2 g in sodium chloride 0.9 % 100 mL IVPB, 2 g, Intravenous, Q24H, Dimple Nanas, RPH, Stopped at 02/05/21 2255   chlorhexidine  gluconate (MEDLINE KIT) (PERIDEX) 0.12 % solution 15 mL, 15 mL, Mouth Rinse, BID, Candee Furbish, MD, 15 mL at 02/06/21 1058   Chlorhexidine Gluconate Cloth 2 % PADS 6 each, 6 each, Topical, Daily, Candee Furbish, MD, 6 each at 02/06/21 1058   dexamethasone (DECADRON) injection 6 mg, 6 mg, Intravenous, Daily, Gleason, Otilio Carpen, PA-C, 6 mg at 02/06/21 1048   docusate (COLACE) 50 MG/5ML liquid 100 mg, 100 mg, Per Tube, BID, Candee Furbish, MD, 100 mg at 02/04/21 0955   enoxaparin (LOVENOX) injection 40 mg, 40 mg, Subcutaneous, Q24H, Candee Furbish, MD, 40 mg at 02/06/21 1049   feeding supplement (PROSource TF) liquid 45 mL, 45 mL, Per Tube, BID, Candee Furbish, MD, 45 mL at 02/06/21 1048   feeding supplement (VITAL HIGH PROTEIN) liquid 1,000 mL, 1,000 mL, Per Tube, Continuous, Candee Furbish, MD, Last Rate: 50 mL/hr at 02/06/21 1042, 1,000 mL at 46/80/32 1224   folic acid (FOLVITE) tablet 1 mg, 1 mg, Per Tube, Daily, Green, Terri L, RPH, 1 mg at 02/06/21 1049   gabapentin (NEURONTIN) 250 MG/5ML solution 100 mg, 100 mg, Per Tube, TID, Gleason, Otilio Carpen, PA-C, 100 mg at 02/05/21 2256   HYDROmorphone (DILAUDID) 50 mg in sodium chloride 0.9 % 100 mL (0.5 mg/mL) infusion, 0.5-4 mg/hr, Intravenous, Continuous, Gleason, Otilio Carpen, PA-C, Last Rate: 5.5 mL/hr at 02/06/21 0800, 2.75 mg/hr at 02/06/21 0800  HYDROmorphone (DILAUDID) bolus via infusion 0.25-1 mg, 0.25-1 mg, Intravenous, Q30 min PRN, Gleason, Otilio Carpen, PA-C, 0.5 mg at 02/05/21 2053   insulin aspart (novoLOG) injection 0-9 Units, 0-9 Units, Subcutaneous, Q4H, Rise Patience, MD, 2 Units at 02/04/21 0403   levothyroxine (SYNTHROID) tablet 125 mcg, 125 mcg, Per Tube, Q0600, Minda Ditto, RPH, 125 mcg at 02/06/21 0530   MEDLINE mouth rinse, 15 mL, Mouth Rinse, 10 times per day, Candee Furbish, MD, 15 mL at 02/06/21 1050   melatonin tablet 3 mg, 3 mg, Per Tube, QHS, Green, Terri L, RPH, 3 mg at 02/05/21 2109   midazolam (VERSED) injection 2 mg,  2 mg, Intravenous, Q6H PRN, Gleason, Otilio Carpen, PA-C, 2 mg at 02/05/21 1259   midazolam (VERSED) injection 2 mg, 2 mg, Intravenous, Once, Gleason, Otilio Carpen, PA-C   multivitamin liquid 15 mL, 15 mL, Per Tube, Daily, Green, Terri L, RPH, 15 mL at 02/06/21 1049   pantoprazole (PROTONIX) injection 40 mg, 40 mg, Intravenous, QHS, Candee Furbish, MD, 40 mg at 02/05/21 2105   phenylephrine (NEO-SYNEPHRINE) 56m/NS 251mpremix infusion, 25-200 mcg/min, Intravenous, Titrated, SoAnders SimmondsMD, Last Rate: 90 mL/hr at 02/06/21 1048, 120 mcg/min at 02/06/21 1048   polyethylene glycol (MIRALAX / GLYCOLAX) packet 17 g, 17 g, Per Tube, Daily, SmCandee FurbishMD, 17 g at 02/04/21 0955   propofol (DIPRIVAN) 1000 MG/100ML infusion, 5-80 mcg/kg/min, Intravenous, Titrated, Gleason, LaOtilio CarpenPA-C, Last Rate: 11.94 mL/hr at 02/06/21 0800, 20 mcg/kg/min at 02/06/21 0800   QUEtiapine (SEROQUEL) tablet 100 mg, 100 mg, Per Tube, QHS, Gleason, LaOtilio CarpenPA-C, 100 mg at 02/05/21 2106   sodium chloride flush (NS) 0.9 % injection 10-40 mL, 10-40 mL, Intracatheter, Q12H, McSimonne Maffucci, MD, 10 mL at 02/05/21 0935   sodium chloride flush (NS) 0.9 % injection 10-40 mL, 10-40 mL, Intracatheter, PRN, McSimonne Maffucci, MD   sodium chloride flush (NS) 0.9 % injection 10-40 mL, 10-40 mL, Intracatheter, Q12H, McQuaid, Douglas B, MD, 10 mL at 02/06/21 1057   sodium chloride flush (NS) 0.9 % injection 10-40 mL, 10-40 mL, Intracatheter, PRN, McJuanito DoomMD   [COMPLETED] thiamine 50067mn normal saline (25m28mVPB, 500 mg, Intravenous, Q8H, Stopped at 02/05/21 0550 **FOLLOWED BY** thiamine (B-1) 250 mg in sodium chloride 0.9 % 50 mL IVPB, 250 mg, Intravenous, Daily **FOLLOWED BY** [START ON 02/12/2021] thiamine (B-1) injection 100 mg, 100 mg, Intravenous, Daily, KhalDonnetta Simpers Labs CBC    Component Value Date/Time   WBC 15.9 (H) 02/05/2021 1407   RBC 2.79 (L) 02/05/2021 1407   HGB 9.0 (L) 02/05/2021 1407   HCT  30.1 (L) 02/05/2021 1407   PLT 383 02/05/2021 1407   MCV 107.9 (H) 02/05/2021 1407   MCH 32.3 02/05/2021 1407   MCHC 29.9 (L) 02/05/2021 1407   RDW 14.7 02/05/2021 1407   LYMPHSABS 0.6 (L) 01/26/2021 0302   MONOABS 0.2 01/26/2021 0302   EOSABS 0.0 01/26/2021 0302   BASOSABS 0.1 01/26/2021 0302    CMP     Component Value Date/Time   NA 141 02/06/2021 0418   K 3.0 (L) 02/06/2021 0418   CL 111 02/06/2021 0418   CO2 22 02/06/2021 0418   GLUCOSE 146 (H) 02/06/2021 0418   BUN 78 (H) 02/06/2021 0418   CREATININE 0.98 02/06/2021 0418   CALCIUM 7.9 (L) 02/06/2021 0418   PROT 5.6 (L) 02/03/2021 1446   ALBUMIN 2.2 (L) 02/03/2021 1446   AST 23 02/03/2021 1446  ALT 14 02/03/2021 1446   ALKPHOS 48 02/03/2021 1446   BILITOT 2.2 (H) 02/03/2021 1446   GFRNONAA >60 02/06/2021 0418   Imaging I have reviewed images in epic and the results pertinent to this consultation are: MR Brain x2 in the past 8 days- no acute chages. Chornic Right PCA infarct. 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 HLD, hypothyrodisim, coming in for eval of AMS. Found to have Pneumococcal pneumonia for which she is being treated. Became agitated and hypoxic requiring intubation. Mentation seems to be far from baseline. Work up to include EEG, MR and LP all unremarkable. Question of viral lesions on the back c/f HSV - but CSF had zero WBCs making HSV unlikely. A psychiatric etiology should be kept in the differential but she does not have an extensive history pointing. Limited ceribell EEG in the past and a routine EEG negative for seizures. Will get MRI C spine without cotnrast, does not explain encephalopathy but noted to have hyperreflexia on exam.  Recommendations: cEEG today MRI C spine w/o contrast. We will continue to follow.  [place] Pager Number [phone]

## 2021-02-06 NOTE — Progress Notes (Signed)
NAME:  Rhonda Winters, MRN:  [id number], DOB:  [birth year], LOS: 67 ADMISSION DATE:  01/25/2021, CONSULTATION DATE:  01/27/21 REFERRING MD:  Florene Glen, CHIEF COMPLAINT:  AMS   BRIEF  75 year old woman w/ hx of metabolic syndrome presenting with increasing confusion.  Workup has revealed RUL Pneumonia and pneumococcal bacteremia.  Unfortunately became more agitated through day, given ativan but then became obtunded with increased WOB.  Initial dose flumazenil ineffective.  Transferred to ICU and PCCM consulted.     Pertinent  Medical History  Hypothyroidism HTN Prior stroke T2DM  Significant Hospital Events: Including procedures, antibiotic start and stop dates in addition to other pertinent events   11/29 admitted 11/30 decompensation, intubation.  Attempted LP. 12/1 seizure-like activity, Ceribell in progress.  AED's initiated for seizure like movement.  IR LP. CT Head with no acute intracranial abnormalities, chronic small vessel disease, chronic R PCA territory infarct 12.2 - Afebrile . RN reports no seizure activity overnight.  Propofol turned off this am, remains on 50 mcg fentanyl. D5 turned off with hyperglycemia/ Pt waking, following simple commands intermittently, purposeful movement . Blood cultures with pan sensitive strep pneumo. Vent - 40-50% FiO2, PEEP 5 Glucose range 177-185 12/3 - given lasix yesterday -> then soft BP -> needed fluids. Had SVT ->on cardizem and resolved. On 50% fio2. On fent gtt/diprivan gtt (prior intolreance to precedex) -> agitated encephalopathy +. Afebrile 12/5 SBT done on prop/fent and failed 2/2 small TV, trial Precedex again.   Failed extubation->reintubated 12/6 No overnight events, calm on Precedex/Fentanyl 12/7 Diuresed well, overnight hypotension with transient pressors resolved this AM, no improvement in encephalopathy, vesicular lesions found on sacrum yesterday, viral cultures pending 12/8 Continue to remains significantly encephalopathic. Eyes  are spontaneously open and movement seen in all extremities but she will no track and unable to follow any commands on dilaudid and propofol drip   Interim History / Subjective:  No events.  Transferred to [facility] for VEEG monitoring.  Objective   Blood pressure (!) 131/48, pulse 92, temperature 98.5 F (36.9 C), temperature source Oral, resp. rate 14, height 5\' 2"  (1.575 m), weight 98.8 kg, SpO2 100 %.    Vent Mode: PRVC FiO2 (%):  [30 %] 30 % Set Rate:  [26 bmp] 26 bmp Vt Set:  [400 mL] 400 mL PEEP:  [5 cmH20] 5 cmH20 Plateau Pressure:  [13 cmH20-21 cmH20] 13 cmH20   Intake/Output Summary (Last 24 hours) at 02/06/2021 0931 Last data filed at 02/06/2021 0800 Gross per 24 hour  Intake 2545.77 ml  Output 2455 ml  Net 90.77 ml    Filed Weights   02/03/21 0400 02/04/21 0500 02/06/21 0500  Weight: 99.5 kg 97.3 kg 98.8 kg   Physical Exam   No distress Opens eyes to voice, seems to almost try to track to voice No movement in ext x 4 Pupils reactive but not tracking Lungs clear Ext warm, mild anasarca   Resolved Hospital Problem list   AKI  -resolved Seizure Like Activity   -Noted on 11/30, Ceribell negative 11/30.  Repeated on 12/1 negative as well.  Assessment & Plan:   Acute Hypoxemic Respiratory Failure secondary to Pneumococcal PNA  Pneumococcal PNA with Bacteremia (POA) Acute Metabolic Encephalopathy- persistent, LP/MRI brain not explaining.  Ceribell neg x 2, here for LTVEEG Vesicular Lesions- -As noted husband says pt develops these 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, treats with acyclovir.  HSV PCR from lesions pending Urinary incontinence- External urinary cath  DM  II with Hyperglycemia  At Risk Malnutrition  Hypo-K: repleted  - Continue vent support, VAP prevention bundle, mental status precludes extubation at present - I would consider repeat MRI brain and MRI C spine in addition to VEEG, will d/w neurology - Continue acyclovir/rocephin, duration  TBD - Will probably need further diuresis at some point to help liberate from vent - Probably needs trach, will discuss with family after we get more information from neurological studies  Best Practice (right click and "Reselect all SmartList Selections" daily)  Diet/type: TF DVT prophylaxis: LMWH  GI prophylaxis: PPI Lines: N/A Foley:  N/A Code Status:  full code Last date of multidisciplinary goals of care discussion: Continue to update family regarding plan of care.   Critical care time: 35 minutes   Performed by: Candee Furbish

## 2021-02-06 NOTE — Progress Notes (Signed)
Pt arrived via [facility] from [facility]. Pt assessed, CHG bath given, skin assessed with Mable Fill, Agricultural consultant. Pt with stable VS on vent. Dr. Tamala Julian to room to assess pt.   0930 Pt's BP starting to trend down. Neo titrated per order, WCTM. Neuro called to inform pt had arrived, [facility].   1000 Neo still being titrated up, husband at bedside with neuro, WCTM.

## 2021-02-07 ENCOUNTER — Inpatient Hospital Stay (HOSPITAL_COMMUNITY): Payer: Medicare Other

## 2021-02-07 DIAGNOSIS — R4182 Altered mental status, unspecified: Secondary | ICD-10-CM | POA: Diagnosis not present

## 2021-02-07 DIAGNOSIS — J9601 Acute respiratory failure with hypoxia: Secondary | ICD-10-CM | POA: Diagnosis not present

## 2021-02-07 DIAGNOSIS — A419 Sepsis, unspecified organism: Secondary | ICD-10-CM | POA: Diagnosis not present

## 2021-02-07 DIAGNOSIS — R451 Restlessness and agitation: Secondary | ICD-10-CM

## 2021-02-07 DIAGNOSIS — G9340 Encephalopathy, unspecified: Secondary | ICD-10-CM | POA: Diagnosis not present

## 2021-02-07 LAB — GLUCOSE, CAPILLARY
Glucose-Capillary: 138 mg/dL — ABNORMAL HIGH (ref 70–99)
Glucose-Capillary: 158 mg/dL — ABNORMAL HIGH (ref 70–99)
Glucose-Capillary: 159 mg/dL — ABNORMAL HIGH (ref 70–99)
Glucose-Capillary: 187 mg/dL — ABNORMAL HIGH (ref 70–99)
Glucose-Capillary: 193 mg/dL — ABNORMAL HIGH (ref 70–99)
Glucose-Capillary: 231 mg/dL — ABNORMAL HIGH (ref 70–99)

## 2021-02-07 LAB — BASIC METABOLIC PANEL
Anion gap: 6 (ref 5–15)
BUN: 50 mg/dL — ABNORMAL HIGH (ref 8–23)
CO2: 24 mmol/L (ref 22–32)
Calcium: 8.8 mg/dL — ABNORMAL LOW (ref 8.9–10.3)
Chloride: 115 mmol/L — ABNORMAL HIGH (ref 98–111)
Creatinine, Ser: 0.61 mg/dL (ref 0.44–1.00)
GFR, Estimated: >60 mL/min (ref >60)
Glucose, Bld: 143 mg/dL — ABNORMAL HIGH (ref 70–99)
Potassium: 3.8 mmol/L (ref 3.5–5.1)
Sodium: 145 mmol/L (ref 135–145)

## 2021-02-07 LAB — CBC
HCT: 25.5 % — ABNORMAL LOW (ref 36.0–46.0)
Hemoglobin: 7.9 g/dL — ABNORMAL LOW (ref 12.0–15.0)
MCH: 33.6 pg (ref 26.0–34.0)
MCHC: 31 g/dL (ref 30.0–36.0)
MCV: 108.5 fL — ABNORMAL HIGH (ref 80.0–100.0)
Platelets: 342 10*3/uL (ref 150–400)
RBC: 2.35 MIL/uL — ABNORMAL LOW (ref 3.87–5.11)
RDW: 14.6 % (ref 11.5–15.5)
WBC: 14.3 10*3/uL — ABNORMAL HIGH (ref 4.0–10.5)
nRBC: 0 % (ref 0.0–0.2)

## 2021-02-07 LAB — TRIGLYCERIDES: Triglycerides: 327 mg/dL — ABNORMAL HIGH (ref <150)

## 2021-02-07 LAB — ABO/RH: ABO/RH(D): A POS

## 2021-02-07 LAB — MAGNESIUM: Magnesium: 2 mg/dL (ref 1.7–2.4)

## 2021-02-07 LAB — PHOSPHORUS: Phosphorus: 2.4 mg/dL — ABNORMAL LOW (ref 2.5–4.6)

## 2021-02-07 MED ORDER — MIDAZOLAM HCL 2 MG/2ML IJ SOLN
5.0000 mg | Freq: Once | INTRAMUSCULAR | Status: AC
  Administered 2021-02-07: 4 mg via INTRAVENOUS
  Filled 2021-02-07: qty 6

## 2021-02-07 MED ORDER — NOREPINEPHRINE 4 MG/250ML-% IV SOLN: 0.0000 ug/min | INTRAVENOUS | Status: DC

## 2021-02-07 MED ORDER — LORAZEPAM 2 MG/ML IJ SOLN
INTRAMUSCULAR | Status: AC
  Administered 2021-02-07: 2 mg
  Filled 2021-02-07: qty 1

## 2021-02-07 MED ORDER — ROCURONIUM BROMIDE 10 MG/ML (PF) SYRINGE
100.0000 mg | PREFILLED_SYRINGE | Freq: Once | INTRAVENOUS | Status: AC
  Administered 2021-02-07: 100 mg via INTRAVENOUS
  Filled 2021-02-07: qty 10

## 2021-02-07 MED ORDER — LORAZEPAM 2 MG/ML IJ SOLN: 2.0000 mg | Freq: Once | INTRAMUSCULAR | Status: DC

## 2021-02-07 MED ORDER — LABETALOL HCL 5 MG/ML IV SOLN
10.0000 mg | INTRAVENOUS | Status: DC | PRN
  Administered 2021-02-07 – 2021-02-09 (×7): 10 mg via INTRAVENOUS
  Filled 2021-02-07 (×8): qty 4

## 2021-02-07 MED ORDER — ETOMIDATE 2 MG/ML IV SOLN
20.0000 mg | Freq: Once | INTRAVENOUS | Status: AC
  Administered 2021-02-07: 20 mg via INTRAVENOUS
  Filled 2021-02-07: qty 10

## 2021-02-07 MED ORDER — FENTANYL CITRATE PF 50 MCG/ML IJ SOSY
200.0000 ug | PREFILLED_SYRINGE | Freq: Once | INTRAMUSCULAR | Status: AC
  Administered 2021-02-07: 200 ug via INTRAVENOUS
  Filled 2021-02-07: qty 4

## 2021-02-07 MED ORDER — GADOBUTROL 1 MMOL/ML IV SOLN
10.0000 mL | Freq: Once | INTRAVENOUS | Status: AC | PRN
  Administered 2021-02-07: 10 mL via INTRAVENOUS

## 2021-02-07 NOTE — Procedures (Signed)
Bedside Tracheostomy Insertion Procedure Note   Patient Details:   Name: Rhonda Winters DOB: 1945/08/13 MRN: [id number]  Procedure: Tracheostomy  Pre Procedure Assessment: ET Tube Size: ET Tube secured at lip (cm): Bite block in place: No Breath Sounds: Clear  Post Procedure Assessment: BP (!) 183/64   Pulse 85   Temp 99.9 F (37.7 C) (Axillary)   Resp (!) 32   Ht 5\' 2"  (1.575 m)   Wt 102.7 kg   SpO2 97%   BMI 41.41 kg/m  O2 sats: stable throughout Complications: No apparent complications Patient did tolerate procedure well Tracheostomy Brand:Shiley  XLT Proximal Tracheostomy Style:Cuffed Tracheostomy Size:  6.0 Tracheostomy Secured RCB:ULAGTXM Tracheostomy Placement Confirmation:Trach cuff visualized and in place, good return vT,   Noted small amount of bloody oozing around stoma site, MD aware.    Lenna Sciara 02/07/2021, 4:08 PM

## 2021-02-07 NOTE — Consult Note (Addendum)
[facility] Face-to-Face Psychiatry Consult   Reason for Consult:  AMS r/o catatonia Referring Physician:  Dr. Ina Homes   Patient Identification: Rhonda Winters MRN:  [id number] Principal Diagnosis: Sepsis ([place]) Diagnosis:  Principal Problem:   Sepsis ([place]) Active Problems:   HTN (hypertension)   Diabetic neuropathy associated with type 2 diabetes mellitus ([place])   Hypothyroidism   CVA (cerebral vascular accident) ([place])   Pneumonia   Acute encephalopathy   ARF (acute renal failure) ([place])   Streptococcal pneumonia ([place])   Acute respiratory failure with hypoxia ([place])   AMS (altered mental status)   Total Time spent with patient: 50 minutes  HPI: 75 year old woman w/ hx of metabolic syndrome presenting with increasing confusion. Workup has revealed RUL Pneumonia and pneumococcal bacteremia.  Unfortunately became more agitated through day, given ativan but then became obtunded with increased WOB.  Initial dose flumazenil ineffective.  Transferred to ICU and PCCM consulted.    Significant hospital events: 11/29 admitted 11/30 decompensation, intubation.  Attempted LP. 12/1 seizure-like activity, Ceribell in progress.  AED's initiated for seizure like movement.  IR LP. CT Head with no acute intracranial abnormalities, chronic small vessel disease, chronic R PCA territory infarct 12.2 - Afebrile . RN reports no seizure activity overnight.  Propofol turned off this am, remains on 50 mcg fentanyl. D5 turned off with hyperglycemia/ Pt waking, following simple commands intermittently, purposeful movement . Blood cultures with pan sensitive strep pneumo. Vent - 40-50% FiO2, PEEP 5 Glucose range 177-185 12/3 - given lasix yesterday -> then soft BP -> needed fluids. Had SVT ->on cardizem and resolved. On 50% fio2. On fent gtt/diprivan gtt (prior intolreance to precedex) -> agitated encephalopathy +. Afebrile 12/5 SBT done on prop/fent and failed 2/2 small TV, trial Precedex again.   Failed  extubation->reintubated 12/6 No overnight events, calm on Precedex/Fentanyl 12/7 Diuresed well, overnight hypotension with transient pressors resolved this AM, no improvement in encephalopathy, vesicular lesions found on sacrum yesterday, viral cultures pending 12/8 Continue to remains significantly encephalopathic. Eyes are spontaneously open and movement seen in all extremities but she will no track and unable to follow any commands on dilaudid and propofol drip   Psychiatry was consulted for AMS and r/o catatonia.   On my evaluation today, pt is intubated, sedated and does not participate in subjective portion of interview. Due to this, pt was unable to comment on symptoms of axis 1 disorders, such as mood, anxiety, psychotic symptoms, or trauma.  Based on my chart review, it is apparent that patient is delirious.  I attempted to administer CAM for ICU assessment, and pt did not squeeze hand on command for for any of the "A" letters on assessment.   For catatonia assessment, Pt was stuporous and immobile, mute (due to not awake), not staring, no posturing, no grimacing, no echopraxia or echolalia, no stereotypy,  no mannerisms, no verbigeration, no rigidity, negativism could not be assessed, no waxy flxibility,  withdrawal could not be assessed, no excitement, no impulsivity, no automatic obedience, no passive obedience, mo muscle resistance, no ambediendency, grasp reflex not tested due to mits, perseveration could not be assessed, no combativeness, and there was autonomic abnormality due to illness.   Per nursing: Pt was agitated, stiff and hyperreflexic yesterday, and administered Ativan, and pt has been sedated. Pt does not follow commands.  Plan for trach today.        Past Psychiatric History: unknown, pt was sedated. Pt is on seroquel, amitriptyline melatonin, and gabapentin in  the hospital.   Risk to Self:  no Risk to Others:  no Prior Inpatient Therapy:  unknown Prior Outpatient  Therapy:  unknown  Past Medical History:  Past Medical History:  Diagnosis Date   Acquired hypothyroidism    Hypertension    Stroke ([place])    Type 2 diabetes mellitus ([place])     Past Surgical History:  Procedure Laterality Date   LOOP RECORDER INSERTION N/A 07/01/2020   Procedure: LOOP RECORDER INSERTION;  Surgeon: Constance Haw, MD;  Location: [facility];  Service: Cardiovascular;  Laterality: N/A;   Family History: History reviewed. No pertinent family history. Family Psychiatric  History: unknown, pt was sedated.  Social History:  Social History   Substance and Sexual Activity  Alcohol Use Yes     Social History   Substance and Sexual Activity  Drug Use Never    Social History   Socioeconomic History   Marital status: Married    Spouse name: Not on file   Number of children: Not on file   Years of education: Not on file   Highest education level: Not on file  Occupational History   Not on file  Tobacco Use   Smoking status: Former    Types: Cigarettes    Quit date: 09/17/1988    Years since quitting: 32.4   Smokeless tobacco: Never  Substance and Sexual Activity   Alcohol use: Yes   Drug use: Never   Sexual activity: Not on file  Other Topics Concern   Not on file  Social History Narrative   Not on file   Social Determinants of Health   Financial Resource Strain: Not on file  Food Insecurity: Not on file  Transportation Needs: Not on file  Physical Activity: Not on file  Stress: Not on file  Social Connections: Not on file   Additional Social History:    Allergies:   Allergies  Allergen Reactions   Hydrocodone-Acetaminophen Nausea And Vomiting    Labs:  Results for orders placed or performed during the hospital encounter of 01/25/21 (from the past 48 hour(s))  Glucose, capillary     Status: Abnormal   Collection Time: 02/05/21  1:42 PM  Result Value Ref Range   Glucose-Capillary 122 (H) 70 - 99 mg/dL    Comment: Glucose  reference range applies only to samples taken after fasting for at least 8 hours.   Comment 1 Notify RN    Comment 2 Document in Chart   Troponin I (High Sensitivity)     Status: None   Collection Time: 02/05/21  2:03 PM  Result Value Ref Range   Troponin I (High Sensitivity) 8 <18 ng/L    Comment: (NOTE) Elevated high sensitivity troponin I (hsTnI) values and significant  changes across serial measurements may suggest ACS but many other  chronic and acute conditions are known to elevate hsTnI results.  Refer to the "Links" section for chest pain algorithms and additional  guidance. Performed at [facility], [place] [address] [place], [place] [address]   Basic metabolic panel     Status: Abnormal   Collection Time: 02/05/21  2:07 PM  Result Value Ref Range   Sodium 140 135 - 145 mmol/L   Potassium 4.0 3.5 - 5.1 mmol/L   Chloride 106 98 - 111 mmol/L   CO2 23 22 - 32 mmol/L   Glucose, Bld 274 (H) 70 - 99 mg/dL    Comment: Glucose reference range applies only to samples taken  after fasting for at least 8 hours.   BUN 84 (H) 8 - 23 mg/dL   Creatinine, Ser 1.46 (H) 0.44 - 1.00 mg/dL   Calcium 9.1 8.9 - 10.3 mg/dL   GFR, Estimated 37 (L) >60 mL/min    Comment: (NOTE) Calculated using the CKD-EPI Creatinine Equation (2021)    Anion gap 11 5 - 15    Comment: Performed at [facility], [place] [address], [place], [place] [address]  CBC     Status: Abnormal   Collection Time: 02/05/21  2:07 PM  Result Value Ref Range   WBC 15.9 (H) 4.0 - 10.5 K/uL   RBC 2.79 (L) 3.87 - 5.11 MIL/uL   Hemoglobin 9.0 (L) 12.0 - 15.0 g/dL   HCT 30.1 (L) 36.0 - 46.0 %   MCV 107.9 (H) 80.0 - 100.0 fL   MCH 32.3 26.0 - 34.0 pg   MCHC 29.9 (L) 30.0 - 36.0 g/dL   RDW 14.7 11.5 - 15.5 %   Platelets 383 150 - 400 K/uL   nRBC 0.0 0.0 - 0.2 %    Comment: Performed at [facility], [place] [address], [place], [place] [address]  Glucose, capillary     Status:  Abnormal   Collection Time: 02/05/21  4:30 PM  Result Value Ref Range   Glucose-Capillary 120 (H) 70 - 99 mg/dL    Comment: Glucose reference range applies only to samples taken after fasting for at least 8 hours.   Comment 1 Notify RN    Comment 2 Document in Chart   Glucose, capillary     Status: None   Collection Time: 02/05/21  7:53 PM  Result Value Ref Range   Glucose-Capillary 88 70 - 99 mg/dL    Comment: Glucose reference range applies only to samples taken after fasting for at least 8 hours.   Comment 1 Notify RN    Comment 2 Document in Chart   Glucose, capillary     Status: None   Collection Time: 02/06/21 12:07 AM  Result Value Ref Range   Glucose-Capillary 70 70 - 99 mg/dL    Comment: Glucose reference range applies only to samples taken after fasting for at least 8 hours.  Glucose, capillary     Status: None   Collection Time: 02/06/21  1:05 AM  Result Value Ref Range   Glucose-Capillary 99 70 - 99 mg/dL    Comment: Glucose reference range applies only to samples taken after fasting for at least 8 hours.  Glucose, capillary     Status: Abnormal   Collection Time: 02/06/21  3:43 AM  Result Value Ref Range   Glucose-Capillary 65 (L) 70 - 99 mg/dL    Comment: Glucose reference range applies only to samples taken after fasting for at least 8 hours.   Comment 1 Notify RN    Comment 2 Document in Chart   Glucose, capillary     Status: Abnormal   Collection Time: 02/06/21  4:00 AM  Result Value Ref Range   Glucose-Capillary 113 (H) 70 - 99 mg/dL    Comment: Glucose reference range applies only to samples taken after fasting for at least 8 hours.  Basic metabolic panel     Status: Abnormal   Collection Time: 02/06/21  4:18 AM  Result Value Ref Range   Sodium 141 135 - 145 mmol/L   Potassium 3.0 (L) 3.5 - 5.1 mmol/L    Comment: DELTA CHECK NOTED   Chloride 111 98 - 111  mmol/L   CO2 22 22 - 32 mmol/L   Glucose, Bld 146 (H) 70 - 99 mg/dL    Comment: Glucose reference  range applies only to samples taken after fasting for at least 8 hours.   BUN 78 (H) 8 - 23 mg/dL   Creatinine, Ser 0.98 0.44 - 1.00 mg/dL   Calcium 7.9 (L) 8.9 - 10.3 mg/dL   GFR, Estimated >60 >60 mL/min    Comment: (NOTE) Calculated using the CKD-EPI Creatinine Equation (2021)    Anion gap 8 5 - 15    Comment: Performed at [facility], [place] [address], [place], [place] [address]  Glucose, capillary     Status: None   Collection Time: 02/06/21  9:39 AM  Result Value Ref Range   Glucose-Capillary 73 70 - 99 mg/dL    Comment: Glucose reference range applies only to samples taken after fasting for at least 8 hours.  Glucose, capillary     Status: Abnormal   Collection Time: 02/06/21 12:19 PM  Result Value Ref Range   Glucose-Capillary 164 (H) 70 - 99 mg/dL    Comment: Glucose reference range applies only to samples taken after fasting for at least 8 hours.  Glucose, capillary     Status: Abnormal   Collection Time: 02/06/21  5:03 PM  Result Value Ref Range   Glucose-Capillary 215 (H) 70 - 99 mg/dL    Comment: Glucose reference range applies only to samples taken after fasting for at least 8 hours.  Glucose, capillary     Status: Abnormal   Collection Time: 02/06/21  7:44 PM  Result Value Ref Range   Glucose-Capillary 161 (H) 70 - 99 mg/dL    Comment: Glucose reference range applies only to samples taken after fasting for at least 8 hours.  Glucose, capillary     Status: Abnormal   Collection Time: 02/06/21 11:42 PM  Result Value Ref Range   Glucose-Capillary 111 (H) 70 - 99 mg/dL    Comment: Glucose reference range applies only to samples taken after fasting for at least 8 hours.  Triglycerides     Status: Abnormal   Collection Time: 02/07/21  3:52 AM  Result Value Ref Range   Triglycerides 327 (H) <150 mg/dL    Comment: Performed at [facility] [address], [place], [place] [address]  Basic metabolic panel     Status: Abnormal   Collection Time:  02/07/21  3:52 AM  Result Value Ref Range   Sodium 145 135 - 145 mmol/L   Potassium 3.8 3.5 - 5.1 mmol/L   Chloride 115 (H) 98 - 111 mmol/L   CO2 24 22 - 32 mmol/L   Glucose, Bld 143 (H) 70 - 99 mg/dL    Comment: Glucose reference range applies only to samples taken after fasting for at least 8 hours.   BUN 50 (H) 8 - 23 mg/dL   Creatinine, Ser 0.61 0.44 - 1.00 mg/dL   Calcium 8.8 (L) 8.9 - 10.3 mg/dL   GFR, Estimated >60 >60 mL/min    Comment: (NOTE) Calculated using the CKD-EPI Creatinine Equation (2021)    Anion gap 6 5 - 15    Comment: Performed at [facility] [address], [place] [address]  CBC     Status: Abnormal   Collection Time: 02/07/21  3:52 AM  Result Value Ref Range   WBC 14.3 (H) 4.0 - 10.5 K/uL   RBC 2.35 (L) 3.87 - 5.11 MIL/uL  Hemoglobin 7.9 (L) 12.0 - 15.0 g/dL   HCT 25.5 (L) 36.0 - 46.0 %   MCV 108.5 (H) 80.0 - 100.0 fL   MCH 33.6 26.0 - 34.0 pg   MCHC 31.0 30.0 - 36.0 g/dL   RDW 14.6 11.5 - 15.5 %   Platelets 342 150 - 400 K/uL   nRBC 0.0 0.0 - 0.2 %    Comment: Performed at [facility] [address] [place], [place] [address]  Magnesium     Status: None   Collection Time: 02/07/21  3:52 AM  Result Value Ref Range   Magnesium 2.0 1.7 - 2.4 mg/dL    Comment: Performed at [facility] [address], [place], [place] [address]  Phosphorus     Status: Abnormal   Collection Time: 02/07/21  3:52 AM  Result Value Ref Range   Phosphorus 2.4 (L) 2.5 - 4.6 mg/dL    Comment: Performed at [facility] [address] [place], [place] [address]  Glucose, capillary     Status: Abnormal   Collection Time: 02/07/21  3:56 AM  Result Value Ref Range   Glucose-Capillary 138 (H) 70 - 99 mg/dL    Comment: Glucose reference range applies only to samples taken after fasting for at least 8 hours.  Glucose, capillary     Status: Abnormal   Collection Time: 02/07/21  7:21 AM  Result Value Ref Range   Glucose-Capillary 159 (H)  70 - 99 mg/dL    Comment: Glucose reference range applies only to samples taken after fasting for at least 8 hours.  Glucose, capillary     Status: Abnormal   Collection Time: 02/07/21 11:23 AM  Result Value Ref Range   Glucose-Capillary 158 (H) 70 - 99 mg/dL    Comment: Glucose reference range applies only to samples taken after fasting for at least 8 hours.    Current Facility-Administered Medications  Medication Dose Route Frequency Provider Last Rate Last Admin   0.9 %  sodium chloride infusion   Intravenous Continuous Juanito Doom, MD   Stopped at 02/02/21 2325   0.9 %  sodium chloride infusion  250 mL Intravenous Continuous Anders Simmonds, MD   Stopped at 02/05/21 1335   acetaminophen (TYLENOL) tablet 650 mg  650 mg Oral Q6H PRN Rise Patience, MD       Or   acetaminophen (TYLENOL) suppository 650 mg  650 mg Rectal Q6H PRN Rise Patience, MD       acyclovir (ZOVIRAX) 200 MG/5ML suspension SUSP 800 mg  800 mg Per Tube 5 X Daily Simonne Maffucci B, MD   800 mg at 02/07/21 0553   albuterol (PROVENTIL) (2.5 MG/3ML) 0.083% nebulizer solution 2.5 mg  2.5 mg Nebulization Q4H PRN Juanito Doom, MD       amitriptyline (ELAVIL) tablet 25 mg  25 mg Per Tube QHS Gleason, Otilio Carpen, PA-C   25 mg at 02/06/21 2115   cefTRIAXone (ROCEPHIN) 2 g in sodium chloride 0.9 % 100 mL IVPB  2 g Intravenous Q24H Dimple Nanas, RPH 200 mL/hr at 02/06/21 2119 2 g at 02/06/21 2119   chlorhexidine gluconate (MEDLINE KIT) (PERIDEX) 0.12 % solution 15 mL  15 mL Mouth Rinse BID Candee Furbish, MD   15 mL at 02/06/21 1956   Chlorhexidine Gluconate Cloth 2 % PADS 6 each  6 each Topical Daily Candee Furbish, MD   6 each at 02/06/21 1058   dexamethasone (DECADRON)  injection 6 mg  6 mg Intravenous Daily Gleason, Otilio Carpen, PA-C   6 mg at 02/07/21 1042   docusate (COLACE) 50 MG/5ML liquid 100 mg  100 mg Per Tube BID Candee Furbish, MD   100 mg at 02/06/21 2115   enoxaparin (LOVENOX) injection 40  mg  40 mg Subcutaneous Q24H Candee Furbish, MD   40 mg at 02/06/21 1049   etomidate (AMIDATE) injection 20 mg  20 mg Intravenous Once Candee Furbish, MD       feeding supplement (PROSource TF) liquid 45 mL  45 mL Per Tube BID Candee Furbish, MD   45 mL at 02/06/21 2115   feeding supplement (VITAL HIGH PROTEIN) liquid 1,000 mL  1,000 mL Per Tube Continuous Candee Furbish, MD 50 mL/hr at 02/06/21 1042 1,000 mL at 02/06/21 1042   fentaNYL (SUBLIMAZE) injection 200 mcg  200 mcg Intravenous Once Candee Furbish, MD       folic acid (FOLVITE) tablet 1 mg  1 mg Per Tube Daily Minda Ditto, RPH   1 mg at 02/06/21 1049   gabapentin (NEURONTIN) 250 MG/5ML solution 100 mg  100 mg Per Tube TID Gleason, Otilio Carpen, PA-C   100 mg at 02/06/21 2116   HYDROmorphone (DILAUDID) 50 mg in sodium chloride 0.9 % 100 mL (0.5 mg/mL) infusion  0.5-4 mg/hr Intravenous Continuous Gleason, Otilio Carpen, PA-C 3.5 mL/hr at 02/07/21 1132 1.75 mg/hr at 02/07/21 1132   HYDROmorphone (DILAUDID) bolus via infusion 0.25-1 mg  0.25-1 mg Intravenous Q30 min PRN Gleason, Otilio Carpen, PA-C   0.5 mg at 02/05/21 2053   insulin aspart (novoLOG) injection 0-9 Units  0-9 Units Subcutaneous Q4H Rise Patience, MD   2 Units at 02/07/21 1152   labetalol (NORMODYNE) injection 10 mg  10 mg Intravenous Q2H PRN Candee Furbish, MD       levothyroxine (SYNTHROID) tablet 125 mcg  125 mcg Per Tube Q0600 Minda Ditto, RPH   125 mcg at 02/07/21 0553   LORazepam (ATIVAN) injection 2 mg  2 mg Intravenous Once Freemon Binford, Ovid Curd, MD       MEDLINE mouth rinse  15 mL Mouth Rinse 10 times per day Candee Furbish, MD   15 mL at 02/07/21 1006   melatonin tablet 3 mg  3 mg Per Tube QHS Minda Ditto, RPH   3 mg at 02/06/21 2116   midazolam (VERSED) injection 2 mg  2 mg Intravenous Q6H PRN Gleason, Otilio Carpen, PA-C   2 mg at 02/05/21 1259   midazolam (VERSED) injection 2 mg  2 mg Intravenous Once Gleason, Otilio Carpen, PA-C       midazolam (VERSED) injection 5 mg  5 mg  Intravenous Once Candee Furbish, MD       multivitamin liquid 15 mL  15 mL Per Tube Daily Green, Terri L, RPH   15 mL at 02/06/21 1049   norepinephrine (LEVOPHED) 57m in 2561m(0.016 mg/mL) premix infusion  0-40 mcg/min Intravenous Titrated SmCandee FurbishMD       pantoprazole (PROTONIX) injection 40 mg  40 mg Intravenous QHS SmCandee FurbishMD   40 mg at 02/06/21 2115   phenylephrine (NEO-SYNEPHRINE) 2043mS 250m70memix infusion  25-200 mcg/min Intravenous Titrated SommAnders Simmonds 18.75 mL/hr at 02/07/21 0620 25 mcg/min at 02/07/21 0620   polyethylene glycol (MIRALAX / GLYCOLAX) packet 17 g  17 g Per Tube Daily SmitCandee Furbish   17 g  at 02/04/21 0955   propofol (DIPRIVAN) 1000 MG/100ML infusion  5-80 mcg/kg/min Intravenous Titrated Gleason, Otilio Carpen, PA-C 14.93 mL/hr at 02/07/21 1121 25 mcg/kg/min at 02/07/21 1121   QUEtiapine (SEROQUEL) tablet 100 mg  100 mg Per Tube QHS Gleason, Otilio Carpen, PA-C   100 mg at 02/06/21 2115   rocuronium bromide 10 mg/mL (PF) syringe  100 mg Intravenous Once Candee Furbish, MD       sodium chloride flush (NS) 0.9 % injection 10-40 mL  10-40 mL Intracatheter Q12H Juanito Doom, MD   10 mL at 02/06/21 2118   sodium chloride flush (NS) 0.9 % injection 10-40 mL  10-40 mL Intracatheter PRN Juanito Doom, MD       thiamine (B-1) 250 mg in sodium chloride 0.9 % 50 mL IVPB  250 mg Intravenous Daily Donnetta Simpers, MD 100 mL/hr at 02/07/21 1107 250 mg at 02/07/21 1107   Followed by   Derrill Memo ON 02/12/2021] thiamine (B-1) injection 100 mg  100 mg Intravenous Daily Donnetta Simpers, MD        Musculoskeletal: Strength & Muscle Tone: on my exam while pt was sedated, no rigidity noted b/l UE Gait & Station: pt sedated laying in bed Patient leans: N/A            Psychiatric Specialty Exam:  Presentation  General Appearance: -- (sedated)  Eye Contact:-- (sedated)  Speech:No data recorded Speech Volume:-- (sedated)  Handedness:No  data recorded  Mood and Affect  Mood:-- (sedated)  Affect:-- (sedated)   Thought Process  Thought Processes:-- (sedated)  Descriptions of Associations:-- (sedated)  Orientation:None  Thought Content:-- (sedated)  History of Schizophrenia/Schizoaffective disorder:No data recorded Duration of Psychotic Symptoms:No data recorded Hallucinations:Hallucinations: -- (sedated)  Ideas of Reference:-- (sedated)  Suicidal Thoughts:Suicidal Thoughts: -- (sedated)  Homicidal Thoughts:Homicidal Thoughts: -- (sedated)   Sensorium  Memory:-- (sedated)  Judgment:Other (comment)  Insight:Other (comment)   Executive Functions  Concentration:Other (comment)  Attention Span:Other (comment)  Recall:Other (comment)  Fund of Knowledge:Other (comment)  Language:Other (comment)   Psychomotor Activity  Psychomotor Activity:Psychomotor Activity: -- (no rigidity, waxy flexibility or posturing)   Assets  Assets:No data recorded  Sleep  Sleep:Sleep: -- (sedated)   Physical Exam: Physical Exam Vitals reviewed.  Constitutional:      General: She is not in acute distress.  Review of Systems  Reason unable to perform ROS: pt sedated cannot perform ROS.   Blood pressure (!) 153/91, pulse (!) 127, temperature 99.2 F (37.3 C), temperature source Axillary, resp. rate (!) 32, height 5' 2"  (1.575 m), weight 102.7 kg, SpO2 100 %. Body mass index is 41.41 kg/m.  Treatment Plan Summary: Daily contact with patient to assess and evaluate symptoms and progress in treatment, Medication management, and Plan :  Assessment: Delirium R/o Catatonia due to medical illness  Plan:  Re-assess for catatonia when pt is not sedated. Plan for transition to trach today. Recommend ativan challenge again tomorrow.  Treat underlying medical problems, metabolic and infectious, as this is most like the etiology of delirium. Continue other psychiatric medications for now. After patient is switched to  trach and not sedated, role and necessity of seroquel and amitriptyline can be evaluated.  Psychiatry consult team to continue to follow.    Disposition: No evidence of imminent risk to self or others at present.   Patient does not meet criteria for psychiatric inpatient admission.  Christoper Allegra, MD 02/07/2021 12:09 PM  Total Time Spent in Direct Patient Care:  I personally spent  50 minutes in the hospital in direct patient care. The direct patient care time included face-to-face time with the patient, reviewing the patient's chart, communicating with other professionals, and coordinating care. Greater than 50% of this time was spent in counseling or coordinating care with the patient regarding goals of hospitalization, psycho-education, and discharge planning needs.   Janine Limbo, MD Psychiatrist

## 2021-02-07 NOTE — Progress Notes (Signed)
Patient transported from MRI to 4Y81 without complications.

## 2021-02-07 NOTE — Procedures (Signed)
Diagnostic Bronchoscopy  Rhonda Winters  [id number]  03-01-1945  Date:02/07/21  Time:3:04 PM   Provider Performing:Donnetta Gillin Mauricio Po   Procedure: Diagnostic Bronchoscopy (06770)  Indication(s) Assist with direct visualization of tracheostomy placement  Consent Risks of the procedure as well as the alternatives and risks of each were explained to the patient and/or caregiver.  Consent for the procedure was obtained.   Anesthesia See separate tracheostomy note   Time Out Verified patient identification, verified procedure, site/side was marked, verified correct patient position, special equipment/implants available, medications/allergies/relevant history reviewed, required imaging and test results available.   Sterile Technique Usual hand hygiene, masks, gowns, and gloves were used   Procedure Description Bronchoscope advanced through endotracheal tube and into airway.  After suctioning out tracheal secretions, bronchoscope used to provide direct visualization of tracheostomy placement.   Complications/Tolerance None; patient tolerated the procedure well.   EBL Large blood clot suctioned out of ETT following procedure  EBL 20cc  Specimen(s) None  Lenice Llamas, MD [facility] [place] 02/07/2021 3:05 PM Pager: see AMION  If no response to pager, please call critical care on call (see AMION) until 7pm After 7:00 pm call Elink

## 2021-02-07 NOTE — Plan of Care (Signed)

## 2021-02-07 NOTE — Progress Notes (Signed)
Patient transported to MRI 

## 2021-02-07 NOTE — Progress Notes (Signed)
NAME:  Rhonda Winters, MRN:  [id number], DOB:  1945/08/21, LOS: 12 ADMISSION DATE:  01/25/2021, CONSULTATION DATE:  01/27/21 REFERRING MD:  Florene Glen, CHIEF COMPLAINT:  AMS   BRIEF  75 year old woman w/ hx of metabolic syndrome presenting with increasing confusion.  Workup has revealed RUL Pneumonia and pneumococcal bacteremia.  Unfortunately became more agitated through day, given ativan but then became obtunded with increased WOB.  Initial dose flumazenil ineffective.  Transferred to ICU and PCCM consulted.     Pertinent  Medical History  Hypothyroidism HTN Prior stroke T2DM  Significant Hospital Events: Including procedures, antibiotic start and stop dates in addition to other pertinent events   11/29 admitted 11/30 decompensation, intubation.  Attempted LP. 12/1 seizure-like activity, Ceribell in progress.  AED's initiated for seizure like movement.  IR LP. CT Head with no acute intracranial abnormalities, chronic small vessel disease, chronic R PCA territory infarct 12.2 - Afebrile . RN reports no seizure activity overnight.  Propofol turned off this am, remains on 50 mcg fentanyl. D5 turned off with hyperglycemia/ Pt waking, following simple commands intermittently, purposeful movement . Blood cultures with pan sensitive strep pneumo. Vent - 40-50% FiO2, PEEP 5 Glucose range 177-185 12/3 - given lasix yesterday -> then soft BP -> needed fluids. Had SVT ->on cardizem and resolved. On 50% fio2. On fent gtt/diprivan gtt (prior intolreance to precedex) -> agitated encephalopathy +. Afebrile 12/5 SBT done on prop/fent and failed 2/2 small TV, trial Precedex again.   Failed extubation->reintubated 12/6 No overnight events, calm on Precedex/Fentanyl 12/7 Diuresed well, overnight hypotension with transient pressors resolved this AM, no improvement in encephalopathy, vesicular lesions found on sacrum yesterday, viral cultures pending 12/8 Continue to remains significantly encephalopathic. Eyes  are spontaneously open and movement seen in all extremities but she will no track and unable to follow any commands on dilaudid and propofol drip   Interim History / Subjective:  Nursing overnight thinks she might have been following commands. Restless this AM and cannot get her to follow.  Objective   Blood pressure (!) 153/91, pulse (!) 127, temperature 99.2 F (37.3 C), temperature source Axillary, resp. rate (!) 32, height 5\' 2"  (1.575 m), weight 102.7 kg, SpO2 99 %.    Vent Mode: PRVC FiO2 (%):  [30 %] 30 % Set Rate:  [26 bmp] 26 bmp Vt Set:  [400 mL] 400 mL PEEP:  [5 cmH20] 5 cmH20 Plateau Pressure:  [14 cmH20-17 cmH20] 16 cmH20   Intake/Output Summary (Last 24 hours) at 02/07/2021 0102 Last data filed at 02/07/2021 0554 Gross per 24 hour  Intake 2214.4 ml  Output 1950 ml  Net 264.4 ml    Filed Weights   02/04/21 0500 02/06/21 0500 02/07/21 0500  Weight: 97.3 kg 98.8 kg 102.7 kg   Physical Exam   No distress Writhing all 4 ext nonpurposefully Less tone today in ext which is an improvement Pupils reactive but not tracking Lungs clear Ext warm, mild anasarca  H/H down a fair bit unless the 9.0 yesterday was erroneous   Resolved Hospital Problem list   AKI  -resolved Seizure Like Activity   -Noted on 11/30, Ceribell negative 11/30.  Repeated on 12/1 negative as well.  Assessment & Plan:   Acute Hypoxemic Respiratory Failure secondary to Pneumococcal PNA  Pneumococcal PNA with Bacteremia (POA) Acute Metabolic Encephalopathy- persistent, LP/MRI brain not explaining.  Ceribell neg x 2, initial EEG not explaining.  Her mental status seems to wax and wane. 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  HSV PCR from lesions pending Urinary incontinence- External urinary cath  DM II with Hyperglycemia  At Risk Malnutrition  Hypo-K: repleted  - Continue vent support, VAP prevention  bundle, mental status and secretion burden precludes extubation  - Will have psychiatry evaluate for atypical catatonia, may be worth doing benzodiazepine trial - VEEG per neurology, appreciate help with complex workup - Continue acyclovir/rocephin, duration TBD - Tracheostomy today to facilitate vent and sedation weaning  Best Practice (right click and "Reselect all SmartList Selections" daily)  Diet/type: TF DVT prophylaxis: LMWH  GI prophylaxis: PPI Lines: N/A Foley:  N/A Code Status:  full code Last date of multidisciplinary goals of care discussion: Continue to update family regarding plan of care.   Critical care time: 33 minutes   Performed by: Candee Furbish

## 2021-02-07 NOTE — Procedures (Addendum)
Patient Name: Rhonda Winters  MRN: [id number]  Epilepsy Attending: Lora Havens  Referring Physician/Provider: Dr Donnetta Simpers Duration: 02/06/2021 1433 to 02/07/2021 1433   Patient history: 75 year old female with altered mental status.  EEG evaluate for seizure.     Level of alertness:  awake, asleep   AEDs during EEG study: GBP   Technical aspects: This EEG study was done with scalp electrodes positioned according to the 10-20 International system of electrode placement. Electrical activity was acquired at a sampling rate of 500Hz  and reviewed with a high frequency filter of 70Hz  and a low frequency filter of 1Hz . EEG data were recorded continuously and digitally stored.    Description: No clear posterior dominant rhythm was seen.  Sleep was characterized by sleep spindles (12 to 14 Hz), maximum frontocentral region. EEG showed continuous generalized polymorphic predominantly 5  to 7 Hz theta slowing admixed with 2-3hz  intermittent generalized delta slowing.  Hyperventilation and photic stimulation were not performed.      ABNORMALITY - Continuous slow, generalized   IMPRESSION: This study is suggestive of moderate to severe diffuse encephalopathy, nonspecific etiology. No seizures or definite epileptiform discharges were seen throughout the recording.   Kam Rahimi Barbra Sarks

## 2021-02-07 NOTE — Progress Notes (Signed)
LTM maintain done. Fz and EKG leads reapplied. No skin breakdown noted. Results pending.

## 2021-02-07 NOTE — Procedures (Signed)
Percutaneous Tracheostomy Procedure Note   Rhonda Winters  [id number]  April 05, 1945  Date:02/07/21  Time:3:11 PM   Provider Performing:Donika Butner C Tamala Julian  Procedure: Percutaneous Tracheostomy with Bronchoscopic Guidance (31600)  Indication(s) Persistent respiratory failure  Consent Risks of the procedure as well as the alternatives and risks of each were explained to the patient and/or caregiver.  Consent for the procedure was obtained.  Anesthesia Etomidate, Versed, Fentanyl, Rocuronium   Time Out Verified patient identification, verified procedure, site/side was marked, verified correct patient position, special equipment/implants available, medications/allergies/relevant history reviewed, required imaging and test results available.   Sterile Technique Maximal sterile technique including sterile barrier drape, hand hygiene, sterile gown, sterile gloves, mask, hair covering.    Procedure Description Appropriate anatomy identified by palpation.  Patient's neck prepped and draped in sterile fashion.  1% lidocaine with epinephrine was used to anesthetize skin overlying neck.  1.5cm incision made and blunt dissection performed until tracheal rings could be easily palpated.   Then a size XLT prox 6-0 Shiley tracheostomy was placed under bronchoscopic visualization using usual Seldinger technique and serial dilation.   Bronchoscope confirmed placement above the carina.  Tracheostomy was sutured in place with adhesive pad to protect skin under pressure.    Patient connected to ventilator.   Complications/Tolerance Blood clot introduced into trachea, suctioned out Chest X-ray is ordered to confirm no post-procedural complication.   EBL none   Specimen(s) None

## 2021-02-08 ENCOUNTER — Inpatient Hospital Stay (HOSPITAL_COMMUNITY): Payer: Medicare Other

## 2021-02-08 DIAGNOSIS — R4182 Altered mental status, unspecified: Secondary | ICD-10-CM | POA: Diagnosis not present

## 2021-02-08 DIAGNOSIS — A419 Sepsis, unspecified organism: Secondary | ICD-10-CM | POA: Diagnosis not present

## 2021-02-08 DIAGNOSIS — N179 Acute kidney failure, unspecified: Secondary | ICD-10-CM | POA: Diagnosis not present

## 2021-02-08 DIAGNOSIS — J9601 Acute respiratory failure with hypoxia: Secondary | ICD-10-CM | POA: Diagnosis not present

## 2021-02-08 DIAGNOSIS — G9340 Encephalopathy, unspecified: Secondary | ICD-10-CM | POA: Diagnosis not present

## 2021-02-08 LAB — PHOSPHORUS: Phosphorus: 2.5 mg/dL (ref 2.5–4.6)

## 2021-02-08 LAB — GLUCOSE, CAPILLARY
Glucose-Capillary: 136 mg/dL — ABNORMAL HIGH (ref 70–99)
Glucose-Capillary: 162 mg/dL — ABNORMAL HIGH (ref 70–99)
Glucose-Capillary: 171 mg/dL — ABNORMAL HIGH (ref 70–99)
Glucose-Capillary: 197 mg/dL — ABNORMAL HIGH (ref 70–99)
Glucose-Capillary: 217 mg/dL — ABNORMAL HIGH (ref 70–99)
Glucose-Capillary: 247 mg/dL — ABNORMAL HIGH (ref 70–99)

## 2021-02-08 LAB — CBC
HCT: 23.6 % — ABNORMAL LOW (ref 36.0–46.0)
Hemoglobin: 7 g/dL — ABNORMAL LOW (ref 12.0–15.0)
MCH: 32.4 pg (ref 26.0–34.0)
MCHC: 29.7 g/dL — ABNORMAL LOW (ref 30.0–36.0)
MCV: 109.3 fL — ABNORMAL HIGH (ref 80.0–100.0)
Platelets: 337 10*3/uL (ref 150–400)
RBC: 2.16 MIL/uL — ABNORMAL LOW (ref 3.87–5.11)
RDW: 14.9 % (ref 11.5–15.5)
WBC: 9.4 10*3/uL (ref 4.0–10.5)
nRBC: 0 % (ref 0.0–0.2)

## 2021-02-08 LAB — BASIC METABOLIC PANEL
Anion gap: 5 (ref 5–15)
BUN: 44 mg/dL — ABNORMAL HIGH (ref 8–23)
CO2: 26 mmol/L (ref 22–32)
Calcium: 9.4 mg/dL (ref 8.9–10.3)
Chloride: 115 mmol/L — ABNORMAL HIGH (ref 98–111)
Creatinine, Ser: 0.6 mg/dL (ref 0.44–1.00)
GFR, Estimated: >60 mL/min (ref >60)
Glucose, Bld: 177 mg/dL — ABNORMAL HIGH (ref 70–99)
Potassium: 4.1 mmol/L (ref 3.5–5.1)
Sodium: 146 mmol/L — ABNORMAL HIGH (ref 135–145)

## 2021-02-08 LAB — MAGNESIUM: Magnesium: 2.1 mg/dL (ref 1.7–2.4)

## 2021-02-08 LAB — TYPE AND SCREEN
ABO/RH(D): A POS
Antibody Screen: NEGATIVE

## 2021-02-08 LAB — METHYLMALONIC ACID, SERUM: Methylmalonic Acid, Quantitative: 580 nmol/L — ABNORMAL HIGH (ref 0–378)

## 2021-02-08 LAB — VARICELLA-ZOSTER BY PCR: Varicella-Zoster, PCR: NEGATIVE

## 2021-02-08 MED ORDER — ACETAMINOPHEN 650 MG RE SUPP: 650.0000 mg | Freq: Four times a day (QID) | RECTAL | Status: DC | PRN

## 2021-02-08 MED ORDER — LACTATED RINGERS IV SOLN: INTRAVENOUS | Status: DC

## 2021-02-08 MED ORDER — GERHARDT'S BUTT CREAM
TOPICAL_CREAM | Freq: Two times a day (BID) | CUTANEOUS | Status: DC
  Administered 2021-02-08 – 2021-02-12 (×5): 1 via TOPICAL
  Filled 2021-02-08 (×2): qty 1

## 2021-02-08 MED ORDER — ACETAMINOPHEN 325 MG PO TABS
650.0000 mg | ORAL_TABLET | Freq: Four times a day (QID) | ORAL | Status: DC | PRN
  Administered 2021-02-08 – 2021-02-12 (×3): 650 mg
  Filled 2021-02-08 (×3): qty 2

## 2021-02-08 MED ORDER — DEXMEDETOMIDINE HCL IN NACL 400 MCG/100ML IV SOLN
0.4000 ug/kg/h | INTRAVENOUS | Status: DC
  Administered 2021-02-08: 1.2 ug/kg/h via INTRAVENOUS
  Administered 2021-02-08: 1 ug/kg/h via INTRAVENOUS
  Administered 2021-02-08: 0.4 ug/kg/h via INTRAVENOUS
  Administered 2021-02-09 (×2): 1.2 ug/kg/h via INTRAVENOUS
  Administered 2021-02-09 (×2): 0.8 ug/kg/h via INTRAVENOUS
  Administered 2021-02-09: 0.6 ug/kg/h via INTRAVENOUS
  Administered 2021-02-10: 01:00:00 0.8 ug/kg/h via INTRAVENOUS
  Administered 2021-02-10: 06:00:00 0.7 ug/kg/h via INTRAVENOUS
  Filled 2021-02-08 (×10): qty 100

## 2021-02-08 MED ORDER — CLONIDINE HCL 0.2 MG PO TABS
0.2000 mg | ORAL_TABLET | Freq: Three times a day (TID) | ORAL | Status: DC
  Administered 2021-02-08 (×2): 0.2 mg
  Filled 2021-02-08 (×2): qty 1

## 2021-02-08 MED ORDER — FOLIC ACID 1 MG PO TABS: 1.0000 mg | ORAL_TABLET | Freq: Every day | ORAL | Status: DC

## 2021-02-08 NOTE — Progress Notes (Signed)
PT Cancellation Note  Patient Details Name: LALA BEEN MRN: [id number] DOB: [birth year]   Cancelled Treatment:    Reason Eval/Treat Not Completed: Patient not medically ready. Pt remains intubated and is not following commands. Sedation off completely. Please re-consult PT if appropriate to progress mobility.  Kittie Plater, PT, DPT Acute Rehabilitation Services Pager #: [phone] Office #: [phone]    Berline Lopes 02/08/2021, 10:18 AM

## 2021-02-08 NOTE — Progress Notes (Signed)
Neurology Progress Note   S:// No acute events overnight.  Nearly 48 hours of LTM EEG without any evidence of seizure activity.  O:// Current vital signs: BP (!) 160/62   Pulse 84   Temp 99.3 F (37.4 C) (Axillary)   Resp (!) 26   Ht 5' 2"  (1.575 m)   Wt 102.7 kg   SpO2 97%   BMI 41.41 kg/m  Vital signs in last 24 hours: Temp:  [98.2 F (36.8 C)-99.9 F (37.7 C)] 99.3 F (37.4 C) (12/12 0800) Pulse Rate:  [62-112] 84 (12/12 0900) Resp:  [26-34] 26 (12/12 0818) BP: (134-205)/(56-97) 160/62 (12/12 0900) SpO2:  [94 %-100 %] 97 % (12/12 0900) FiO2 (%):  [30 %-60 %] 40 % (12/12 0818) Weight:  [102.7 kg] 102.7 kg (12/12 0500) General: Intubated, sedated, on propofol HEENT: Normocephalic/atraumatic Abdomen: Obese, nontender Extremities warm well perfused with trace edema Cardiovascular: Regular rhythm Respiratory: Vented Neurological exam Intubated, sedated, on propofol To loud voice, opens eyes. Does not follow commands Does not track Does not blink to threat from both sides Pupils equal round react light, extraocular movements difficult to check given inability to follow commands but has positive oculocephalics, corneals present bilaterally, facial symmetry difficult to ascertain due to the 2. Motor examination: No spontaneous movement and no movement to noxious stimulation. Sensory: No spontaneous movement or movement to noxious stimulation but she does grimace to noxious stimulation  Medications  Current Facility-Administered Medications:    0.9 %  sodium chloride infusion, , Intravenous, Continuous, McQuaid, Douglas B, MD, Stopped at 02/02/21 2325   0.9 %  sodium chloride infusion, 250 mL, Intravenous, Continuous, Oletta Darter Virgina Evener, MD, Stopped at 02/05/21 1335   acetaminophen (TYLENOL) tablet 650 mg, 650 mg, Oral, Q6H PRN **OR** acetaminophen (TYLENOL) suppository 650 mg, 650 mg, Rectal, Q6H PRN, Rise Patience, MD   acyclovir (ZOVIRAX) 200 MG/5ML suspension  SUSP 800 mg, 800 mg, Per Tube, 5 X Daily, McQuaid, Douglas B, MD, 800 mg at 02/08/21 0847   albuterol (PROVENTIL) (2.5 MG/3ML) 0.083% nebulizer solution 2.5 mg, 2.5 mg, Nebulization, Q4H PRN, Simonne Maffucci B, MD   amitriptyline (ELAVIL) tablet 25 mg, 25 mg, Per Tube, QHS, Gleason, Otilio Carpen, PA-C, 25 mg at 02/07/21 2246   cefTRIAXone (ROCEPHIN) 2 g in sodium chloride 0.9 % 100 mL IVPB, 2 g, Intravenous, Q24H, Dimple Nanas, RPH, Stopped at 02/07/21 2320   chlorhexidine gluconate (MEDLINE KIT) (PERIDEX) 0.12 % solution 15 mL, 15 mL, Mouth Rinse, BID, Candee Furbish, MD, 15 mL at 02/08/21 4081   Chlorhexidine Gluconate Cloth 2 % PADS 6 each, 6 each, Topical, Daily, Candee Furbish, MD, 6 each at 02/07/21 1748   dexmedetomidine (PRECEDEX) 400 MCG/100ML (4 mcg/mL) infusion, 0.4-1.2 mcg/kg/hr, Intravenous, Titrated, Chand, Sudham, MD   docusate (COLACE) 50 MG/5ML liquid 100 mg, 100 mg, Per Tube, BID, Candee Furbish, MD, 100 mg at 02/08/21 0847   enoxaparin (LOVENOX) injection 40 mg, 40 mg, Subcutaneous, Q24H, Candee Furbish, MD, 40 mg at 02/08/21 0848   feeding supplement (PROSource TF) liquid 45 mL, 45 mL, Per Tube, BID, Candee Furbish, MD, 45 mL at 02/08/21 0847   feeding supplement (VITAL HIGH PROTEIN) liquid 1,000 mL, 1,000 mL, Per Tube, Continuous, Candee Furbish, MD, Last Rate: 50 mL/hr at 02/07/21 1643, 1,000 mL at 44/81/85 6314   folic acid (FOLVITE) tablet 1 mg, 1 mg, Per Tube, Daily, Green, Terri L, RPH, 1 mg at 02/08/21 0847   gabapentin (NEURONTIN) 250 MG/5ML solution 100  mg, 100 mg, Per Tube, TID, Gleason, Otilio Carpen, PA-C, 100 mg at 02/08/21 0847   insulin aspart (novoLOG) injection 0-9 Units, 0-9 Units, Subcutaneous, Q4H, Rise Patience, MD, 2 Units at 02/08/21 0325   labetalol (NORMODYNE) injection 10 mg, 10 mg, Intravenous, Q2H PRN, Candee Furbish, MD, 10 mg at 02/07/21 1647   lactated ringers infusion, , Intravenous, Continuous, Jacky Kindle, MD   levothyroxine  (SYNTHROID) tablet 125 mcg, 125 mcg, Per Tube, Q0600, Minda Ditto, RPH, 125 mcg at 02/08/21 0544   LORazepam (ATIVAN) injection 2 mg, 2 mg, Intravenous, Once, Massengill, Ovid Curd, MD   MEDLINE mouth rinse, 15 mL, Mouth Rinse, 10 times per day, Candee Furbish, MD, 15 mL at 02/08/21 0542   melatonin tablet 3 mg, 3 mg, Per Tube, QHS, Green, Terri L, RPH, 3 mg at 02/07/21 2340   midazolam (VERSED) injection 2 mg, 2 mg, Intravenous, Q6H PRN, Gleason, Otilio Carpen, PA-C, 2 mg at 02/07/21 1453   midazolam (VERSED) injection 2 mg, 2 mg, Intravenous, Once, Gleason, Otilio Carpen, PA-C   multivitamin liquid 15 mL, 15 mL, Per Tube, Daily, Green, Terri L, RPH, 15 mL at 02/08/21 0847   pantoprazole (PROTONIX) injection 40 mg, 40 mg, Intravenous, QHS, Candee Furbish, MD, 40 mg at 02/07/21 2252   polyethylene glycol (MIRALAX / GLYCOLAX) packet 17 g, 17 g, Per Tube, Daily, Candee Furbish, MD, 17 g at 02/08/21 0847   QUEtiapine (SEROQUEL) tablet 100 mg, 100 mg, Per Tube, QHS, Gleason, Otilio Carpen, PA-C, 100 mg at 02/07/21 2246   sodium chloride flush (NS) 0.9 % injection 10-40 mL, 10-40 mL, Intracatheter, Q12H, McQuaid, Douglas B, MD, 10 mL at 02/07/21 1036   sodium chloride flush (NS) 0.9 % injection 10-40 mL, 10-40 mL, Intracatheter, PRN, Juanito Doom, MD   [COMPLETED] thiamine 569m in normal saline (557m IVPB, 500 mg, Intravenous, Q8H, Stopped at 02/05/21 0550 **FOLLOWED BY** thiamine (B-1) 250 mg in sodium chloride 0.9 % 50 mL IVPB, 250 mg, Intravenous, Daily, Stopped at 02/07/21 1145 **FOLLOWED BY** [START ON 02/12/2021] thiamine (B-1) injection 100 mg, 100 mg, Intravenous, Daily, KhDonnetta SimpersMD Labs CBC    Component Value Date/Time   WBC 9.4 02/08/2021 0318   RBC 2.16 (L) 02/08/2021 0318   HGB 7.0 (L) 02/08/2021 0318   HCT 23.6 (L) 02/08/2021 0318   PLT 337 02/08/2021 0318   MCV 109.3 (H) 02/08/2021 0318   MCH 32.4 02/08/2021 0318   MCHC 29.7 (L) 02/08/2021 0318   RDW 14.9 02/08/2021 0318    LYMPHSABS 0.6 (L) 01/26/2021 0302   MONOABS 0.2 01/26/2021 0302   EOSABS 0.0 01/26/2021 0302   BASOSABS 0.1 01/26/2021 0302    CMP     Component Value Date/Time   NA 146 (H) 02/08/2021 0318   K 4.1 02/08/2021 0318   CL 115 (H) 02/08/2021 0318   CO2 26 02/08/2021 0318   GLUCOSE 177 (H) 02/08/2021 0318   BUN 44 (H) 02/08/2021 0318   CREATININE 0.60 02/08/2021 0318   CALCIUM 9.4 02/08/2021 0318   PROT 5.6 (L) 02/03/2021 1446   ALBUMIN 2.2 (L) 02/03/2021 1446   AST 23 02/03/2021 1446   ALT 14 02/03/2021 1446   ALKPHOS 48 02/03/2021 1446   BILITOT 2.2 (H) 02/03/2021 1446   GFRNONAA >60 02/08/2021 0318   Imaging I have reviewed images in epic and the results pertinent to this consultation are: MR Brain x2 in the past 8 days- no acute chages. Chornic Right PCA infarct.  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 HLD, hypothyrodisim, coming in for eval of AMS. Found to have Pneumococcal pneumonia for which she is being treated. Became agitated and hypoxic requiring intubation. Mentation seems to be far from baseline. Work up to include EEG, MR and LP all unremarkable. Question of viral lesions on the back c/f HSV - but CSF had zero WBCs making HSV unlikely. A psychiatric etiology should be kept in the differential but she does not have an extensive history pointing.  Appreciate psychiatry consultation. MRI C-spine obtained for hyperreflexia-unremarkable 2 days of EEG unremarkable for seizures-can discontinue Etiology of her mental status not returning to baseline remains elusive.   Recommendations:  Discontinue continuous EEG Minimize sedation as much as possible Propofol has been discontinued and she will be on Precedex as needed. Acyclovir per ID Appreciate psychiatry consultation. I will follow her exam with you Plan d/w Dr. Tacy Learn and Gerald Leitz APP in the unit  -- Amie Portland,  MD Neurologist [facility] Pager: [phone]

## 2021-02-08 NOTE — Procedures (Addendum)
Patient Name: Rhonda Winters  MRN: [id number]  Epilepsy Attending: Lora Havens  Referring Physician/Provider: Dr Donnetta Simpers Duration: 02/07/2021 1433 to 02/08/2021 1121   Patient history: 75 year old female with altered mental status.  EEG evaluate for seizure.     Level of alertness:  awake, asleep   AEDs during EEG study: GBP, propofol   Technical aspects: This EEG study was done with scalp electrodes positioned according to the 10-20 International system of electrode placement. Electrical activity was acquired at a sampling rate of 500Hz  and reviewed with a high frequency filter of 70Hz  and a low frequency filter of 1Hz . EEG data were recorded continuously and digitally stored.    Description: No clear posterior dominant rhythm was seen.  Sleep was characterized by sleep spindles (12 to 14 Hz), maximum frontocentral region. EEG showed continuous generalized polymorphic predominantly 5  to 7 Hz theta slowing admixed with 2-3hz  intermittent generalized delta slowing, at times with triphasic morphology.  Hyperventilation and photic stimulation were not performed.      ABNORMALITY - Continuous slow, generalized   IMPRESSION: This study is suggestive of moderate to severe diffuse encephalopathy, nonspecific etiology. No seizures or definite epileptiform discharges were seen throughout the recording.   Timohty Renbarger Barbra Sarks

## 2021-02-08 NOTE — Progress Notes (Signed)
Alerted Dr. Tacy Learn of patient's SBP >180 despite PRN labetalol. He ordered clonidine 0.2mg  per tube TID.

## 2021-02-08 NOTE — Progress Notes (Signed)
LTM EEG discontinued - no skin breakdown at unhook.   

## 2021-02-08 NOTE — Plan of Care (Signed)

## 2021-02-08 NOTE — Progress Notes (Addendum)
NAME:  Rhonda Winters, MRN:  [id number], DOB:  19-Oct-1945, LOS: 41 ADMISSION DATE:  01/25/2021, CONSULTATION DATE:  01/27/21 REFERRING MD:  Florene Glen, CHIEF COMPLAINT:  AMS   BRIEF  75 year old woman w/ hx of metabolic syndrome presenting with increasing confusion.  Workup has revealed RUL Pneumonia and pneumococcal bacteremia.  Unfortunately became more agitated through day, given ativan but then became obtunded with increased WOB.  Initial dose flumazenil ineffective.  Transferred to ICU and PCCM consulted.     Pertinent  Medical History  Hypothyroidism HTN Prior stroke T2DM  Significant Hospital Events: Including procedures, antibiotic start and stop dates in addition to other pertinent events   11/29 admitted 11/30 decompensation, intubation.  Attempted LP. 12/1 seizure-like activity, Ceribell in progress.  AED's initiated for seizure like movement.  IR LP. CT Head with no acute intracranial abnormalities, chronic small vessel disease, chronic R PCA territory infarct 12.2 - Afebrile . RN reports no seizure activity overnight.  Propofol turned off this am, remains on 50 mcg fentanyl. D5 turned off with hyperglycemia/ Pt waking, following simple commands intermittently, purposeful movement . Blood cultures with pan sensitive strep pneumo. Vent - 40-50% FiO2, PEEP 5 Glucose range 177-185 12/3 - given lasix yesterday -> then soft BP -> needed fluids. Had SVT ->on cardizem and resolved. On 50% fio2. On fent gtt/diprivan gtt (prior intolreance to precedex) -> agitated encephalopathy +. Afebrile 12/5 SBT done on prop/fent and failed 2/2 small TV, trial Precedex again.   Failed extubation->reintubated 12/6 No overnight events, calm on Precedex/Fentanyl 12/7 Diuresed well, overnight hypotension with transient pressors resolved this AM, no improvement in encephalopathy, vesicular lesions found on sacrum yesterday, viral cultures pending 12/8 Continue to remains significantly encephalopathic. Eyes  are spontaneously open and movement seen in all extremities but she will no track and unable to follow any commands on dilaudid and propofol drip   Interim History / Subjective:  Patient is s/p trach, tolerated procedure well Remained afebrile  Objective   Blood pressure (!) 147/58, pulse 82, temperature 99.3 F (37.4 C), temperature source Axillary, resp. rate (!) 26, height 5\' 2"  (1.575 m), weight 102.7 kg, SpO2 96 %.    Vent Mode: PSV;CPAP FiO2 (%):  [30 %-60 %] 40 % Set Rate:  [26 bmp] 26 bmp Vt Set:  [400 mL] 400 mL PEEP:  [5 cmH20] 5 cmH20 Pressure Support:  [8 cmH20] 8 cmH20 Plateau Pressure:  [11 cmH20-20 cmH20] 15 cmH20   Intake/Output Summary (Last 24 hours) at 02/08/2021 0865 Last data filed at 02/08/2021 0700 Gross per 24 hour  Intake 2282.95 ml  Output 2450 ml  Net -167.05 ml   Filed Weights   02/06/21 0500 02/07/21 0500 02/08/21 0500  Weight: 98.8 kg 102.7 kg 102.7 kg     Physical exam: General: Crtitically ill-appearing elderly Caucasian female, s/p trach HEENT: [place]/AT, eyes anicteric.  Dry mucous membranes Neuro: Opens eyes with vocal stimuli, not following commands, withdrawing in both lower extremities Chest: Coarse breath sounds, no wheezes or rhonchi Heart: Regular rate and rhythm, no murmurs or gallops Abdomen: Soft, nontender, nondistended, bowel sounds present Skin: No rash  Resolved Hospital Problem list   AKI  -resolved Seizure Like Activity   -Noted on 11/30, Ceribell negative 11/30.  Repeated on 12/1 negative as well.  Assessment & Plan:   Acute Hypoxemic Respiratory Failure/ Sepsis secondary to Pneumococcal PNA  Pneumococcal PNA with Bacteremia (POA) Acute Metabolic/infectious encephalopathy LP/MRI brain not explaining her acute illness Ceribell neg x 2, initial  EEG not explaining.  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   Critical care time:     Total critical care time: 39 minutes  Performed by: [facility] care time was exclusive of separately billable procedures and treating other patients.   Critical care was necessary to treat or prevent imminent or life-threatening deterioration.   Critical care was time spent personally by me on the following activities: development of treatment plan with patient and/or surrogate as well as nursing, discussions with consultants, evaluation of patient's response to treatment, examination of patient, obtaining history from patient or surrogate, ordering and performing treatments and interventions, ordering and review of laboratory studies, ordering and review of radiographic studies, pulse oximetry and re-evaluation of patient's condition.   Jacky Kindle MD [facility] See Amion for pager If no response to pager, please call [phone] until 7pm After 7pm, Please call E-link [phone]

## 2021-02-08 NOTE — Consult Note (Addendum)
Brief Psychiatry Consult Note  The patient was last seen by the psychiatry service on 02/07/21. Interim documentation by primary team and nursing staff has been reviewed. At this time, patient's cognition is reportedly improving s/p tracheostomy; will defer planned reassessment and ativan re-trial until tomorrow; may cancel this plan if pt continues to improve. D/w primary team. Please see last consult note for full assessment.   - psych to reeval tomorrow.    Ted Leonhart A Makailyn Mccormick

## 2021-02-08 NOTE — Progress Notes (Signed)
SLP Cancellation Note  Patient Details Name: Rhonda Winters MRN: [id number] DOB: Jul 25, 1945   Cancelled treatment:       Reason Eval/Treat Not Completed: Patient not medically ready Patient with new tracheostomy. Orders for SLP eval and treat for PMSV and swallowing received. Will follow pt closely for readiness for SLP interventions as appropriate.     Delroy Ordway, Katherene Ponto 02/08/2021, 7:50 AM

## 2021-02-08 NOTE — Progress Notes (Signed)
59mL dilaudid wasted in stericycle with Candace Cruise RN.

## 2021-02-08 NOTE — Progress Notes (Signed)
Patient's bottom is very red and raw, extending down inner thighs. VO from Dr. Tacy Learn for Rhonda Winters's butt cream.

## 2021-02-09 ENCOUNTER — Inpatient Hospital Stay (HOSPITAL_COMMUNITY): Payer: Medicare Other

## 2021-02-09 DIAGNOSIS — R401 Stupor: Secondary | ICD-10-CM

## 2021-02-09 LAB — BASIC METABOLIC PANEL
Anion gap: 7 (ref 5–15)
BUN: 36 mg/dL — ABNORMAL HIGH (ref 8–23)
CO2: 24 mmol/L (ref 22–32)
Calcium: 9.7 mg/dL (ref 8.9–10.3)
Chloride: 115 mmol/L — ABNORMAL HIGH (ref 98–111)
Creatinine, Ser: 0.57 mg/dL (ref 0.44–1.00)
GFR, Estimated: >60 mL/min (ref >60)
Glucose, Bld: 322 mg/dL — ABNORMAL HIGH (ref 70–99)
Potassium: 3.5 mmol/L (ref 3.5–5.1)
Sodium: 146 mmol/L — ABNORMAL HIGH (ref 135–145)

## 2021-02-09 LAB — MAGNESIUM: Magnesium: 1.7 mg/dL (ref 1.7–2.4)

## 2021-02-09 LAB — GLUCOSE, CAPILLARY
Glucose-Capillary: 107 mg/dL — ABNORMAL HIGH (ref 70–99)
Glucose-Capillary: 122 mg/dL — ABNORMAL HIGH (ref 70–99)
Glucose-Capillary: 141 mg/dL — ABNORMAL HIGH (ref 70–99)
Glucose-Capillary: 142 mg/dL — ABNORMAL HIGH (ref 70–99)
Glucose-Capillary: 168 mg/dL — ABNORMAL HIGH (ref 70–99)
Glucose-Capillary: 196 mg/dL — ABNORMAL HIGH (ref 70–99)

## 2021-02-09 LAB — CBC
HCT: 27 % — ABNORMAL LOW (ref 36.0–46.0)
Hemoglobin: 8.3 g/dL — ABNORMAL LOW (ref 12.0–15.0)
MCH: 33.2 pg (ref 26.0–34.0)
MCHC: 30.7 g/dL (ref 30.0–36.0)
MCV: 108 fL — ABNORMAL HIGH (ref 80.0–100.0)
Platelets: 422 10*3/uL — ABNORMAL HIGH (ref 150–400)
RBC: 2.5 MIL/uL — ABNORMAL LOW (ref 3.87–5.11)
RDW: 14.7 % (ref 11.5–15.5)
WBC: 16.1 10*3/uL — ABNORMAL HIGH (ref 4.0–10.5)
nRBC: 0 % (ref 0.0–0.2)

## 2021-02-09 LAB — PHOSPHORUS: Phosphorus: 2.9 mg/dL (ref 2.5–4.6)

## 2021-02-09 MED ORDER — QUETIAPINE FUMARATE 25 MG PO TABS
25.0000 mg | ORAL_TABLET | Freq: Every day | ORAL | Status: DC
Start: 2021-02-09 — End: 2021-02-11
  Administered 2021-02-10: 22:00:00 25 mg
  Filled 2021-02-09: qty 1

## 2021-02-09 MED ORDER — POTASSIUM CHLORIDE 10 MEQ/100ML IV SOLN
10.0000 meq | INTRAVENOUS | Status: AC
  Administered 2021-02-09 (×4): 10 meq via INTRAVENOUS
  Filled 2021-02-09 (×4): qty 100

## 2021-02-09 MED ORDER — MELATONIN 3 MG PO TABS
3.0000 mg | ORAL_TABLET | Freq: Every day | ORAL | Status: DC
  Filled 2021-02-09: qty 1

## 2021-02-09 MED ORDER — HYDRALAZINE HCL 20 MG/ML IJ SOLN
10.0000 mg | INTRAMUSCULAR | Status: AC | PRN
Start: 2021-02-09 — End: 2021-02-11
  Administered 2021-02-09 – 2021-02-10 (×2): 20 mg via INTRAVENOUS
  Filled 2021-02-09 (×2): qty 1

## 2021-02-09 MED ORDER — VITAL AF 1.2 CAL PO LIQD
1000.0000 mL | ORAL | Status: DC
  Administered 2021-02-10: 13:00:00 1000 mL

## 2021-02-09 MED ORDER — CLONIDINE HCL 0.3 MG/24HR TD PTWK
0.3000 mg | MEDICATED_PATCH | TRANSDERMAL | Status: DC
  Administered 2021-02-09: 0.3 mg via TRANSDERMAL
  Filled 2021-02-09: qty 1

## 2021-02-09 MED ORDER — HYDROCHLOROTHIAZIDE 25 MG PO TABS
25.0000 mg | ORAL_TABLET | Freq: Every day | ORAL | Status: DC
  Administered 2021-02-09: 25 mg
  Filled 2021-02-09: qty 1

## 2021-02-09 MED ORDER — MELATONIN 3 MG PO TABS: 3.0000 mg | ORAL_TABLET | Freq: Every day | ORAL | Status: DC

## 2021-02-09 MED ORDER — PROSOURCE TF PO LIQD
45.0000 mL | Freq: Three times a day (TID) | ORAL | Status: DC
  Administered 2021-02-10 – 2021-02-11 (×3): 45 mL
  Filled 2021-02-09 (×3): qty 45

## 2021-02-09 MED ORDER — CLONIDINE HCL 0.1 MG PO TABS
0.3000 mg | ORAL_TABLET | Freq: Three times a day (TID) | ORAL | Status: DC
Start: 2021-02-09 — End: 2021-02-09
  Administered 2021-02-09: 0.3 mg
  Filled 2021-02-09: qty 1

## 2021-02-09 MED ORDER — SODIUM CHLORIDE 0.9 % IV SOLN
3.0000 g | Freq: Four times a day (QID) | INTRAVENOUS | Status: DC
  Administered 2021-02-09 – 2021-02-12 (×14): 3 g via INTRAVENOUS
  Filled 2021-02-09 (×14): qty 8

## 2021-02-09 MED ORDER — LABETALOL HCL 5 MG/ML IV SOLN
10.0000 mg | INTRAVENOUS | Status: DC | PRN
  Administered 2021-02-09: 20 mg via INTRAVENOUS
  Administered 2021-02-09: 10 mg via INTRAVENOUS
  Administered 2021-02-10 – 2021-02-12 (×6): 20 mg via INTRAVENOUS
  Filled 2021-02-09 (×9): qty 4

## 2021-02-09 NOTE — Progress Notes (Signed)
NAME:  Rhonda Winters, MRN:  [id number], DOB:  Aug 17, 1945, LOS: 66 ADMISSION DATE:  01/25/2021, CONSULTATION DATE:  01/27/21 REFERRING MD:  Florene Glen, CHIEF COMPLAINT:  AMS   BRIEF  75 year old woman w/ hx of metabolic syndrome presenting with increasing confusion.  Workup has revealed RUL Pneumonia and pneumococcal bacteremia.  Unfortunately became more agitated through day, given ativan but then became obtunded with increased WOB.  Initial dose flumazenil ineffective.  Transferred to ICU and PCCM consulted.     Pertinent  Medical History  Hypothyroidism HTN Prior stroke T2DM  Significant Hospital Events: Including procedures, antibiotic start and stop dates in addition to other pertinent events   11/29 admitted 11/30 decompensation, intubation.  Attempted LP. 12/1 seizure-like activity, Ceribell in progress.  AED's initiated for seizure like movement.  IR LP. CT Head with no acute intracranial abnormalities, chronic small vessel disease, chronic R PCA territory infarct 12.2 - Afebrile . RN reports no seizure activity overnight.  Propofol turned off this am, remains on 50 mcg fentanyl. D5 turned off with hyperglycemia/ Pt waking, following simple commands intermittently, purposeful movement . Blood cultures with pan sensitive strep pneumo. Vent - 40-50% FiO2, PEEP 5 Glucose range 177-185 12/3 - given lasix yesterday -> then soft BP -> needed fluids. Had SVT ->on cardizem and resolved. On 50% fio2. On fent gtt/diprivan gtt (prior intolreance to precedex) -> agitated encephalopathy +. Afebrile 12/5 SBT done on prop/fent and failed 2/2 small TV, trial Precedex again.   Failed extubation->reintubated 12/6 No overnight events, calm on Precedex/Fentanyl 12/7 Diuresed well, overnight hypotension with transient pressors resolved this AM, no improvement in encephalopathy, vesicular lesions found on sacrum yesterday, viral cultures pending 12/8 Continue to remains significantly encephalopathic. Eyes  are spontaneously open and movement seen in all extremities but she will no track and unable to follow any commands on dilaudid and propofol drip   Interim History / Subjective:  Patient started spiking fever with T-max 101.5, white count trended up from 9-14 X-ray chest showed increasing right upper lobe infiltrates Objective   Blood pressure (!) 152/71, pulse 76, temperature (!) 101.5 F (38.6 C), temperature source Axillary, resp. rate (!) 39, height 5\' 2"  (1.575 m), weight 102.3 kg, SpO2 92 %.    Vent Mode: PSV;CPAP FiO2 (%):  [40 %-60 %] 40 % Set Rate:  [26 bmp] 26 bmp Vt Set:  [400 mL] 400 mL PEEP:  [5 cmH20] 5 cmH20 Pressure Support:  [5 cmH20-10 cmH20] 10 cmH20 Plateau Pressure:  [12 cmH20] 12 cmH20   Intake/Output Summary (Last 24 hours) at 02/09/2021 9563 Last data filed at 02/09/2021 0900 Gross per 24 hour  Intake 3404.42 ml  Output 2400 ml  Net 1004.42 ml   Filed Weights   02/07/21 0500 02/08/21 0500 02/09/21 0023  Weight: 102.7 kg 102.7 kg 102.3 kg     Physical exam: General: Crtitically ill-appearing elderly Caucasian female, s/p trach HEENT: [place]/AT, eyes anicteric.  Dry mucous membranes Neuro: Opens eyes with vocal stimuli, not following commands, withdrawing in both lower extremities Chest: Rhonchorous breath sounds all over, no wheezes or crackles Heart: Regular rate and rhythm, no murmurs or gallops Abdomen: Soft, nontender, nondistended, bowel sounds present Skin: No rash  Resolved Hospital Problem list   AKI  -resolved Seizure Like Activity   -Noted on 11/30, Ceribell negative 11/30.  Repeated on 12/1 negative as well.  Hypokalemia Assessment & Plan:   Acute Hypoxemic Respiratory Failure/ Sepsis secondary to Pneumococcal PNA  Pneumococcal PNA with Bacteremia (POA)  Probable new development of aspiration pneumonia in right upper lobe Acute Metabolic/infectious encephalopathy LP/MRI brain not explaining her acute illness Ceribell neg x 2, initial  EEG not explaining.  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  Acute urinary incontinence- External urinary cath  DM II with Hyperglycemia, improved Hypernatremia Continue free water flushes  Morbid obesity Continue tube feeds Dietitian is following Best Practice (right click and "Reselect all SmartList Selections" daily)  Diet/type: TF DVT prophylaxis: LMWH  GI prophylaxis: PPI Lines: N/A Foley:  N/A Code Status:  full code Last date of multidisciplinary goals of care discussion: 12/13: Patient's husband was updated at bedside, all questions answered Critical care time:     Total critical care time: 38 minutes  Performed by: Jacky Kindle   Critical care time was exclusive of separately billable procedures and treating other patients.   Critical care was necessary to treat or prevent imminent or life-threatening deterioration.   Critical care was time spent personally by me on the following activities: development of treatment plan with patient and/or surrogate as well as nursing, discussions with consultants, evaluation of patient's response to treatment, examination of patient, obtaining history from patient or surrogate, ordering and performing treatments and interventions, ordering and review of laboratory studies, ordering and  review of radiographic studies, pulse oximetry and re-evaluation of patient's condition.   Jacky Kindle MD [facility] See Amion for pager If no response to pager, please call [phone] until 7pm After 7pm, Please call E-link [phone]

## 2021-02-09 NOTE — Progress Notes (Addendum)
Nutrition Follow-up  DOCUMENTATION CODES:   Obesity unspecified  INTERVENTION:   After cortrak placement recommend  Initiate tube feeding via cortrak tube: Vital AF 1.2 at 55 ml/h (1320 ml per day) Prosource TF 45 ml TID  Provides 1704 kcal, 132 gm protein, 1003 ml free water daily    NUTRITION DIAGNOSIS:   Inadequate oral intake related to inability to eat as evidenced by NPO status. Ongoing.   GOAL:   Provide needs based on ASPEN/SCCM guidelines Met with TF at goal.   MONITOR:   Vent status, TF tolerance, Labs, Weight trends  REASON FOR ASSESSMENT:   Ventilator, Consult Enteral/tube feeding initiation and management  ASSESSMENT:   75 y.o. female with medical history of stroke in 06/2020, DM, HTN, HLD, and hypothyroidism. She was taken to the ED after being found to be increasingly confused x3 days after experiencing a fall.  Pt discussed during ICU rounds and with RN.  Pt having fevers and chest xray shows increasing RUL infiltrates per MD  RUL Pneumonia and pneumococcal bacteremia + Encephalopathy  Psych consulted but unable to complete assessment due to sedation on vent.  Pt with emesis, per RN NG had been pulled out and pushed back in. NG now removed with plan for cortrak 12/14.  Plan to wean to trach collar this afternoon/tomorrow.   11/30- intubation; OGT placement 12/1- initial RD assessment (remote) 12/5- failed extubation; re-intubation 12/10 [facility] for LTM  12/11 s/p trach placement     Medications reviewed and include: colace, folic acid, SSI, syntrhoid, melatonin, liquid MVI, protonix, miralax, thiamine  Precedex  Labs reviewed:  CBG's: 141-247 TG: 327 (12/11) Folate: 12.5 B12: 617  UOP: 1500 ml   Current TF:  Vital High Protein @ 50 ml/hr with 45 ml ProSource TF BID Provides: 1280 kcal and 127 grams protein    Diet Order:   Diet Order             Diet NPO time specified  Diet effective midnight                    EDUCATION NEEDS:   No education needs have been identified at this time  Skin:  Skin Assessment:  (maceration on bilateral buttocks)  Last BM:  300 ml via rectal tube  Height:   Ht Readings from Last 1 Encounters:  02/03/21 _0  (1.575 m)    Weight:   Wt Readings from Last 1 Encounters:  02/09/21 102.3 kg    Ideal Body Weight:  50 kg  BMI:  Body mass index is 41.25 kg/m.  Estimated Nutritional Needs:   Kcal:  1700-1900  Protein:  110-130 grams  Fluid:  >/= 1.8 L/day  Lockie Pares., RD, LDN, CNSC See AMiON for contact information

## 2021-02-09 NOTE — Progress Notes (Signed)
eLink Physician-Brief Progress Note Patient Name: ALDEN FEAGAN DOB: Jul 15, 1945 MRN: [id number]   Date of Service  02/09/2021  HPI/Events of Note  Patient with sub-optimal BP control despite PRN Labetalol 10 mg iv, heart rate is 64.  eICU Interventions  Hydralazine 10-20 mg iv Q 4 hours PRN SBP > 160 added for optimum BP control.        Ezekeil Bethel U Kailan Carmen 02/09/2021, 1:20 AM

## 2021-02-09 NOTE — Progress Notes (Signed)
°  Transition of Care (TOC) Screening Note   Patient Details  Name: Rhonda Winters Date of Birth: 1946-01-23   Transition of Care [facility] CM/SW Contact:    Benard Halsted, LCSW Phone Number: 02/09/2021, 10:11 AM    Transition of Care Department [facility]) has reviewed patient and no TOC needs have been identified at this time. We will continue to monitor patient advancement through interdisciplinary progression rounds. If new patient transition needs arise, please place a TOC consult.

## 2021-02-09 NOTE — Progress Notes (Signed)
Patient vomited immediately following administration of per tube meds. MD aware.

## 2021-02-09 NOTE — Consult Note (Signed)
[facility] Followup Psychiatric Evaluation   Service Date: February 09, 2021 LOS:  LOS: 14 days    Assessment  Rhonda Winters is a 75 y.o. female admitted medically for 01/25/2021  8:12 PM for confusion. 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   She was seen by Dr. Real Cons on 12/11 with an equivocal BFCRS and no response to ativan challenge. Plan was to see after pt extubated; communicated with primary team that pt became more alert/participative and was following commands so deferred until today. Over past 24 hours pt has been running fevers and dx with new pneumonia, on trach but still ventilated. Sedation has been weaned from propofol/dilaudid to precedex. Unable to perform BFCRS 2/2 pt not interactive; will not repeat today as risk > benefit.  We will focus on reducing sedating medications today in hopes of improving overall sensorium.   Diagnoses:  Active Hospital problems: Principal Problem:   Sepsis ([place]) Active Problems:   HTN (hypertension)   Diabetic neuropathy associated with type 2 diabetes mellitus ([place])   Hypothyroidism   CVA (cerebral vascular accident) ([place])   Pneumonia   Acute encephalopathy   ARF (acute renal failure) (HCC)   Streptococcal pneumonia ([place])   Acute respiratory failure with hypoxia ([place])   AMS (altered mental status)    Problems edited/added by me: No problems updated.  Plan  ## Safety and Observation Level:  - Based on my clinical evaluation, I estimate the patient to be at low risk of self harm in the current setting, stemming mostly from fall risk. - At this time, we recommend a routine level of observation. This decision is based on my review of the chart including patient's history and current presentation, interview of the patient, mental status examination,  and consideration of suicide risk including evaluating suicidal ideation, plan, intent, suicidal or self-harm behaviors, risk factors, and protective factors. This judgment is based on our ability to directly address suicide risk, implement suicide prevention strategies and develop a safety plan while the patient is in the clinical setting. Please contact our team if there is a concern that risk level has changed.   ## Medications:  -- STOP amitriptyline 25 mg QHS (anticholinergic) -- REDUCE quetiapine 100-->25 mg (?oversedating in neuroleptic naiive critically ill pt) -- CHANGE melatonin from QHS to q1800 -- agree with holding home duloxetine  ## Medical Decision Making Capacity:  Not formally assessed  ## Further Work-up:  -- none currently  ## Disposition:  -- per primary team  ##Legal Status   Thank you for this consult request. Recommendations have been communicated to the primary team.  We will continue to follow at this time.   [place] A Peregrine Nolt    Followup history  Relevant Aspects of Hospital Course:  Admitted on 01/25/2021 for AMS; decompensated and admitted on 11/30. Was intubated for about a week with significant encephalopathy; psychiatry consulted while still intubated. Notably pt with vesicular lesions on sacrum and receiving empiric tx for herpes encephalopathy.  Patient Report:  Patient is obtunded and unable to particiapte in interview  ROS:  Level 2 caveat  Collateral information:  Per bedside nurse -following commands yesterday, not today. At some point yesterday with significant psychomotor agitation, managed to pull urinary catheter with leg movement.   Psychiatric History:  Chart, nurse   Social History:  Deferred  Family History:  Deferred  The patient's family history is not on file.  Medical History: Past Medical History:  Diagnosis Date   Acquired hypothyroidism    Hypertension    Stroke ([place])    Type 2 diabetes mellitus ([place])      Surgical History: Past Surgical History:  Procedure Laterality Date   LOOP RECORDER INSERTION N/A 07/01/2020   Procedure: LOOP RECORDER INSERTION;  Surgeon: Constance Haw, MD;  Location: [facility];  Service: Cardiovascular;  Laterality: N/A;    Medications:   Current Facility-Administered Medications:    0.9 %  sodium chloride infusion, 250 mL, Intravenous, Continuous, Oletta Darter Virgina Evener, MD, Stopped at 02/05/21 1335   acetaminophen (TYLENOL) tablet 650 mg, 650 mg, Per Tube, Q6H PRN, 650 mg at 02/08/21 1322 **OR** acetaminophen (TYLENOL) suppository 650 mg, 650 mg, Rectal, Q6H PRN, Jacky Kindle, MD   acyclovir (ZOVIRAX) 200 MG/5ML suspension SUSP 800 mg, 800 mg, Per Tube, 5 X Daily, McQuaid, Douglas B, MD, 800 mg at 02/09/21 0951   albuterol (PROVENTIL) (2.5 MG/3ML) 0.083% nebulizer solution 2.5 mg, 2.5 mg, Nebulization, Q4H PRN, Simonne Maffucci B, MD   amitriptyline (ELAVIL) tablet 25 mg, 25 mg, Per Tube, QHS, Gleason, Otilio Carpen, PA-C, 25 mg at 02/08/21 2049   Ampicillin-Sulbactam (UNASYN) 3 g in sodium chloride 0.9 % 100 mL IVPB, 3 g, Intravenous, Q6H, Bertis Ruddy, RPH, Last Rate: 200 mL/hr at 02/09/21 1527, 3 g at 02/09/21 1527   chlorhexidine gluconate (MEDLINE KIT) (PERIDEX) 0.12 % solution 15 mL, 15 mL, Mouth Rinse, BID, Candee Furbish, MD, 15 mL at 02/09/21 7672   Chlorhexidine Gluconate Cloth 2 % PADS 6 each, 6 each, Topical, Daily, Candee Furbish, MD, 6 each at 02/09/21 0955   cloNIDine (CATAPRES - Dosed in mg/24 hr) patch 0.3 mg, 0.3 mg, Transdermal, Weekly, Tacy Learn, Currie Paris, MD, 0.3 mg at 02/09/21 1100   dexmedetomidine (PRECEDEX) 400 MCG/100ML (4 mcg/mL) infusion, 0.4-1.2 mcg/kg/hr, Intravenous, Titrated, Chand, Currie Paris, MD, Last Rate: 15.41 mL/hr at 02/09/21 1500, 0.6 mcg/kg/hr at 02/09/21 1500   docusate (COLACE) 50 MG/5ML liquid 100 mg, 100 mg, Per Tube, BID, Candee Furbish, MD, 100 mg at 02/08/21 2048   enoxaparin (LOVENOX) injection 40 mg, 40 mg,  Subcutaneous, Q24H, Candee Furbish, MD, 40 mg at 02/09/21 0849   [START ON 02/10/2021] feeding supplement (PROSource TF) liquid 45 mL, 45 mL, Per Tube, TID, Jacky Kindle, MD   [START ON 02/10/2021] feeding supplement (VITAL AF 1.2 CAL) liquid 1,000 mL, 1,000 mL, Per Tube, Continuous, Chand, Sudham, MD   folic acid (FOLVITE) tablet 1 mg, 1 mg, Per Tube, Daily, Green, Terri L, RPH, 1 mg at 02/09/21 0950   gabapentin (NEURONTIN) 250 MG/5ML solution 100 mg, 100 mg, Per Tube, TID, Gleason, Otilio Carpen, PA-C, 100 mg at 02/09/21 0947   Gerhardt's butt cream, , Topical, BID, Jacky Kindle, MD, Given at 02/09/21 1103   hydrALAZINE (APRESOLINE) injection 10-20 mg, 10-20 mg, Intravenous, Q4H PRN, Ogan, Okoronkwo U, MD, 20 mg at 02/09/21 0129   hydrochlorothiazide (HYDRODIURIL) tablet 25 mg, 25 mg, Per Tube, Daily, Chand, Sudham, MD, 25 mg at 02/09/21 0951   insulin aspart (novoLOG) injection 0-9 Units, 0-9 Units, Subcutaneous, Q4H, Rise Patience, MD, 1 Units at 02/09/21 1527   labetalol (NORMODYNE) injection 10-20 mg, 10-20 mg, Intravenous, Q2H PRN, Ogan, Okoronkwo U, MD, 20 mg at 02/09/21 0605   levothyroxine (SYNTHROID) tablet 125 mcg, 125 mcg, Per Tube, Q0600, Minda Ditto, RPH, 125 mcg at 02/09/21 0508   LORazepam (ATIVAN)  injection 2 mg, 2 mg, Intravenous, Once, Massengill, Nathan, MD   MEDLINE mouth rinse, 15 mL, Mouth Rinse, 10 times per day, Candee Furbish, MD, 15 mL at 02/09/21 1527   melatonin tablet 3 mg, 3 mg, Per Tube, QHS, Green, Terri L, RPH, 3 mg at 02/08/21 2049   midazolam (VERSED) injection 2 mg, 2 mg, Intravenous, Q6H PRN, Gleason, Otilio Carpen, PA-C, 2 mg at 02/09/21 0310   midazolam (VERSED) injection 2 mg, 2 mg, Intravenous, Once, Gleason, Otilio Carpen, PA-C   multivitamin liquid 15 mL, 15 mL, Per Tube, Daily, Green, Terri L, RPH, 15 mL at 02/09/21 0950   pantoprazole (PROTONIX) injection 40 mg, 40 mg, Intravenous, QHS, Candee Furbish, MD, 40 mg at 02/08/21 2048   polyethylene glycol  (MIRALAX / GLYCOLAX) packet 17 g, 17 g, Per Tube, Daily, Candee Furbish, MD, 17 g at 02/08/21 0847   QUEtiapine (SEROQUEL) tablet 100 mg, 100 mg, Per Tube, QHS, Gleason, Otilio Carpen, PA-C, 100 mg at 02/08/21 2049   sodium chloride flush (NS) 0.9 % injection 10-40 mL, 10-40 mL, Intracatheter, Q12H, McQuaid, Nathaneil Canary B, MD, 30 mL at 02/09/21 0955   sodium chloride flush (NS) 0.9 % injection 10-40 mL, 10-40 mL, Intracatheter, PRN, Juanito Doom, MD   [COMPLETED] thiamine 552m in normal saline (569m IVPB, 500 mg, Intravenous, Q8H, Stopped at 02/05/21 0550 **FOLLOWED BY** thiamine (B-1) 250 mg in sodium chloride 0.9 % 50 mL IVPB, 250 mg, Intravenous, Daily, Stopped at 02/09/21 1132 **FOLLOWED BY** [START ON 02/12/2021] thiamine (B-1) injection 100 mg, 100 mg, Intravenous, Daily, KhDonnetta SimpersMD  Allergies: Allergies  Allergen Reactions   Hydrocodone-Acetaminophen Nausea And Vomiting       Objective  Vital signs:  Temp:  [99.1 F (37.3 C)-101.5 F (38.6 C)] 99.7 F (37.6 C) (12/13 1544) Pulse Rate:  [63-101] 74 (12/13 1500) Resp:  [13-39] 36 (12/13 1126) BP: (135-194)/(67-91) 148/74 (12/13 1500) SpO2:  [92 %-100 %] 98 % (12/13 1500) FiO2 (%):  [40 %-60 %] 40 % (12/13 1200) Weight:  [102.3 kg] 102.3 kg (12/13 0023)  Physical Exam: Gen: Lying in ICU bed, marginally responsive to painful stimuli Pulm: trach Neuro: no spontaneous movement Psych: Not alert or oriented.   Psychiatric Specialty Exam:  Presentation  Mental Status Exam: Appearance:  lying in bed, ill-appearing, and has recent expensive manicure.   Attitude:   sedated and minimally interactive  Behavior/Psychomotor:  Minimal spontaneous movement  Speech/Language:   no audible speech as pt is sedated  Mood:  Unable to evaluate due to level of sedation  Affect:  Unable to evaluate due to level of sedation  Thought process:  Unable to evaluate due to level of sedation  Thought content:    Unable to evaluate due to  level of sedation  Perceptual disturbances:   Unable to evaluate due to level of sedation  Attention:  Unable to evaluate due to level of sedation  Concentration:  Unable to evaluate due to level of sedation  Orientation:  Unable to evaluate due to level of sedation  Memory:  Unable to evaluate due to level of sedation  Fund of knowledge:   Unable to evaluate due to level of sedation  Insight:    Unable to evaluate due to level of sedation  Judgment:   Unable to evaluate due to level of sedation  Impulse Control:  Unable to evaluate due to level of sedation   Blood pressure (!) 148/74, pulse 74, temperature 99.7 F (37.6 C), temperature  source Axillary, resp. rate (!) 36, height 5' 2"  (1.575 m), weight 102.3 kg, SpO2 98 %. Body mass index is 41.25 kg/m.

## 2021-02-09 NOTE — Progress Notes (Signed)
eLink Physician-Brief Progress Note Patient Name: Rhonda Winters DOB: May 25, 1945 MRN: [id number]   Date of Service  02/09/2021  HPI/Events of Note  Patient with sub-optimal BP control with 10 mg of Labetalol iv prn.  eICU Interventions  Labetalol dose changed to 10-20 mg iv PRN.        Kerry Kass Chayden Garrelts 02/09/2021, 5:58 AM

## 2021-02-09 NOTE — Progress Notes (Signed)
Pt placed on PS/CPAP 10/5 @ 40% and is tolerating well. RT will continue to monitor.

## 2021-02-09 NOTE — Progress Notes (Signed)
Pharmacy Antibiotic Note  Rhonda Winters is a 75 y.o. female admitted on 01/25/2021 presenting with pneumococcal pna + bacteremia s/p tx with ceftriaxone, now with worsening chest XR likely aspiration.  Pharmacy has been consulted for Unasyn dosing.  Plan: Unasyn 3g IV q 6  hours Monitor renal function, clinical progression and LOT  Height: _0  (157.5 cm) Weight: 102.3 kg (225 lb 8.5 oz) IBW/kg (Calculated) : 50.1  Temp (24hrs), Avg:100.2 F (37.9 C), Min:99.1 F (37.3 C), Max:101.5 F (38.6 C)  Recent Labs  Lab 02/05/21 1407 02/06/21 0418 02/07/21 0352 02/08/21 0318 02/09/21 0235  WBC 15.9*  --  14.3* 9.4 16.1*  CREATININE 1.46* 0.98 0.61 0.60 0.57    Estimated Creatinine Clearance: 68.1 mL/min (by C-G formula based on SCr of 0.57 mg/dL).    Allergies  Allergen Reactions   Hydrocodone-Acetaminophen Nausea And Vomiting    11/28 Azithromycin >> 11/30 11/28 Ceftriaxone >> 12/13 (did 14d from 11/30 since no dose 11/29) 11/30 Vancomycin >> 12/1  12/6 acyclovir (r/o shingle; husband said pt gets lesions several times a year)>>   11/28 BCx: BCID 11/29 in 4/4 bottles  Strep pneumo (pans sens) FINAL 11/30 BAL: neg FINAL 11/29 Strep pneumo Ag urine: neg 11/29 Legionella: neg 12/1 CSF:neg FINAL 12/6 varicella-zoster pcr:  12/6 HSV2+   Bertis Ruddy, PharmD Clinical Pharmacist ED Pharmacist Phone # [phone] 02/09/2021 9:24 AM

## 2021-02-10 ENCOUNTER — Inpatient Hospital Stay (HOSPITAL_COMMUNITY): Payer: Medicare Other

## 2021-02-10 LAB — TRIGLYCERIDES: Triglycerides: 82 mg/dL (ref <150)

## 2021-02-10 LAB — CBC
HCT: 24.3 % — ABNORMAL LOW (ref 36.0–46.0)
Hemoglobin: 7.3 g/dL — ABNORMAL LOW (ref 12.0–15.0)
MCH: 32 pg (ref 26.0–34.0)
MCHC: 30 g/dL (ref 30.0–36.0)
MCV: 106.6 fL — ABNORMAL HIGH (ref 80.0–100.0)
Platelets: 383 10*3/uL (ref 150–400)
RBC: 2.28 MIL/uL — ABNORMAL LOW (ref 3.87–5.11)
RDW: 14.6 % (ref 11.5–15.5)
WBC: 9.2 10*3/uL (ref 4.0–10.5)
nRBC: 0 % (ref 0.0–0.2)

## 2021-02-10 LAB — GLUCOSE, CAPILLARY
Glucose-Capillary: 125 mg/dL — ABNORMAL HIGH (ref 70–99)
Glucose-Capillary: 129 mg/dL — ABNORMAL HIGH (ref 70–99)
Glucose-Capillary: 132 mg/dL — ABNORMAL HIGH (ref 70–99)
Glucose-Capillary: 158 mg/dL — ABNORMAL HIGH (ref 70–99)
Glucose-Capillary: 159 mg/dL — ABNORMAL HIGH (ref 70–99)
Glucose-Capillary: 89 mg/dL (ref 70–99)

## 2021-02-10 LAB — BASIC METABOLIC PANEL
Anion gap: 7 (ref 5–15)
BUN: 25 mg/dL — ABNORMAL HIGH (ref 8–23)
CO2: 28 mmol/L (ref 22–32)
Calcium: 9.7 mg/dL (ref 8.9–10.3)
Chloride: 115 mmol/L — ABNORMAL HIGH (ref 98–111)
Creatinine, Ser: 0.65 mg/dL (ref 0.44–1.00)
GFR, Estimated: >60 mL/min (ref >60)
Glucose, Bld: 147 mg/dL — ABNORMAL HIGH (ref 70–99)
Potassium: 3.1 mmol/L — ABNORMAL LOW (ref 3.5–5.1)
Sodium: 150 mmol/L — ABNORMAL HIGH (ref 135–145)

## 2021-02-10 LAB — MAGNESIUM: Magnesium: 1.7 mg/dL (ref 1.7–2.4)

## 2021-02-10 LAB — PHOSPHORUS: Phosphorus: 3.2 mg/dL (ref 2.5–4.6)

## 2021-02-10 MED ORDER — POTASSIUM CHLORIDE 10 MEQ/100ML IV SOLN
10.0000 meq | INTRAVENOUS | Status: AC
  Administered 2021-02-10 (×4): 10 meq via INTRAVENOUS
  Filled 2021-02-10 (×4): qty 100

## 2021-02-10 MED ORDER — BETHANECHOL CHLORIDE 10 MG PO TABS
10.0000 mg | ORAL_TABLET | Freq: Three times a day (TID) | ORAL | Status: DC
  Administered 2021-02-10 – 2021-02-12 (×7): 10 mg
  Filled 2021-02-10 (×6): qty 1

## 2021-02-10 MED ORDER — BETHANECHOL CHLORIDE 10 MG PO TABS
10.0000 mg | ORAL_TABLET | Freq: Three times a day (TID) | ORAL | Status: DC
  Filled 2021-02-10: qty 1

## 2021-02-10 MED ORDER — MELATONIN 3 MG PO TABS
3.0000 mg | ORAL_TABLET | Freq: Every day | ORAL | Status: DC
  Administered 2021-02-10 – 2021-02-11 (×2): 3 mg
  Filled 2021-02-10 (×3): qty 1

## 2021-02-10 MED ORDER — HYDROCHLOROTHIAZIDE 12.5 MG PO TABS
12.5000 mg | ORAL_TABLET | Freq: Every day | ORAL | Status: DC
  Administered 2021-02-11 – 2021-02-12 (×2): 12.5 mg
  Filled 2021-02-10 (×3): qty 1

## 2021-02-10 MED ORDER — METOPROLOL TARTRATE 50 MG PO TABS
50.0000 mg | ORAL_TABLET | Freq: Three times a day (TID) | ORAL | Status: DC
  Administered 2021-02-10 – 2021-02-12 (×7): 50 mg
  Filled 2021-02-10 (×7): qty 1

## 2021-02-10 MED ORDER — AMLODIPINE BESYLATE 10 MG PO TABS
10.0000 mg | ORAL_TABLET | Freq: Every day | ORAL | Status: DC
Start: 2021-02-10 — End: 2021-02-10

## 2021-02-10 MED ORDER — FREE WATER
200.0000 mL | Status: DC
  Administered 2021-02-10 – 2021-02-12 (×14): 200 mL

## 2021-02-10 NOTE — Progress Notes (Signed)
Pt placed on PS/CPAP 10/5 @ 40% and is tolerating well. RT will continue to monitor.

## 2021-02-10 NOTE — TOC Progression Note (Addendum)
Transition of Care [facility]) - Progression Note    Patient Details  Name: Rhonda Winters MRN: [id number] Date of Birth: [birth year]  Transition of Care [facility]) CM/SW Contact  Ella Bodo, RN Phone Number: 02/10/2021, 4:53 PM  Clinical Narrative:    Pt s/p tracheostomy on 02/08/21; feel patient would likely be a candidate for LTAC.  MD, if you agree, please place [facility] consult for LTAC.  Thank you.   Addendum: 2518 Referral to both area LTAC facilities to evaluate for eligibility.    Expected Discharge Plan: Long Term Acute Care (LTAC) Barriers to Discharge: Continued Medical Work up  Expected Discharge Plan and Services Expected Discharge Plan: [place] (LTAC)   Discharge Planning Services: CM Consult   Living arrangements for the past 2 months: Single Family Home                                       Social Determinants of Health (SDOH) Interventions    Readmission Risk Interventions No flowsheet data found.  Reinaldo Raddle, RN, BSN  Trauma/Neuro ICU Case Manager [phone]

## 2021-02-10 NOTE — Progress Notes (Signed)
Neurology Progress Note   S:// No acute events overnight.  More awake and responsive. Appreciate psychiatry consultation and recommendations for reduction of sedation medications. O:// Current vital signs: BP 115/66    Pulse 96    Temp 99 F (37.2 C) (Axillary)    Resp (!) 33    Ht 5' 2"  (1.575 m)    Wt 103.5 kg    SpO2 98%    BMI 41.73 kg/m  Vital signs in last 24 hours: Temp:  [97.7 F (36.5 C)-100.2 F (37.9 C)] 99 F (37.2 C) (12/14 1200) Pulse Rate:  [58-164] 96 (12/14 1230) Resp:  [20-33] 33 (12/14 1203) BP: (103-194)/(66-171) 115/66 (12/14 1220) SpO2:  [95 %-100 %] 98 % (12/14 1230) FiO2 (%):  [40 %] 40 % (12/14 1203) Weight:  [103.5 kg] 103.5 kg (12/14 0500) General: Intubated, sedated, on propofol HEENT: Normocephalic/atraumatic Abdomen: Obese, nontender Extremities warm well perfused with trace edema Cardiovascular: Regular rhythm Respiratory: Vented Neurological exam Intubated, no sedation Tracking the examiner No restriction of extraocular movements Attempts to move both her lower extremities but is unable to go antigravity. Wiggles toes to commands.  Medications  Current Facility-Administered Medications:    0.9 %  sodium chloride infusion, 250 mL, Intravenous, Continuous, Anders Simmonds, MD, Last Rate: 10 mL/hr at 02/10/21 0442, 250 mL at 02/10/21 0442   acetaminophen (TYLENOL) tablet 650 mg, 650 mg, Per Tube, Q6H PRN, 650 mg at 02/08/21 1322 **OR** acetaminophen (TYLENOL) suppository 650 mg, 650 mg, Rectal, Q6H PRN, Jacky Kindle, MD   acyclovir (ZOVIRAX) 200 MG/5ML suspension SUSP 800 mg, 800 mg, Per Tube, 5 X Daily, McQuaid, Douglas B, MD, 800 mg at 02/09/21 0951   albuterol (PROVENTIL) (2.5 MG/3ML) 0.083% nebulizer solution 2.5 mg, 2.5 mg, Nebulization, Q4H PRN, [place] Bells, Douglas B, MD   Ampicillin-Sulbactam (UNASYN) 3 g in sodium chloride 0.9 % 100 mL IVPB, 3 g, Intravenous, Q6H, Bertis Ruddy, RPH, Last Rate: 200 mL/hr at 02/10/21 0948, Infusion  Verify at 02/10/21 0948   chlorhexidine gluconate (MEDLINE KIT) (PERIDEX) 0.12 % solution 15 mL, 15 mL, Mouth Rinse, BID, Candee Furbish, MD, 15 mL at 02/10/21 9833   Chlorhexidine Gluconate Cloth 2 % PADS 6 each, 6 each, Topical, Daily, Candee Furbish, MD, 6 each at 02/09/21 0955   cloNIDine (CATAPRES - Dosed in mg/24 hr) patch 0.3 mg, 0.3 mg, Transdermal, Weekly, Tacy Learn, Currie Paris, MD, 0.3 mg at 02/09/21 1100   docusate (COLACE) 50 MG/5ML liquid 100 mg, 100 mg, Per Tube, BID, Candee Furbish, MD, 100 mg at 02/08/21 2048   enoxaparin (LOVENOX) injection 40 mg, 40 mg, Subcutaneous, Q24H, Candee Furbish, MD, 40 mg at 02/10/21 0845   feeding supplement (PROSource TF) liquid 45 mL, 45 mL, Per Tube, TID, Jacky Kindle, MD   feeding supplement (VITAL AF 1.2 CAL) liquid 1,000 mL, 1,000 mL, Per Tube, Continuous, Chand, Sudham, MD, Last Rate: 55 mL/hr at 02/10/21 1308, 1,000 mL at 82/50/53 9767   folic acid (FOLVITE) tablet 1 mg, 1 mg, Per Tube, Daily, Green, Terri L, RPH, 1 mg at 02/09/21 0950   free water 200 mL, 200 mL, Per Tube, Q4H, Chand, Sudham, MD, 200 mL at 02/10/21 1309   gabapentin (NEURONTIN) 250 MG/5ML solution 100 mg, 100 mg, Per Tube, TID, Gleason, Otilio Carpen, PA-C, 100 mg at 02/09/21 3419   Gerhardt's butt cream, , Topical, BID, Jacky Kindle, MD, 1 application at 37/90/24 2150   hydrALAZINE (APRESOLINE) injection 10-20 mg, 10-20 mg, Intravenous, Q4H PRN, Ogan, Okoronkwo U,  MD, 20 mg at 02/10/21 1129   [START ON 02/11/2021] hydrochlorothiazide (HYDRODIURIL) tablet 12.5 mg, 12.5 mg, Per Tube, Daily, Chand, Sudham, MD   insulin aspart (novoLOG) injection 0-9 Units, 0-9 Units, Subcutaneous, Q4H, Rise Patience, MD, 1 Units at 02/10/21 0444   labetalol (NORMODYNE) injection 10-20 mg, 10-20 mg, Intravenous, Q2H PRN, Frederik Pear, MD, 20 mg at 02/10/21 1201   levothyroxine (SYNTHROID) tablet 125 mcg, 125 mcg, Per Tube, Q0600, Minda Ditto, RPH, 125 mcg at 02/09/21 0508   LORazepam  (ATIVAN) injection 2 mg, 2 mg, Intravenous, Once, Massengill, Ovid Curd, MD   MEDLINE mouth rinse, 15 mL, Mouth Rinse, 10 times per day, Candee Furbish, MD, 15 mL at 02/10/21 0540   melatonin tablet 3 mg, 3 mg, Per Tube, QHS, Jacky Kindle, MD   metoprolol tartrate (LOPRESSOR) tablet 50 mg, 50 mg, Per Tube, Q8H, Jacky Kindle, MD   midazolam (VERSED) injection 2 mg, 2 mg, Intravenous, Q6H PRN, Gleason, Otilio Carpen, PA-C, 2 mg at 02/10/21 1217   midazolam (VERSED) injection 2 mg, 2 mg, Intravenous, Once, Gleason, Otilio Carpen, PA-C   multivitamin liquid 15 mL, 15 mL, Per Tube, Daily, Green, Terri L, RPH, 15 mL at 02/09/21 0950   pantoprazole (PROTONIX) injection 40 mg, 40 mg, Intravenous, QHS, Candee Furbish, MD, 40 mg at 02/09/21 2152   polyethylene glycol (MIRALAX / GLYCOLAX) packet 17 g, 17 g, Per Tube, Daily, Candee Furbish, MD, 17 g at 02/08/21 0847   potassium chloride 10 mEq in 100 mL IVPB, 10 mEq, Intravenous, Q1 Hr x 4, Chand, Sudham, MD, Last Rate: 100 mL/hr at 02/10/21 1246, 10 mEq at 02/10/21 1246   QUEtiapine (SEROQUEL) tablet 25 mg, 25 mg, Per Tube, QHS, Cinderella, Margaret A   sodium chloride flush (NS) 0.9 % injection 10-40 mL, 10-40 mL, Intracatheter, Q12H, Simonne Maffucci B, MD, 40 mL at 02/09/21 2151   sodium chloride flush (NS) 0.9 % injection 10-40 mL, 10-40 mL, Intracatheter, PRN, Juanito Doom, MD   [COMPLETED] thiamine 551m in normal saline (56m IVPB, 500 mg, Intravenous, Q8H, Stopped at 02/05/21 0550 **FOLLOWED BY** thiamine (B-1) 250 mg in sodium chloride 0.9 % 50 mL IVPB, 250 mg, Intravenous, Daily, Last Rate: 100 mL/hr at 02/10/21 1139, 250 mg at 02/10/21 1139 **FOLLOWED BY** [START ON 02/12/2021] thiamine (B-1) injection 100 mg, 100 mg, Intravenous, Daily, KhDonnetta SimpersMD Labs CBC    Component Value Date/Time   WBC 9.2 02/10/2021 0523   RBC 2.28 (L) 02/10/2021 0523   HGB 7.3 (L) 02/10/2021 0523   HCT 24.3 (L) 02/10/2021 0523   PLT 383 02/10/2021 0523   MCV  106.6 (H) 02/10/2021 0523   MCH 32.0 02/10/2021 0523   MCHC 30.0 02/10/2021 0523   RDW 14.6 02/10/2021 0523   LYMPHSABS 0.6 (L) 01/26/2021 0302   MONOABS 0.2 01/26/2021 0302   EOSABS 0.0 01/26/2021 0302   BASOSABS 0.1 01/26/2021 0302    CMP     Component Value Date/Time   NA 150 (H) 02/10/2021 0523   K 3.1 (L) 02/10/2021 0523   CL 115 (H) 02/10/2021 0523   CO2 28 02/10/2021 0523   GLUCOSE 147 (H) 02/10/2021 0523   BUN 25 (H) 02/10/2021 0523   CREATININE 0.65 02/10/2021 0523   CALCIUM 9.7 02/10/2021 0523   PROT 5.6 (L) 02/03/2021 1446   ALBUMIN 2.2 (L) 02/03/2021 1446   AST 23 02/03/2021 1446   ALT 14 02/03/2021 1446   ALKPHOS 48 02/03/2021 1446   BILITOT  2.2 (H) 02/03/2021 1446   GFRNONAA >60 02/10/2021 0523   Imaging I have reviewed images in epic and the results pertinent to this consultation are: MR Brain x2 in the past 8 days- no acute chages. Chornic Right PCA infarct. 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 HLD, hypothyrodisim, coming in for eval of AMS. Found to have Pneumococcal pneumonia for which she is being treated. Became agitated and hypoxic requiring intubation. Mentation seems to be far from baseline. Work up to include EEG, MR and LP all unremarkable. Question of viral lesions on the back c/f HSV - but CSF had zero WBCs making HSV unlikely. A psychiatric etiology should be kept in the differential but she does not have an extensive history pointing.  Appreciate psychiatry consultation. MRI C-spine obtained for hyperreflexia-unremarkable 2 days of EEG unremarkable for seizures and was discontinued. Psychiatry was consulted-sedation medications significantly reduced and her mentation appears to be much improved.   Recommendations:  Continue to reduce sedating medications-supportive care per primary team. Will continue to follow Plan discussed with Gerald Leitz from the PCCM  team.  -- Amie Portland, MD Neurologist [facility] Pager: [phone]

## 2021-02-10 NOTE — Progress Notes (Signed)
Pt placed on ATC 40% and is tolerating well. RT will continue to monitor.

## 2021-02-10 NOTE — Progress Notes (Signed)
Pt in SVT about 3 min. (Rate 160s.) Will cont to monitor.

## 2021-02-10 NOTE — Progress Notes (Signed)
Pt placed back on ventilator due to SVT reoccurring. RT will continue to monitor.

## 2021-02-10 NOTE — Progress Notes (Signed)
MD at bedside to evaluate SVT.

## 2021-02-10 NOTE — Progress Notes (Signed)
In and out of SVT. Pt has been placed back on vent. She is not agitated. Provider notified, will cont to monitor.

## 2021-02-10 NOTE — Progress Notes (Signed)
Pt unable to void for 7 hr. Attempted to I&O cath pt. Flexiseal has leaked. Skin with raw MASD, cleaning is painful for pt. 5 RNplus sedation required to visualize urethra. Order obtained to temporarily replace foley. Retention meds ordered. Skin thoroughly cleansed, Gerhardt's ointment reapplied.

## 2021-02-10 NOTE — Progress Notes (Signed)
NAME:  Rhonda Winters, MRN:  [id number], DOB:  May 13, 1945, LOS: 48 ADMISSION DATE:  01/25/2021, CONSULTATION DATE:  01/27/21 REFERRING MD:  Florene Glen, CHIEF COMPLAINT:  AMS   BRIEF  75 year old woman w/ hx of metabolic syndrome presenting with increasing confusion.  Workup has revealed RUL Pneumonia and pneumococcal bacteremia.  Unfortunately became more agitated through day, given ativan but then became obtunded with increased WOB.  Initial dose flumazenil ineffective.  Transferred to ICU and PCCM consulted.     Pertinent  Medical History  Hypothyroidism HTN Prior stroke T2DM  Significant Hospital Events: Including procedures, antibiotic start and stop dates in addition to other pertinent events   11/29 admitted 11/30 decompensation, intubation.  Attempted LP. 12/1 seizure-like activity, Ceribell in progress.  AED's initiated for seizure like movement.  IR LP. CT Head with no acute intracranial abnormalities, chronic small vessel disease, chronic R PCA territory infarct 12.2 - Afebrile . RN reports no seizure activity overnight.  Propofol turned off this am, remains on 50 mcg fentanyl. D5 turned off with hyperglycemia/ Pt waking, following simple commands intermittently, purposeful movement . Blood cultures with pan sensitive strep pneumo. Vent - 40-50% FiO2, PEEP 5 Glucose range 177-185 12/3 - given lasix yesterday -> then soft BP -> needed fluids. Had SVT ->on cardizem and resolved. On 50% fio2. On fent gtt/diprivan gtt (prior intolreance to precedex) -> agitated encephalopathy +. Afebrile 12/5 SBT done on prop/fent and failed 2/2 small TV, trial Precedex again.   Failed extubation->reintubated 12/6 No overnight events, calm on Precedex/Fentanyl 12/7 Diuresed well, overnight hypotension with transient pressors resolved this AM, no improvement in encephalopathy, vesicular lesions found on sacrum yesterday, viral cultures pending 12/8 Continue to remains significantly encephalopathic. Eyes  are spontaneously open and movement seen in all extremities but she will no track and unable to follow any commands on dilaudid and propofol drip   Interim History / Subjective:  No overnight issues Patient remained afebrile after change of antibiotics with Unasyn  Objective   Blood pressure (!) 154/80, pulse 67, temperature 97.7 F (36.5 C), temperature source Axillary, resp. rate 20, height 5\' 2"  (1.575 m), weight 103.5 kg, SpO2 100 %.    Vent Mode: CPAP;PSV FiO2 (%):  [40 %] 40 % Set Rate:  [26 bmp] 26 bmp Vt Set:  [400 mL] 400 mL PEEP:  [5 cmH20] 5 cmH20 Pressure Support:  [10 cmH20] 10 cmH20 Plateau Pressure:  [9 cmH20-21 cmH20] 9 cmH20   Intake/Output Summary (Last 24 hours) at 02/10/2021 8264 Last data filed at 02/10/2021 0700 Gross per 24 hour  Intake 1430.56 ml  Output 1975 ml  Net -544.44 ml   Filed Weights   02/08/21 0500 02/09/21 0023 02/10/21 0500  Weight: 102.7 kg 102.3 kg 103.5 kg     Physical exam: General: Crtitically ill-appearing elderly Caucasian female, s/p trach HEENT: [place]/AT, eyes anicteric.  Dry mucous membranes Neuro: Opens eyes with vocal stimuli, following simple commands, wiggling toes Chest: Rhonchorous breath sounds all over, no wheezes or crackles Heart: Regular rate and rhythm, no murmurs or gallops Abdomen: Soft, nontender, nondistended, bowel sounds present Skin: No rash  Resolved Hospital Problem list   AKI  -resolved Seizure Like Activity   -Noted on 11/30, Ceribell negative 11/30.  Repeated on 12/1 negative as well.   Assessment & Plan:  Acute Hypoxemic Respiratory Failure/ Sepsis secondary to Pneumococcal PNA  Pneumococcal PNA with Bacteremia (POA) Probable new development of aspiration pneumonia in right upper lobe Acute Metabolic/infectious encephalopathy Patient is  tolerating spontaneous breathing trial Will try trach collar today Sepsis indices are improving She had pneumococcal bacteremia and pneumonia that can lead to  altered mental status due to infectious encephalopathy Watch for respiratory distress Keep sedation off Decrease Seroquel to 25 mg at night Continue IV Unasyn Respiratory culture is growing polymicrobial's  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 Continue acyclovir  Acute urinary incontinence- External urinary cath  DM II with Hyperglycemia, improved Hypernatremia Hypokalemia Continue free water flushes Continue aggressive electrolyte supplement  Morbid obesity Continue tube feeds Dietitian is following Best Practice (right click and "Reselect all SmartList Selections" daily)  Diet/type: TF DVT prophylaxis: LMWH  GI prophylaxis: PPI Lines: N/A Foley:  N/A Code Status:  full code Last date of multidisciplinary goals of care discussion: 12/13: Patient's husband was updated at bedside, all questions answered Critical care time:     Total critical care time: 34 minutes  Performed by: Jacky Kindle   Critical care time was exclusive of separately billable procedures and treating other patients.   Critical care was necessary to treat or prevent imminent or life-threatening deterioration.   Critical care was time spent personally by me on the following activities: development of treatment plan with patient and/or surrogate as well as nursing, discussions with consultants, evaluation of patient's response to treatment, examination of patient, obtaining history from patient or surrogate, ordering and performing treatments and interventions, ordering and review of laboratory studies, ordering and review of radiographic studies, pulse oximetry and re-evaluation of patient's condition.   Jacky Kindle MD [facility] See Amion for pager If no response to pager, please call [phone] until 7pm After 7pm, Please call E-link [phone]

## 2021-02-10 NOTE — Procedures (Signed)
Cortrak  Person Inserting Tube:  Esaw Dace, RD Tube Type:  Cortrak - 43 inches Tube Size:  10 Tube Location:  Right nare Initial Placement:  Stomach Secured by: Bridle Technique Used to Measure Tube Placement:  Marking at nare/corner of mouth Cortrak Secured At:  63 cm  Cortrak Tube Team Note:  Consult received to place a Cortrak feeding tube.   X-ray is required, abdominal x-ray has been ordered by the Cortrak team. Please confirm tube placement before using the Cortrak tube.   If the tube becomes dislodged please keep the tube and contact the Cortrak team at [email or web address] ([email or web address]) for replacement.  If after hours and replacement cannot be delayed, place a NG tube and confirm placement with an abdominal x-ray.    Kerman Passey MS, RDN, LDN, CNSC Registered Dietitian III Clinical Nutrition RD Pager and On-Call Pager Number Located in [place]

## 2021-02-11 LAB — CULTURE, RESPIRATORY W GRAM STAIN: Culture: NORMAL

## 2021-02-11 LAB — CBC
HCT: 25.9 % — ABNORMAL LOW (ref 36.0–46.0)
Hemoglobin: 8 g/dL — ABNORMAL LOW (ref 12.0–15.0)
MCH: 32.5 pg (ref 26.0–34.0)
MCHC: 30.9 g/dL (ref 30.0–36.0)
MCV: 105.3 fL — ABNORMAL HIGH (ref 80.0–100.0)
Platelets: 489 10*3/uL — ABNORMAL HIGH (ref 150–400)
RBC: 2.46 MIL/uL — ABNORMAL LOW (ref 3.87–5.11)
RDW: 14.6 % (ref 11.5–15.5)
WBC: 10.4 10*3/uL (ref 4.0–10.5)
nRBC: 0 % (ref 0.0–0.2)

## 2021-02-11 LAB — BASIC METABOLIC PANEL
Anion gap: 8 (ref 5–15)
BUN: 25 mg/dL — ABNORMAL HIGH (ref 8–23)
CO2: 27 mmol/L (ref 22–32)
Calcium: 9.1 mg/dL (ref 8.9–10.3)
Chloride: 114 mmol/L — ABNORMAL HIGH (ref 98–111)
Creatinine, Ser: 0.81 mg/dL (ref 0.44–1.00)
GFR, Estimated: >60 mL/min (ref >60)
Glucose, Bld: 170 mg/dL — ABNORMAL HIGH (ref 70–99)
Potassium: 2.6 mmol/L — CL (ref 3.5–5.1)
Sodium: 149 mmol/L — ABNORMAL HIGH (ref 135–145)

## 2021-02-11 LAB — PHOSPHORUS: Phosphorus: 2.8 mg/dL (ref 2.5–4.6)

## 2021-02-11 LAB — GLUCOSE, CAPILLARY
Glucose-Capillary: 166 mg/dL — ABNORMAL HIGH (ref 70–99)
Glucose-Capillary: 164 mg/dL — ABNORMAL HIGH (ref 70–99)
Glucose-Capillary: 154 mg/dL — ABNORMAL HIGH (ref 70–99)
Glucose-Capillary: 124 mg/dL — ABNORMAL HIGH (ref 70–99)
Glucose-Capillary: 167 mg/dL — ABNORMAL HIGH (ref 70–99)
Glucose-Capillary: 153 mg/dL — ABNORMAL HIGH (ref 70–99)

## 2021-02-11 LAB — MAGNESIUM: Magnesium: 1.7 mg/dL (ref 1.7–2.4)

## 2021-02-11 MED ORDER — MAGNESIUM SULFATE 2 GM/50ML IV SOLN
2.0000 g | Freq: Once | INTRAVENOUS | Status: AC
  Administered 2021-02-11: 2 g via INTRAVENOUS
  Filled 2021-02-11: qty 50

## 2021-02-11 MED ORDER — JEVITY 1.2 CAL PO LIQD
1000.0000 mL | ORAL | Status: DC
  Administered 2021-02-11: 1000 mL
  Filled 2021-02-11 (×4): qty 1000

## 2021-02-11 MED ORDER — POTASSIUM CHLORIDE 10 MEQ/100ML IV SOLN
10.0000 meq | INTRAVENOUS | Status: AC
  Administered 2021-02-11 (×4): 10 meq via INTRAVENOUS
  Filled 2021-02-11 (×4): qty 100

## 2021-02-11 MED ORDER — POTASSIUM CHLORIDE 10 MEQ/50ML IV SOLN
10.0000 meq | INTRAVENOUS | Status: AC
  Administered 2021-02-11 (×4): 10 meq via INTRAVENOUS
  Filled 2021-02-11 (×4): qty 50

## 2021-02-11 MED ORDER — ADULT MULTIVITAMIN W/MINERALS CH
1.0000 | ORAL_TABLET | Freq: Every day | ORAL | Status: DC
  Administered 2021-02-11 – 2021-02-12 (×2): 1
  Filled 2021-02-11 (×2): qty 1

## 2021-02-11 MED ORDER — PROSOURCE TF PO LIQD
90.0000 mL | Freq: Two times a day (BID) | ORAL | Status: DC
  Administered 2021-02-11 – 2021-02-12 (×2): 90 mL
  Filled 2021-02-11 (×2): qty 90

## 2021-02-11 MED ORDER — POTASSIUM CHLORIDE 20 MEQ PO PACK
40.0000 meq | PACK | Freq: Once | ORAL | Status: AC
  Administered 2021-02-11: 40 meq
  Filled 2021-02-11: qty 2

## 2021-02-11 NOTE — Discharge Summary (Addendum)
Physician Discharge Summary  Patient ID: JAZZALYNN RHUDY MRN: [id number] DOB/AGE: 1945/12/28 75 y.o.  Admit date: 01/25/2021 Discharge date: 02/12/2021  Problem List Principal Problem:   Sepsis ([place]) Active Problems:   HTN (hypertension)   Diabetic neuropathy associated with type 2 diabetes mellitus ([place])   Hypothyroidism   CVA (cerebral vascular accident) ([place])   Pneumonia   Acute encephalopathy   ARF (acute renal failure) (H[facility])   Streptococcal pneumonia ([facility])   Acute respiratory failure with hypoxia (H[facility])   AMS (altered mental status)  HPI:  75 yo F PMH recent CVA, HTN, DM, Hypothyroidism, HLD presented to ED 01/26/21 with progressive confusion. Pt sustained a fall 3 days prior to presentation which was felt to coincide with confusion. In ED workup revealed RUL PNA. CT H was without acute abnormality. Initially she was hypotensive but this improved with IVF. She was admitted to [facility] for further workup and care for suspected sepsis due to PNA.  Hospital Course:  11/29 admitted to [facility] for sepsis due to PNA 11/30 decompensated, transferred to ICU, intubated. LP attempted 12/1 AEDs initiated for seizure like movements. Repeat CT H without acute abnormality 12/2 No seizure activity, when sedation weaned pt following commands. Bcx did result with pan sensitive strep pneumo  12/3 hypotensive after diuresis, given volume back. Had SVT but this improved on cardizem 12/5 extubated but failed, reintubated 12/7 transient hypotension, received pressors.  12/11 trach 12/13 starting unasyn for suspected aspiration PNA 12/15 trach collar 12/16 pending discharge to [facility]    Physical Exam:   General: Chronically and acutely ill appearing older adult F. Reclined in bed NAD  HEENT: NCAT. Pink mm. Moderate tan tracheal secretions. Trach secure.  Neuro: Awake alert. Following simple commands. Globally weak.  Chest: Rhonchi bilaterally. Symmetrical chest expansion, even unlabored on trach  collar  Heart: rr s1s2 cap refill < 3 sec.  Abdomen: soft ndnt + bowel sounds  Skin: pale c/d  Plan:  Acute metabolic encephalopathy -minimize sedating medications (home gabapentin has been decreased, most other home medications are still held) -encourage sleep/wake cycle -treat metabolic abnormalities as indicated  Acute hypoxemic respiratory failure S/p tracheostomy Aspiration PNA -continue trach collar -pulm hygiene -On unasyn for suspected aspiration PNA (started 12/13 -- ordered through 12/18)  HTN -HCTZ 12.5 mg qD, metop 50mg  TID, and weekly 0.3mg  clonidine patch   Hypokalemia Hypernatremia -Free water flushes -- Has been on 269ml Free Water q4hr -replace K as needed   DM2 -SSI (was on metformin at home)   Hypothyroidism -synthroid 14mcg qD  HSV lesions -s/p acyclovir course  Morbid obesity Inadequate PO intake -EN -- Has been on Jevity 1.2 30ml/hr, Prosource 35ml BID, multivitamins  Sepsis due to pneumococcal bacteremia and pneumococcal PNA - improved  AKI - improved  Seizure like activity - improved   Labs at discharge Lab Results  Component Value Date   CREATININE 0.60 02/12/2021   BUN 21 02/12/2021   NA 146 (H) 02/12/2021   K 3.6 02/12/2021   CL 114 (H) 02/12/2021   CO2 27 02/12/2021   Lab Results  Component Value Date   WBC 10.4 02/11/2021   HGB 8.0 (L) 02/11/2021   HCT 25.9 (L) 02/11/2021   MCV 105.3 (H) 02/11/2021   PLT 489 (H) 02/11/2021   Lab Results  Component Value Date   ALT 14 02/03/2021   AST 23 02/03/2021   ALKPHOS 48 02/03/2021   BILITOT 2.2 (H) 02/03/2021   Lab Results  Component Value Date   INR  1.1 01/25/2021   INR 0.9 06/29/2020    Current radiology studies No results found.  Disposition:  Appropriate for discharge to [facility]. Accepted and bed available 02/12/2021  Discharge disposition: [place] Not Defined      Discharge Instructions     Home infusion  instructions   Complete by: As directed    Patient being discharged to [facility] infusion not needed at this time.   Instructions: Flushing of vascular access device: 0.9% NaCl pre/post medication administration and prn patency; Heparin 100 u/ml, 18ml for implanted ports and Heparin 10u/ml, 76ml for all other central venous catheters.   Home infusion instructions   Complete by: As directed    Instructions: Flushing of vascular access device: 0.9% NaCl pre/post medication administration and prn patency; Heparin 100 u/ml, 34ml for implanted ports and Heparin 10u/ml, 77ml for all other central venous catheters.      Allergies as of 02/12/2021       Reactions   Hydrocodone-acetaminophen Nausea And Vomiting        Medication List     STOP taking these medications    amitriptyline 25 MG tablet Commonly known as: ELAVIL   ascorbic acid 500 MG tablet Commonly known as: VITAMIN C   aspirin 325 MG tablet   atenolol 50 MG tablet Commonly known as: TENORMIN   atorvastatin 80 MG tablet Commonly known as: LIPITOR   BIOTIN PO   cetirizine 10 MG tablet Commonly known as: ZYRTEC   diclofenac 75 MG EC tablet Commonly known as: VOLTAREN   DULoxetine 60 MG capsule Commonly known as: CYMBALTA   furosemide 20 MG tablet Commonly known as: LASIX   gabapentin 800 MG tablet Commonly known as: NEURONTIN Replaced by: gabapentin 250 MG/5ML solution   losartan 50 MG tablet Commonly known as: Cozaar   metFORMIN 500 MG 24 hr tablet Commonly known as: GLUCOPHAGE-XR   vitamin E 180 MG (400 UNITS) capsule Generic drug: vitamin E       TAKE these medications    acetaminophen 500 MG tablet Commonly known as: TYLENOL Take 1,000 mg by mouth every 6 (six) hours as needed for moderate pain.   Ampicillin-Sulbactam 3 g in sodium chloride 0.9 % 100 mL Inject 3 g into the vein every 6 (six) hours.   bethanechol 10 MG tablet Commonly known as: URECHOLINE Place 1  tablet (10 mg total) into feeding tube 3 (three) times daily.   cloNIDine 0.3 mg/24hr patch Commonly known as: CATAPRES - Dosed in mg/24 hr Place 1 patch (0.3 mg total) onto the skin once a week. Start taking on: February 16, 2021   gabapentin 250 MG/5ML solution Commonly known as: NEURONTIN Place 2 mLs (100 mg total) into feeding tube 3 (three) times daily. Replaces: gabapentin 800 MG tablet   hydrochlorothiazide 12.5 MG tablet Commonly known as: HYDRODIURIL Place 1 tablet (12.5 mg total) into feeding tube daily. Start taking on: February 13, 2021 What changed:  medication strength how much to take how to take this   insulin aspart 100 UNIT/ML injection Commonly known as: novoLOG Inject 0-9 Units into the skin every 4 (four) hours.   levothyroxine 125 MCG tablet Commonly known as: SYNTHROID Take 125 mcg by mouth daily before breakfast.   metoprolol tartrate 50 MG tablet Commonly known as: LOPRESSOR Place 1 tablet (50 mg total) into feeding tube every 8 (eight) hours.   thiamine 100 MG/ML injection Commonly known as: B-1 Inject 1 mL (100 mg total) into  the vein daily. Start taking on: February 13, 2021               Home Infusion Instuctions  (From admission, onward)           Start     Ordered   02/12/21 0000  Home infusion instructions       Comments: Patient being discharged to [facility] infusion not needed at this time.  Question:  Instructions  Answer:  Flushing of vascular access device: 0.9% NaCl pre/post medication administration and prn patency; Heparin 100 u/ml, 57ml for implanted ports and Heparin 10u/ml, 69ml for all other central venous catheters.   02/12/21 0938   02/12/21 0000  Home infusion instructions       Question:  Instructions  Answer:  Flushing of vascular access device: 0.9% NaCl pre/post medication administration and prn patency; Heparin 100 u/ml, 38ml for implanted ports and Heparin 10u/ml, 32ml for all other  central venous catheters.   02/12/21 0942              Discharged Condition: stable  Time spent on discharge greater than 40 minutes.  Vital signs at Discharge. Temp:  [98.4 F (36.9 C)-100.1 F (37.8 C)] 98.9 F (37.2 C) (12/16 1200) Pulse Rate:  [64-83] 72 (12/16 1200) Resp:  [24-32] 25 (12/16 1200) BP: (127-202)/(63-124) 155/64 (12/16 1200) SpO2:  [92 %-97 %] 96 % (12/16 1200) FiO2 (%):  [40 %] 40 % (12/16 1129) Weight:  [106.3 kg] 106.3 kg (12/16 0407)     Signed:  Eliseo Gum MSN, AGACNP-BC [facility] 02/12/2021, 2:58 PM

## 2021-02-11 NOTE — Progress Notes (Signed)
K+ 2.6, Mg 1.7 Replaced per protocol

## 2021-02-11 NOTE — Evaluation (Signed)
[facility] - Evaluation Patient Details  Name: Rhonda Winters MRN: [id number] Date of Birth: 1945/08/02  Today's Date: 02/11/2021 Time: 1350-1403 SLP Time Calculation (min) (ACUTE ONLY): 13 min  Past Medical History:  Past Medical History:  Diagnosis Date   Acquired hypothyroidism    Hypertension    Stroke ([place])    Type 2 diabetes mellitus ([place])    Past Surgical History:  Past Surgical History:  Procedure Laterality Date   LOOP RECORDER INSERTION N/A 07/01/2020   Procedure: LOOP RECORDER INSERTION;  Surgeon: Constance Haw, MD;  Location: [facility];  Service: Cardiovascular;  Laterality: N/A;   HPI:  75 year old woman w/ hx of metabolic syndrome presenting with increasing confusion.  Workup has revealed RUL Pneumonia and pneumococcal bacteremia. MRI: Chronic right PCA territorial infarct. Mentation has improved as sedation has decreased. Intubated 11/30, failed extubation, reintubated 12/5. Trached 12/11.    Assessment / Plan / Recommendation  Clinical Impression  Pt demonstrates abiltiy to follow commands, but no passage of air to upper airway with brief PMSV placement. Air pressure sensed after brief placement. Pts cuff fully deflated at baseline and also rechecked, SLP repositioned head and neck fully upright with no improvement. Touching PMSV to trach hub during coughing with no air movement to upper airway, no phonation audible. Pt clearly dislikes attempts. Will continue efforts, suspect an eventual trach change may be needed for success. Pt still on vent at night at this time. Will need cognitive linguistic eval to establish alternate communicate method in the meantime. SLP Visit Diagnosis: Aphonia (R49.1)    SLP Assessment  Patient needs continued [facility]    Recommendations for follow up therapy are one component of a multi-disciplinary discharge planning process, led by the attending physician.  Recommendations may  be updated based on patient status, additional functional criteria and insurance authorization.  Follow Up Recommendations  Skilled nursing-short term rehab (<3 hours/day)    Assistance Recommended at Discharge    Functional Status Assessment    Frequency and Duration min 2x/week  2 weeks    PMSV Trial PMSV was placed for: 5 seconds Able to redirect subglottic air through upper airway: No Able to Attain Phonation: No Able to Expectorate Secretions: No Level of Secretion Expectoration with PMSV: Not observed   Tracheostomy Tube  Additional Tracheostomy Tube Assessment Trach Collar Period: 6 hours Secretion Description: minimal Frequency of Tracheal Suctioning: every 1-3 hours Level of Secretion Expectoration: Tracheal    Vent Dependency  Vent Dependent: Yes FiO2 (%): 40 % Nocturnal Vent: Yes    Cuff Deflation Trial Tolerated Cuff Deflation: Yes Length of Time for Cuff Deflation Trial: baseline         Carrine Kroboth, Katherene Ponto 02/11/2021, 2:27 PM

## 2021-02-11 NOTE — Progress Notes (Signed)
Nutrition Follow-up  DOCUMENTATION CODES:   Obesity unspecified  INTERVENTION:   Tube feeding via cortrak tube: Vital AF 1.2 at 55 ml/h (1320 ml per day) Prosource TF 45 ml TID  Provides 1704 kcal, 132 gm protein, 1003 ml free water daily  200 ml free water every 4 hours Total free water: 2203 ml   NUTRITION DIAGNOSIS:   Inadequate oral intake related to inability to eat as evidenced by NPO status. Ongoing.   GOAL:   Provide needs based on ASPEN/SCCM guidelines Met with TF at goal.   MONITOR:   Vent status, TF tolerance, Labs, Weight trends  REASON FOR ASSESSMENT:   Ventilator, Consult Enteral/tube feeding initiation and management  ASSESSMENT:   75 y.o. female with medical history of stroke in 06/2020, DM, HTN, HLD, and hypothyroidism. She was taken to the ED after being found to be increasingly confused x3 days after experiencing a fall.  Pt discussed during ICU rounds and with RN.  Per case manager pt likely to transfer to [facility] 12/16. Pt having diarrhea, Abx changed. Plan to adjust TF. Pt transitioned to trach collar.   11/30- intubation; OGT placement 12/1- initial RD assessment (remote) 12/5- failed extubation; re-intubation 12/10 [facility] to [facility] for LTM  12/11 s/p trach placement  12/14 cortrak placed; tip gastric   Medications reviewed and include: colace, folic acid, SSI, syntrhoid, melatonin, liquid MVI, protonix, miralax, thiamine  KCl 10 mEq x 4   Labs reviewed: Na 149, K 2.6 CBG's: 125-164  Folate: 12.5 B12: 617  UOP: 1485 ml  I&O: +12 L   Diet Order:   Diet Order             Diet NPO time specified  Diet effective midnight                   EDUCATION NEEDS:   No education needs have been identified at this time  Skin:  Skin Assessment:  (maceration on bilateral buttocks)  Last BM:  250 ml + 3 unmeasured (tube leaking)  Height:   Ht Readings from Last 1 Encounters:  02/03/21 5' 2"  (1.575 m)    Weight:   Wt  Readings from Last 1 Encounters:  02/11/21 102.3 kg    Ideal Body Weight:  50 kg  BMI:  Body mass index is 41.25 kg/m.  Estimated Nutritional Needs:   Kcal:  1700-1900  Protein:  110-130 grams  Fluid:  >/= 1.8 L/day  Lockie Pares., RD, LDN, CNSC See AMiON for contact information

## 2021-02-11 NOTE — Progress Notes (Addendum)
NAME:  Rhonda Winters, MRN:  [id number], DOB:  05/31/1945, LOS: 37 ADMISSION DATE:  01/25/2021, CONSULTATION DATE:  01/27/21 REFERRING MD:  Florene Glen, CHIEF COMPLAINT:  AMS   BRIEF  75 year old woman w/ hx of metabolic syndrome presenting with increasing confusion.  Workup has revealed RUL Pneumonia and pneumococcal bacteremia.  Unfortunately became more agitated through day, given ativan but then became obtunded with increased WOB.  Initial dose flumazenil ineffective.  Transferred to ICU and PCCM consulted.     Pertinent  Medical History  Hypothyroidism HTN Prior stroke T2DM  Significant Hospital Events: Including procedures, antibiotic start and stop dates in addition to other pertinent events   11/29 admitted 11/30 decompensation, intubation.  Attempted LP. 12/1 seizure-like activity, Ceribell in progress.  AED's initiated for seizure like movement.  IR LP. CT Head with no acute intracranial abnormalities, chronic small vessel disease, chronic R PCA territory infarct 12.2 - Afebrile . RN reports no seizure activity overnight.  Propofol turned off this am, remains on 50 mcg fentanyl. D5 turned off with hyperglycemia/ Pt waking, following simple commands intermittently, purposeful movement . Blood cultures with pan sensitive strep pneumo. Vent - 40-50% FiO2, PEEP 5 Glucose range 177-185 12/3 - given lasix yesterday -> then soft BP -> needed fluids. Had SVT ->on cardizem and resolved. On 50% fio2. On fent gtt/diprivan gtt (prior intolreance to precedex) -> agitated encephalopathy +. Afebrile 12/5 SBT done on prop/fent and failed 2/2 small TV, trial Precedex again.   Failed extubation->reintubated 12/6 No overnight events, calm on Precedex/Fentanyl 12/7 Diuresed well, overnight hypotension with transient pressors resolved this AM, no improvement in encephalopathy, vesicular lesions found on sacrum yesterday, viral cultures pending 12/8 Continue to remains significantly encephalopathic. Eyes  are spontaneously open and movement seen in all extremities but she will no track and unable to follow any commands on dilaudid and propofol drip   Interim History / Subjective:  No overnight issues Patient remained afebrile after change of antibiotics with Unasyn Mental status is improving Tolerated trach collar for 6 hours before became tachypneic  Objective   Blood pressure (!) 155/78, pulse 76, temperature 99.6 F (37.6 C), temperature source Axillary, resp. rate (!) 25, height 5\' 2"  (1.575 m), weight 102.3 kg, SpO2 98 %.    Vent Mode: PRVC FiO2 (%):  [40 %] 40 % Set Rate:  [26 bmp] 26 bmp Vt Set:  [400 mL] 400 mL PEEP:  [5 cmH20] 5 cmH20 Pressure Support:  [10 cmH20] 10 cmH20 Plateau Pressure:  [13 cmH20-20 cmH20] 20 cmH20   Intake/Output Summary (Last 24 hours) at 02/11/2021 1122 Last data filed at 02/11/2021 1000 Gross per 24 hour  Intake 3084.25 ml  Output 1635 ml  Net 1449.25 ml   Filed Weights   02/09/21 0023 02/10/21 0500 02/11/21 0500  Weight: 102.3 kg 103.5 kg 102.3 kg     Physical exam: General: Crtitically ill-appearing elderly Caucasian female, s/p trach HEENT: [place]/AT, eyes anicteric.  Dry mucous membranes Neuro: Opens eyes with vocal stimuli, following simple commands, wiggling toes, showing her tongue out Chest: Reduced air entry at bases, no wheezes or crackles Heart: Regular rate and rhythm, no murmurs or gallops Abdomen: Soft, nontender, nondistended, bowel sounds present Skin: No rash  Resolved Hospital Problem list   AKI  -resolved Seizure Like Activity   -Noted on 11/30, Ceribell negative 11/30.  Repeated on 12/1 negative as well.   Assessment & Plan:  Acute Hypoxemic Respiratory Failure/ Sepsis secondary to Pneumococcal PNA  Pneumococcal PNA  with Bacteremia (POA) Probable new development of aspiration pneumonia in right upper lobe Acute Metabolic/infectious encephalopathy Patient tolerated trach collar for 6 hours yesterday This morning  she was placed back on trach collar, will keep it for as long as tolerated Sepsis indices are improving after switching antibiotics She had pneumococcal bacteremia and pneumonia that can lead to altered mental status due to infectious encephalopathy Patient mental status is much improved since admission Watch for respiratory distress Quetiapine was stopped per psych recommendations Continue IV Unasyn Respiratory culture is again growing gram-positive cocci  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 Completed 10 day course of acyclovir  Acute urinary incontinence Patient started retaining urine Multiple times in and out catheter was placed but yesterday she retained 800 cc Foley catheter was replaced Started on bethanechol Will give voiding trial and in 1 to 2 days  DM II with Hyperglycemia, improved Continue sliding scale insulin Monitor CBG with goal 140-180  Hypernatremia Continue free water flushes Serum sodium is slowly trending down  Hypokalemia Continue aggressive electrolyte supplement  Morbid obesity Continue tube feeds Dietitian is following  Anemia of critical illness H&H remained stable, closely monitor and transfuse if less than 7  Best Practice (right click and "Reselect all SmartList Selections" daily)  Diet/type: TF DVT prophylaxis: LMWH  GI prophylaxis: PPI Lines: N/A Foley: Yes still needed Code Status:  full code Last date of multidisciplinary goals of care discussion: 12/13: Patient's husband was updated at bedside, all questions answered.  Patient will probably need LTAC placement Critical care time:     Total critical care time: 33 minutes  Performed by: [place] care time was exclusive of separately billable procedures and treating other patients.   Critical care was necessary to treat or prevent imminent or life-threatening deterioration.   Critical care was time spent personally by me on the following  activities: development of treatment plan with patient and/or surrogate as well as nursing, discussions with consultants, evaluation of patient's response to treatment, examination of patient, obtaining history from patient or surrogate, ordering and performing treatments and interventions, ordering and review of laboratory studies, ordering and review of radiographic studies, pulse oximetry and re-evaluation of patient's condition.   Jacky Kindle MD [facility] See Amion for pager If no response to pager, please call [phone] until 7pm After 7pm, Please call E-link [phone]

## 2021-02-11 NOTE — Progress Notes (Signed)
Remains trached. No significant change. Management per PCCM and psychiatry. Will be available as needed. Discussed with Dr. Tacy Learn.  -- Amie Portland, MD Neurologist [facility] Pager: [phone]

## 2021-02-11 NOTE — Consult Note (Signed)
[facility] Followup Psychiatric Evaluation   Service Date: February 11, 2021 LOS:  LOS: 16 days    Assessment  Rhonda Winters is a 75 y.o. female admitted medically for 01/25/2021  8:12 PM for confusion. 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   She was seen by Dr. Real Cons on 12/11 with an equivocal BFCRS and no response to ativan challenge. Plan was to see after pt extubated; communicated with primary team that pt became more alert/participative and was following commands so deferred until today. Pt later daignosed with new pneumonia and trach placed, now tolerating TCT. She is now much more responsive and catatonia no longer on differential.  She had effectively 2 lorazepam trials with minimal response earlier in course. Had focused on reducing sedating medications (elavil, quetiapine) with some response. We will sign off today.   Diagnoses:  Active Hospital problems: Principal Problem:   Sepsis ([place]) Active Problems:   HTN (hypertension)   Diabetic neuropathy associated with type 2 diabetes mellitus ([place])   Hypothyroidism   CVA (cerebral vascular accident) ([place])   Pneumonia   Acute encephalopathy   ARF (acute renal failure) ([facility])   Streptococcal pneumonia ([place])   Acute respiratory failure with hypoxia ([place])   AMS (altered mental status)    Problems edited/added by me: No problems updated.  Plan  ## Safety and Observation Level:  - Based on my clinical evaluation, I estimate the patient to be at low risk of self harm in the current setting, stemming mostly from fall risk. - At this time, we recommend a routine level of observation. This decision is based on my review of the chart including patient's history and current presentation, interview of the  patient, mental status examination, and consideration of suicide risk including evaluating suicidal ideation, plan, intent, suicidal or self-harm behaviors, risk factors, and protective factors. This judgment is based on our ability to directly address suicide risk, implement suicide prevention strategies and develop a safety plan while the patient is in the clinical setting. Please contact our team if there is a concern that risk level has changed.   ## Medications:  -- STOP amitriptyline 25 mg QHS (anticholinergic) -- STOP 25 -- CHANGE melatonin from QHS to q1800 -- agree with holding home duloxetine  ## Medical Decision Making Capacity:  Not formally assessed  ## Further Work-up:  -- none currently  ## Disposition:  -- per primary team  ##Legal Status   Thank you for this consult request. Recommendations have been communicated to the primary team.  We will sign off at this time.   Finley A Amari Burnsworth    Followup history  Relevant Aspects of Hospital Course:  Admitted on 01/25/2021 for AMS; decompensated and admitted on 11/30. Was intubated for about a week with significant encephalopathy; psychiatry consulted while still intubated. Notably pt with vesicular lesions on sacrum and receiving empiric tx for herpes encephalopathy.  Patient Report:  Pt seen during TCT. She is alert and tracks with eyes; responds appropriately to voice. She smiled when her nails were complimented.  Not able to perform CAM-ICU (did not squeeze hands, did not reliably nod/shake head in response to yes/no ?s)  ROS:  Level 2 caveat  Collateral information:  Per bedside nurse -following commands x4 limbs intermittently, gradually better over time. Possibly volitional component.  Psychiatric History:  Chart, nurse   Social History:  Deferred  Family History:  Deferred The patient's family history is not on file.  Medical History: Past Medical History:  Diagnosis Date   Acquired  hypothyroidism    Hypertension    Stroke ([place])    Type 2 diabetes mellitus ([place])     Surgical History: Past Surgical History:  Procedure Laterality Date   LOOP RECORDER INSERTION N/A 07/01/2020   Procedure: LOOP RECORDER INSERTION;  Surgeon: Constance Haw, MD;  Location: [facility];  Service: Cardiovascular;  Laterality: N/A;    Medications:   Current Facility-Administered Medications:    0.9 %  sodium chloride infusion, 250 mL, Intravenous, Continuous, Anders Simmonds, MD, Last Rate: 10 mL/hr at 02/10/21 0442, 250 mL at 02/10/21 0442   acetaminophen (TYLENOL) tablet 650 mg, 650 mg, Per Tube, Q6H PRN, 650 mg at 02/08/21 1322 **OR** acetaminophen (TYLENOL) suppository 650 mg, 650 mg, Rectal, Q6H PRN, Jacky Kindle, MD   acyclovir (ZOVIRAX) 200 MG/5ML suspension SUSP 800 mg, 800 mg, Per Tube, 5 X Daily, Chand, Sudham, MD, 800 mg at 02/11/21 1315   albuterol (PROVENTIL) (2.5 MG/3ML) 0.083% nebulizer solution 2.5 mg, 2.5 mg, Nebulization, Q4H PRN, McQuaid, Douglas B, MD   Ampicillin-Sulbactam (UNASYN) 3 g in sodium chloride 0.9 % 100 mL IVPB, 3 g, Intravenous, Q6H, Bertis Ruddy, RPH, Paused at 02/11/21 0959   bethanechol (URECHOLINE) tablet 10 mg, 10 mg, Per Tube, TID, Gerald Leitz D, NP, 10 mg at 02/11/21 2620   chlorhexidine gluconate (MEDLINE KIT) (PERIDEX) 0.12 % solution 15 mL, 15 mL, Mouth Rinse, BID, Candee Furbish, MD, 15 mL at 02/11/21 0815   Chlorhexidine Gluconate Cloth 2 % PADS 6 each, 6 each, Topical, Daily, Candee Furbish, MD, 6 each at 02/10/21 2245   cloNIDine (CATAPRES - Dosed in mg/24 hr) patch 0.3 mg, 0.3 mg, Transdermal, Weekly, Tacy Learn, Currie Paris, MD, 0.3 mg at 02/09/21 1100   docusate (COLACE) 50 MG/5ML liquid 100 mg, 100 mg, Per Tube, BID, Candee Furbish, MD, 100 mg at 02/08/21 2048   enoxaparin (LOVENOX) injection 40 mg, 40 mg, Subcutaneous, Q24H, Candee Furbish, MD, 40 mg at 02/11/21 3559   feeding supplement (JEVITY 1.2 CAL) liquid 1,000 mL, 1,000  mL, Per Tube, Continuous, Chand, Currie Paris, MD   feeding supplement (PROSource TF) liquid 90 mL, 90 mL, Per Tube, BID, Jacky Kindle, MD   folic acid (FOLVITE) tablet 1 mg, 1 mg, Per Tube, Daily, Green, Terri L, RPH, 1 mg at 02/11/21 0917   free water 200 mL, 200 mL, Per Tube, Q4H, Chand, Sudham, MD, 200 mL at 02/11/21 1130   gabapentin (NEURONTIN) 250 MG/5ML solution 100 mg, 100 mg, Per Tube, TID, Gleason, Otilio Carpen, PA-C, 100 mg at 02/11/21 7416   Gerhardt's butt cream, , Topical, BID, Jacky Kindle, MD, Given at 02/11/21 0918   hydrochlorothiazide (HYDRODIURIL) tablet 12.5 mg, 12.5 mg, Per Tube, Daily, Chand, Sudham, MD, 12.5 mg at 02/11/21 0918   insulin aspart (novoLOG) injection 0-9 Units, 0-9 Units, Subcutaneous, Q4H, Rise Patience, MD, 2 Units at 02/11/21 1129   labetalol (NORMODYNE) injection 10-20 mg, 10-20 mg, Intravenous, Q2H PRN, Ogan, Okoronkwo U, MD, 20 mg at 02/10/21 2119   levothyroxine (SYNTHROID) tablet 125 mcg, 125 mcg, Per Tube, Q0600, Minda Ditto, RPH, 125 mcg at 02/11/21 3845   MEDLINE mouth rinse, 15 mL, Mouth Rinse, 10 times per day, Candee Furbish, MD, 15 mL at 02/11/21 1328   melatonin tablet  3 mg, 3 mg, Per Tube, QHS, Jacky Kindle, MD, 3 mg at 02/10/21 2205   metoprolol tartrate (LOPRESSOR) tablet 50 mg, 50 mg, Per Tube, Q8H, Jacky Kindle, MD, 50 mg at 02/11/21 1315   midazolam (VERSED) injection 2 mg, 2 mg, Intravenous, Q6H PRN, Gleason, Otilio Carpen, PA-C, 2 mg at 02/10/21 1217   multivitamin with minerals tablet 1 tablet, 1 tablet, Per Tube, Daily, Jacky Kindle, MD, 1 tablet at 02/11/21 1315   pantoprazole (PROTONIX) injection 40 mg, 40 mg, Intravenous, QHS, Candee Furbish, MD, 40 mg at 02/10/21 2159   polyethylene glycol (MIRALAX / GLYCOLAX) packet 17 g, 17 g, Per Tube, Daily, Candee Furbish, MD, 17 g at 02/08/21 0847   potassium chloride 10 mEq in 100 mL IVPB, 10 mEq, Intravenous, Q1 Hr x 4, Chand, Sudham, MD, Last Rate: 100 mL/hr at 02/11/21 1328, 10 mEq at  02/11/21 1328   sodium chloride flush (NS) 0.9 % injection 10-40 mL, 10-40 mL, Intracatheter, Q12H, McQuaid, Nathaneil Canary B, MD, 30 mL at 02/11/21 0919   sodium chloride flush (NS) 0.9 % injection 10-40 mL, 10-40 mL, Intracatheter, PRN, Juanito Doom, MD   [COMPLETED] thiamine 538m in normal saline (545m IVPB, 500 mg, Intravenous, Q8H, Stopped at 02/05/21 0550 **FOLLOWED BY** [COMPLETED] thiamine (B-1) 250 mg in sodium chloride 0.9 % 50 mL IVPB, 250 mg, Intravenous, Daily, Stopped at 02/11/21 1035 **FOLLOWED BY** [START ON 02/12/2021] thiamine (B-1) injection 100 mg, 100 mg, Intravenous, Daily, KhDonnetta SimpersMD  Allergies: Allergies  Allergen Reactions   Hydrocodone-Acetaminophen Nausea And Vomiting       Objective  Vital signs:  Temp:  [97.9 F (36.6 C)-100.1 F (37.8 C)] 99.2 F (37.3 C) (12/15 1200) Pulse Rate:  [62-148] 72 (12/15 1300) Resp:  [24-31] 26 (12/15 1124) BP: (114-166)/(61-97) 161/82 (12/15 1300) SpO2:  [95 %-100 %] 95 % (12/15 1300) FiO2 (%):  [40 %] 40 % (12/15 1124) Weight:  [102.3 kg] 102.3 kg (12/15 0500)  Physical Exam: Gen: Lying in ICU bed, responsive to voice Pulm: trach Neuro: no spontaneous movement in limbs, makes variety of facial expressions, face symmetric Psych: Alert, not oriented  Psychiatric Specialty Exam:  Presentation  Mental Status Exam: Appearance:  lying in bed, ill-appearing, and has recent expensive manicure.   Attitude:   sedated and minimally interactive  Behavior/Psychomotor:  Minimal spontaneous movement  Speech/Language:   no audible speech as pt is on TCT with no PSMV  Mood:  Unable to evaluate due to TCT  Affect:  Unable to evaluate due to TCT  Thought process:  Unable to evaluate due to TCT  Thought content:    Unable to evaluate due to TCT  Perceptual disturbances:   Unable to evaluate due to TCT  Attention:  Unable to evaluate due to TCT  Concentration:  Unable to evaluate due to TCT  Orientation:  Unable to  evaluate due to TCT  Memory:  Unable to evaluate due to TCThe Pepsif knowledge:   Unable to evaluate due to TCT  Insight:    Unable to evaluate due to TCT  Judgment:   Unable to evaluate due to TCT  Impulse Control:  Unable to evaluate due to TCT   Blood pressure (!) 161/82, pulse 72, temperature 99.2 F (37.3 C), temperature source Axillary, resp. rate (!) 26, height _0  (1.575 m), weight 102.3 kg, SpO2 95 %. Body mass index is 41.25 kg/m.

## 2021-02-12 ENCOUNTER — Other Ambulatory Visit (HOSPITAL_COMMUNITY): Payer: Self-pay

## 2021-02-12 ENCOUNTER — Inpatient Hospital Stay
Admission: RE | Admit: 2021-02-12 | Discharge: 2021-03-05 | Disposition: A | Discharge status: Other Acute Inpatient Hospital | Payer: Medicare Other | Source: Other Acute Inpatient Hospital | Attending: Internal Medicine | Admitting: Internal Medicine

## 2021-02-12 DIAGNOSIS — J9621 Acute and chronic respiratory failure with hypoxia: Secondary | ICD-10-CM

## 2021-02-12 DIAGNOSIS — I63531 Cerebral infarction due to unspecified occlusion or stenosis of right posterior cerebral artery: Secondary | ICD-10-CM | POA: Diagnosis present

## 2021-02-12 DIAGNOSIS — J9690 Respiratory failure, unspecified, unspecified whether with hypoxia or hypercapnia: Secondary | ICD-10-CM

## 2021-02-12 DIAGNOSIS — T148XXA Other injury of unspecified body region, initial encounter: Secondary | ICD-10-CM

## 2021-02-12 DIAGNOSIS — R4182 Altered mental status, unspecified: Secondary | ICD-10-CM | POA: Diagnosis present

## 2021-02-12 DIAGNOSIS — A419 Sepsis, unspecified organism: Secondary | ICD-10-CM | POA: Diagnosis present

## 2021-02-12 DIAGNOSIS — J154 Pneumonia due to other streptococci: Secondary | ICD-10-CM | POA: Diagnosis present

## 2021-02-12 LAB — GLUCOSE, CAPILLARY
Glucose-Capillary: 151 mg/dL — ABNORMAL HIGH (ref 70–99)
Glucose-Capillary: 137 mg/dL — ABNORMAL HIGH (ref 70–99)
Glucose-Capillary: 191 mg/dL — ABNORMAL HIGH (ref 70–99)
Glucose-Capillary: 145 mg/dL — ABNORMAL HIGH (ref 70–99)

## 2021-02-12 LAB — BASIC METABOLIC PANEL
Anion gap: 5 (ref 5–15)
BUN: 21 mg/dL (ref 8–23)
CO2: 27 mmol/L (ref 22–32)
Calcium: 9.1 mg/dL (ref 8.9–10.3)
Chloride: 114 mmol/L — ABNORMAL HIGH (ref 98–111)
Creatinine, Ser: 0.6 mg/dL (ref 0.44–1.00)
GFR, Estimated: >60 mL/min (ref >60)
Glucose, Bld: 151 mg/dL — ABNORMAL HIGH (ref 70–99)
Potassium: 3.6 mmol/L (ref 3.5–5.1)
Sodium: 146 mmol/L — ABNORMAL HIGH (ref 135–145)

## 2021-02-12 LAB — COMPREHENSIVE METABOLIC PANEL
ALT: 52 U/L — ABNORMAL HIGH (ref 0–44)
AST: 24 U/L (ref 15–41)
Albumin: 2 g/dL — ABNORMAL LOW (ref 3.5–5.0)
Alkaline Phosphatase: 73 U/L (ref 38–126)
Anion gap: 8 (ref 5–15)
BUN: 18 mg/dL (ref 8–23)
CO2: 25 mmol/L (ref 22–32)
Calcium: 9.6 mg/dL (ref 8.9–10.3)
Chloride: 108 mmol/L (ref 98–111)
Creatinine, Ser: 0.54 mg/dL (ref 0.44–1.00)
GFR, Estimated: >60 mL/min (ref >60)
Glucose, Bld: 100 mg/dL — ABNORMAL HIGH (ref 70–99)
Potassium: 3.7 mmol/L (ref 3.5–5.1)
Sodium: 141 mmol/L (ref 135–145)
Total Bilirubin: 0.5 mg/dL (ref 0.3–1.2)
Total Protein: 6 g/dL — ABNORMAL LOW (ref 6.5–8.1)

## 2021-02-12 LAB — BLOOD GAS, ARTERIAL
Acid-Base Excess: 3.6 mmol/L — ABNORMAL HIGH (ref 0.0–2.0)
Bicarbonate: 27.4 mmol/L (ref 20.0–28.0)
Drawn by: 164
FIO2: 40
O2 Saturation: 98.7 %
Patient temperature: 37
pCO2 arterial: 39.9 mmHg (ref 32.0–48.0)
pH, Arterial: 7.451 — ABNORMAL HIGH (ref 7.350–7.450)
pO2, Arterial: 115 mmHg — ABNORMAL HIGH (ref 83.0–108.0)

## 2021-02-12 LAB — MAGNESIUM: Magnesium: 1.8 mg/dL (ref 1.7–2.4)

## 2021-02-12 MED ORDER — INSULIN ASPART 100 UNIT/ML IJ SOLN: 0.0000 [IU] | INTRAMUSCULAR | 0 refills | Status: DC

## 2021-02-12 MED ORDER — MAGNESIUM SULFATE 2 GM/50ML IV SOLN
2.0000 g | Freq: Once | INTRAVENOUS | Status: AC
  Administered 2021-02-12: 2 g via INTRAVENOUS
  Filled 2021-02-12: qty 50

## 2021-02-12 MED ORDER — CHLORHEXIDINE GLUCONATE 0.12 % MT SOLN: 15.0000 mL | Freq: Two times a day (BID) | OROMUCOSAL | Status: DC

## 2021-02-12 MED ORDER — GABAPENTIN 250 MG/5ML PO SOLN: 100.0000 mg | Freq: Three times a day (TID) | ORAL | 0 refills | Status: DC

## 2021-02-12 MED ORDER — THIAMINE HCL 100 MG/ML IJ SOLN: 100.0000 mg | Freq: Every day | INTRAMUSCULAR | 0 refills | Status: DC

## 2021-02-12 MED ORDER — BETHANECHOL CHLORIDE 10 MG PO TABS: 10.0000 mg | ORAL_TABLET | Freq: Three times a day (TID) | ORAL | 0 refills | Status: AC

## 2021-02-12 MED ORDER — CLONIDINE 0.3 MG/24HR TD PTWK: 0.3000 mg | MEDICATED_PATCH | TRANSDERMAL | 0 refills | Status: DC

## 2021-02-12 MED ORDER — ORAL CARE MOUTH RINSE
15.0000 mL | Freq: Two times a day (BID) | OROMUCOSAL | Status: DC
  Administered 2021-02-12: 15 mL via OROMUCOSAL

## 2021-02-12 MED ORDER — HYDROCHLOROTHIAZIDE 12.5 MG PO TABS: 12.5000 mg | ORAL_TABLET | Freq: Every day | ORAL | 0 refills | Status: DC

## 2021-02-12 MED ORDER — METOPROLOL TARTRATE 50 MG PO TABS: 50.0000 mg | ORAL_TABLET | Freq: Three times a day (TID) | ORAL | 0 refills | Status: DC

## 2021-02-12 MED ORDER — SODIUM CHLORIDE 0.9 % IV SOLN: 3.0000 g | Freq: Four times a day (QID) | INTRAVENOUS | 0 refills | Status: DC

## 2021-02-12 NOTE — Progress Notes (Signed)
NAME:  Rhonda Winters, MRN:  [id number], DOB:  April 05, 1945, LOS: 42 ADMISSION DATE:  01/25/2021, CONSULTATION DATE:  01/27/21 REFERRING MD:  Florene Glen, CHIEF COMPLAINT:  AMS   BRIEF  75 year old woman w/ hx of metabolic syndrome presenting with increasing confusion.  Workup has revealed RUL Pneumonia and pneumococcal bacteremia.  Unfortunately became more agitated through day, given ativan but then became obtunded with increased WOB.  Initial dose flumazenil ineffective.  Transferred to ICU and PCCM consulted.     Pertinent  Medical History  Hypothyroidism HTN Prior stroke T2DM  Significant Hospital Events: Including procedures, antibiotic start and stop dates in addition to other pertinent events   11/29 admitted 11/30 decompensation, intubation.  Attempted LP. 12/1 seizure-like activity, Ceribell in progress.  AED's initiated for seizure like movement.  IR LP. CT Head with no acute intracranial abnormalities, chronic small vessel disease, chronic R PCA territory infarct 12.2 - Afebrile . RN reports no seizure activity overnight.  Propofol turned off this am, remains on 50 mcg fentanyl. D5 turned off with hyperglycemia/ Pt waking, following simple commands intermittently, purposeful movement . Blood cultures with pan sensitive strep pneumo. Vent - 40-50% FiO2, PEEP 5 Glucose range 177-185 12/3 - given lasix yesterday -> then soft BP -> needed fluids. Had SVT ->on cardizem and resolved. On 50% fio2. On fent gtt/diprivan gtt (prior intolreance to precedex) -> agitated encephalopathy +. Afebrile 12/5 SBT done on prop/fent and failed 2/2 small TV, trial Precedex again.   Failed extubation->reintubated 12/6 No overnight events, calm on Precedex/Fentanyl 12/7 Diuresed well, overnight hypotension with transient pressors resolved this AM, no improvement in encephalopathy, vesicular lesions found on sacrum yesterday, viral cultures pending 12/8 Continue to remains significantly encephalopathic. Eyes  are spontaneously open and movement seen in all extremities but she will no track and unable to follow any commands on dilaudid and propofol drip  12/11 trach 12/13 started unasyn  12/15 trach collar 12/16 pending discharge to [facility]   Interim History / Subjective:   Increasing secretions overnight Continues on unasyn  T 101  Objective   Blood pressure (!) 149/79, pulse 65, temperature 99 F (37.2 C), temperature source Axillary, resp. rate (!) 26, height 5\' 2"  (1.575 m), weight 106.3 kg, SpO2 95 %.    FiO2 (%):  [40 %] 40 %   Intake/Output Summary (Last 24 hours) at 02/12/2021 0841 Last data filed at 02/12/2021 0700 Gross per 24 hour  Intake 3144.29 ml  Output 2375 ml  Net 769.29 ml   Filed Weights   02/10/21 0500 02/11/21 0500 02/12/21 0407  Weight: 103.5 kg 102.3 kg 106.3 kg     Physical exam: General: Chronically and acutely ill appearing older adult F. Reclined in bed NAD  HEENT: NCAT. Pink mm. Moderate tan tracheal secretions. Trach secure.  Neuro: Awake alert. Following simple commands. Globally weak.  Chest: Rhonchi bilaterally. Symmetrical chest expansion, even unlabored on trach collar  Heart: rr s1s2 cap refill < 3 sec.  Abdomen: soft ndnt + bowel sounds  Skin: pale c/d  Resolved Hospital Problem list   AKI  -resolved Seizure Like Activity   -Noted on 11/30, Ceribell negative 11/30.  Repeated on 12/1 negative as well.  Assessment & Plan:   Acute metabolic encephalopathy  -sepsis, likely polypharmacy  P  -delirium precautions -abx as below   Acute hypoxemic respiratory failure Aspiration PNA  Sepsis due to pneumoccocal PNA, Pneumococcal bacteremia (improved)  P -cont trach collar -cont unasyn  -routine trach care  -pulm  hygiene   HTN -HCTZ 12.5 mg qD -metop 50mg  TID -clonidine patch 0.3mg    HSV lesions -acyclovir   Urinary incontinence -external catheter   DM2  -SSI  Hypothyroidism P -synthroid    Hypernatremia Hypokalemia P -Fwf -replace lytes as needed   Morbid obesity -EN per RDN   Best Practice (right click and "Reselect all SmartList Selections" daily)  Diet/type: TF DVT prophylaxis: LMWH  GI prophylaxis: PPI Lines: N/A Foley:  N/A Code Status:  full code Last date of multidisciplinary goals of care discussion: 12/13: Patient's husband was updated at bedside, all questions answered    CCT; n/a  Eliseo Gum MSN, AGACNP-BC [place] for pager  02/12/2021, 8:41 AM

## 2021-02-12 NOTE — Progress Notes (Signed)
Mg 1.8 Replaced per protocol  

## 2021-02-13 LAB — BASIC METABOLIC PANEL
Anion gap: 8 (ref 5–15)
BUN: 15 mg/dL (ref 8–23)
CO2: 27 mmol/L (ref 22–32)
Calcium: 9.5 mg/dL (ref 8.9–10.3)
Chloride: 106 mmol/L (ref 98–111)
Creatinine, Ser: 0.55 mg/dL (ref 0.44–1.00)
GFR, Estimated: >60 mL/min (ref >60)
Glucose, Bld: 176 mg/dL — ABNORMAL HIGH (ref 70–99)
Potassium: 3.7 mmol/L (ref 3.5–5.1)
Sodium: 141 mmol/L (ref 135–145)

## 2021-02-13 LAB — HEMOGLOBIN A1C
Hgb A1c MFr Bld: 6.7 % — ABNORMAL HIGH (ref 4.8–5.6)
Mean Plasma Glucose: 145.59 mg/dL

## 2021-02-13 LAB — CBC WITH DIFFERENTIAL/PLATELET
Abs Immature Granulocytes: 0.05 10*3/uL (ref 0.00–0.07)
Basophils Absolute: 0 10*3/uL (ref 0.0–0.1)
Basophils Relative: 0 %
Eosinophils Absolute: 0.4 10*3/uL (ref 0.0–0.5)
Eosinophils Relative: 4 %
HCT: 27.9 % — ABNORMAL LOW (ref 36.0–46.0)
Hemoglobin: 8.6 g/dL — ABNORMAL LOW (ref 12.0–15.0)
Immature Granulocytes: 1 %
Lymphocytes Relative: 14 %
Lymphs Abs: 1.3 10*3/uL (ref 0.7–4.0)
MCH: 31.7 pg (ref 26.0–34.0)
MCHC: 30.8 g/dL (ref 30.0–36.0)
MCV: 103 fL — ABNORMAL HIGH (ref 80.0–100.0)
Monocytes Absolute: 0.7 10*3/uL (ref 0.1–1.0)
Monocytes Relative: 8 %
Neutro Abs: 6.8 10*3/uL (ref 1.7–7.7)
Neutrophils Relative %: 73 %
Platelets: 497 10*3/uL — ABNORMAL HIGH (ref 150–400)
RBC: 2.71 MIL/uL — ABNORMAL LOW (ref 3.87–5.11)
RDW: 14.2 % (ref 11.5–15.5)
WBC: 9.3 10*3/uL (ref 4.0–10.5)
nRBC: 0 % (ref 0.0–0.2)

## 2021-02-14 ENCOUNTER — Other Ambulatory Visit (HOSPITAL_COMMUNITY): Payer: Self-pay

## 2021-02-14 DIAGNOSIS — R4182 Altered mental status, unspecified: Secondary | ICD-10-CM

## 2021-02-14 DIAGNOSIS — J9621 Acute and chronic respiratory failure with hypoxia: Secondary | ICD-10-CM

## 2021-02-14 DIAGNOSIS — I63531 Cerebral infarction due to unspecified occlusion or stenosis of right posterior cerebral artery: Secondary | ICD-10-CM

## 2021-02-14 DIAGNOSIS — J154 Pneumonia due to other streptococci: Secondary | ICD-10-CM

## 2021-02-14 DIAGNOSIS — A419 Sepsis, unspecified organism: Secondary | ICD-10-CM

## 2021-02-14 LAB — COMPREHENSIVE METABOLIC PANEL
ALT: 39 U/L (ref 0–44)
AST: 22 U/L (ref 15–41)
Albumin: 1.8 g/dL — ABNORMAL LOW (ref 3.5–5.0)
Alkaline Phosphatase: 72 U/L (ref 38–126)
Anion gap: 9 (ref 5–15)
BUN: 14 mg/dL (ref 8–23)
CO2: 31 mmol/L (ref 22–32)
Calcium: 9.3 mg/dL (ref 8.9–10.3)
Chloride: 101 mmol/L (ref 98–111)
Creatinine, Ser: 0.63 mg/dL (ref 0.44–1.00)
GFR, Estimated: >60 mL/min (ref >60)
Glucose, Bld: 179 mg/dL — ABNORMAL HIGH (ref 70–99)
Potassium: 3.1 mmol/L — ABNORMAL LOW (ref 3.5–5.1)
Sodium: 141 mmol/L (ref 135–145)
Total Bilirubin: 0.5 mg/dL (ref 0.3–1.2)
Total Protein: 5.8 g/dL — ABNORMAL LOW (ref 6.5–8.1)

## 2021-02-14 MED ORDER — IOHEXOL 350 MG/ML SOLN
44.0000 mL | Freq: Once | INTRAVENOUS | Status: AC | PRN
  Administered 2021-02-14: 16:00:00 44 mL via INTRAVENOUS

## 2021-02-14 NOTE — Consult Note (Signed)
[facility]  PULMONARY SERVICE  Date of Service: 02/14/2021  PULMONARY CRITICAL CARE CONSULT   Rhonda Winters  MVH:[id number]  DOB: [birth year]   DOA: 02/12/2021  Referring Physician: Satira Sark, MD  HPI: Rhonda Winters is a 75 y.o. female seen for follow up of Acute on Chronic Respiratory Failure.  Patient has multiple medical problems including history of hypertension stroke type 2 diabetes hypothyroidism who presented to the hospital after sustaining a fall 3 days prior to his admission.  Patient apparently had suffering some confusion and had a work-up done in the emergency department and was found to have a pneumonia in the right upper lobe.  CT scan was also done at that time of the head which was unremarkable.  It was felt at that time the patient was septic because of pneumonia eventually turned out to be streptococcal.  The patient admitted to the floor decompensated eventually ended up having to be placed on mechanical ventilation and by the 15th patient was on T collar trials.  Patient currently is on 35% and has a cuffed trach in place apparently not able to tolerate cuff deflation because of excessive secretions.  Patient also should be noted had failed extubation trials and that is why the patient ended up having to have a tracheostomy.  Review of Systems:  ROS performed and is unremarkable other than noted above.  Past Medical History:  Diagnosis Date   Acquired hypothyroidism    Hypertension    Stroke ([place])    Type 2 diabetes mellitus ([place])     Past Surgical History:  Procedure Laterality Date   LOOP RECORDER INSERTION N/A 07/01/2020   Procedure: LOOP RECORDER INSERTION;  Surgeon: Constance Haw, MD;  Location: [facility];  Service: Cardiovascular;  Laterality: N/A;    Social History:    reports that she quit smoking about 32 years ago. Her smoking use included cigarettes. She has never used  smokeless tobacco. She reports current alcohol use. She reports that she does not use drugs.  Family History: Non-Contributory to the present illness  Allergies  Allergen Reactions   Hydrocodone-Acetaminophen Nausea And Vomiting    Medications: Reviewed on Rounds  Physical Exam:  Vitals: Temperature is 98.2 pulse 82 respiratory is 24 blood pressure is 168/85 saturations 98%  Ventilator Settings off the ventilator on T collar currently has been on 35% FiO2 with a #6 XLT trach in place  General: Comfortable at this time Eyes: Grossly normal lids, irises & conjunctiva ENT: grossly tongue is normal Neck: no obvious mass Cardiovascular: S1-S2 normal no gallop or rub Respiratory: Coarse rhonchi are noted bilaterally. Abdomen: Soft and nontender Skin: no rash seen on limited exam Musculoskeletal: not rigid Psychiatric:unable to assess Neurologic: no seizure no involuntary movements         Labs on Admission:  Basic Metabolic Panel: Recent Labs  Lab 02/08/21 0318 02/09/21 0235 02/10/21 0523 02/11/21 0541 02/12/21 0548 02/12/21 1749 02/13/21 0318 02/14/21 0606  NA 146* 146* 150* 149* 146* 141 141 141  K 4.1 3.5 3.1* 2.6* 3.6 3.7 3.7 3.1*  CL 115* 115* 115* 114* 114* 108 106 101  CO2 26 24 28 27 27 25 27 31   GLUCOSE 177* 322* 147* 170* 151* 100* 176* 179*  BUN 44* 36* 25* 25* 21 18 15 14   CREATININE 0.60 0.57 0.65 0.81 0.60 0.54 0.55 0.63  CALCIUM 9.4 9.7 9.7 9.1 9.1 9.6 9.5 9.3  MG 2.1 1.7 1.7  1.7 1.8  --   --   --   PHOS 2.5 2.9 3.2 2.8  --   --   --   --     Recent Labs  Lab 02/12/21 1655  PHART 7.451*  PCO2ART 39.9  PO2ART 115*  HCO3 27.4  O2SAT 98.7    Liver Function Tests: Recent Labs  Lab 02/12/21 1749 02/14/21 0606  AST 24 22  ALT 52* 39  ALKPHOS 73 72  BILITOT 0.5 0.5  PROT 6.0* 5.8*  ALBUMIN 2.0* 1.8*   No results for input(s): LIPASE, AMYLASE in the last 168 hours. No results for input(s): AMMONIA in the last 168 hours.  CBC: Recent  Labs  Lab 02/08/21 0318 02/09/21 0235 02/10/21 0523 02/11/21 0541 02/13/21 0318  WBC 9.4 16.1* 9.2 10.4 9.3  NEUTROABS  --   --   --   --  6.8  HGB 7.0* 8.3* 7.3* 8.0* 8.6*  HCT 23.6* 27.0* 24.3* 25.9* 27.9*  MCV 109.3* 108.0* 106.6* 105.3* 103.0*  PLT 337 422* 383 489* 497*    Cardiac Enzymes: No results for input(s): CKTOTAL, CKMB, CKMBINDEX, TROPONINI in the last 168 hours.  BNP (last 3 results) Recent Labs    01/30/21 0024  BNP 177.2*    ProBNP (last 3 results) No results for input(s): PROBNP in the last 8760 hours.   Radiological Exams on Admission: DG CHEST PORT 1 VIEW  Result Date: 02/12/2021 CLINICAL DATA:  Respiratory failure EXAM: PORTABLE CHEST 1 VIEW COMPARISON:  02/09/2021 FINDINGS: Tracheostomy tube and feeding catheter are noted in satisfactory position. Right subclavian central line is noted at the cavoatrial junction. Cardiac shadow remains enlarged in size. Increasing consolidation in the right upper lobe is noted. Central vascular congestion is again seen. IMPRESSION: Increasing consolidation in the right upper lobe when compare with the previous exam. Electronically Signed   By: Inez Catalina M.D.   On: 02/12/2021 18:16   DG Abd Portable 1V  Result Date: 02/10/2021 CLINICAL DATA:  Feeding tube placement. EXAM: PORTABLE ABDOMEN - 1 VIEW COMPARISON:  None. FINDINGS: Feeding tube coursing below the diaphragm with distal tip projecting over the pylorus. The bowel gas pattern is normal. No radio-opaque calculi or other significant radiographic abnormality are seen. IMPRESSION: Feeding tube with distal tip projecting over the gastric pylorus. Nonobstructive bowel gas pattern. Electronically Signed   By: Keane Police D.O.   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  Plan is going to be to continue with aggressive pulmonary toileting and will continue with supportive care.  Hopefully will be able to change over to a cuffless trach once his secretions improve Pneumonia due to Streptococcus patient has been treated with antibiotics.  Last chest x-ray was showing some increasing consolidation in the upper lobe we will consider doing a follow-up CT scan to further assess.  Patient has been on Unasyn Severe sepsis this has resolved hemodynamics have been stable we will continue to monitor along closely. Encephalopathy and mental status changes patient had sustained a fall  could be contributing factor also is the infection that the patient had at the time of presentation. Acute stroke supportive care we will continue to follow along  I have personally seen and evaluated the patient, evaluated laboratory and imaging results, formulated the assessment and plan and placed orders. The Patient requires high complexity decision making with multiple systems involvement.  Case was discussed on Rounds with the Respiratory Therapy Director and the Respiratory staff Time Spent 55minutes  Mickie Kozikowski A Zareah Hunzeker, MD [facility] Pulmonary Critical Care Medicine Sleep Medicine

## 2021-02-15 ENCOUNTER — Other Ambulatory Visit (HOSPITAL_COMMUNITY): Payer: Self-pay

## 2021-02-15 LAB — COMPREHENSIVE METABOLIC PANEL
ALT: 33 U/L (ref 0–44)
AST: 19 U/L (ref 15–41)
Albumin: 1.8 g/dL — ABNORMAL LOW (ref 3.5–5.0)
Alkaline Phosphatase: 74 U/L (ref 38–126)
Anion gap: 9 (ref 5–15)
BUN: 16 mg/dL (ref 8–23)
CO2: 29 mmol/L (ref 22–32)
Calcium: 9.2 mg/dL (ref 8.9–10.3)
Chloride: 100 mmol/L (ref 98–111)
Creatinine, Ser: 0.58 mg/dL (ref 0.44–1.00)
GFR, Estimated: >60 mL/min (ref >60)
Glucose, Bld: 192 mg/dL — ABNORMAL HIGH (ref 70–99)
Potassium: 3.1 mmol/L — ABNORMAL LOW (ref 3.5–5.1)
Sodium: 138 mmol/L (ref 135–145)
Total Bilirubin: 0.4 mg/dL (ref 0.3–1.2)
Total Protein: 6 g/dL — ABNORMAL LOW (ref 6.5–8.1)

## 2021-02-15 LAB — TSH: TSH: 3.09 u[IU]/mL (ref 0.350–4.500)

## 2021-02-15 LAB — CBC
HCT: 26.8 % — ABNORMAL LOW (ref 36.0–46.0)
Hemoglobin: 8.1 g/dL — ABNORMAL LOW (ref 12.0–15.0)
MCH: 30.6 pg (ref 26.0–34.0)
MCHC: 30.2 g/dL (ref 30.0–36.0)
MCV: 101.1 fL — ABNORMAL HIGH (ref 80.0–100.0)
Platelets: 491 10*3/uL — ABNORMAL HIGH (ref 150–400)
RBC: 2.65 MIL/uL — ABNORMAL LOW (ref 3.87–5.11)
RDW: 14.2 % (ref 11.5–15.5)
WBC: 12.4 10*3/uL — ABNORMAL HIGH (ref 4.0–10.5)
nRBC: 0 % (ref 0.0–0.2)

## 2021-02-15 NOTE — Progress Notes (Signed)
Pulmonary [facility]   PULMONARY CRITICAL CARE SERVICE  PROGRESS NOTE     Rhonda Winters  NOT:[id number]  DOB: [birth year]   DOA: 02/12/2021  Referring Physician: Satira Sark, MD  HPI: Rhonda Winters is a 75 y.o. female being followed for ventilator/airway/oxygen weaning Acute on Chronic Respiratory Failure.  Patient is afebrile resting comfortably without distress at this time is on T collar has been on 30% FiO2 has a #6 trach in place requiring changing on day 10  Medications: Reviewed on Rounds  Physical Exam:  Vitals: Temperature is 98.4 pulse 74 respiratory is 32 blood pressure is 173/77 saturations 97%  Ventilator Settings on T collar FiO2 is 30%  General: Comfortable at this time Neck: supple Cardiovascular: no malignant arrhythmias Respiratory: No rhonchi very coarse breath sounds Skin: no rash seen on limited exam Musculoskeletal: No gross abnormality Psychiatric:unable to assess Neurologic:no involuntary movements         Lab Data:   Basic Metabolic Panel: Recent Labs  Lab 02/09/21 0235 02/10/21 0523 02/11/21 0541 02/12/21 0548 02/12/21 1749 02/13/21 0318 02/14/21 0606 02/15/21 0625  NA 146* 150* 149* 146* 141 141 141 138  K 3.5 3.1* 2.6* 3.6 3.7 3.7 3.1* 3.1*  CL 115* 115* 114* 114* 108 106 101 100  CO2 24 28 27 27 25 27 31 29   GLUCOSE 322* 147* 170* 151* 100* 176* 179* 192*  BUN 36* 25* 25* 21 18 15 14 16   CREATININE 0.57 0.65 0.81 0.60 0.54 0.55 0.63 0.58  CALCIUM 9.7 9.7 9.1 9.1 9.6 9.5 9.3 9.2  MG 1.7 1.7 1.7 1.8  --   --   --   --   PHOS 2.9 3.2 2.8  --   --   --   --   --     ABG: Recent Labs  Lab 02/12/21 1655  PHART 7.451*  PCO2ART 39.9  PO2ART 115*  HCO3 27.4  O2SAT 98.7    Liver Function Tests: Recent Labs  Lab 02/12/21 1749 02/14/21 0606 02/15/21 0625  AST 24 22 19   ALT 52* 39 33  ALKPHOS 73 72 74  BILITOT 0.5 0.5 0.4  PROT 6.0* 5.8* 6.0*  ALBUMIN 2.0* 1.8* 1.8*    No results for input(s): LIPASE, AMYLASE in the last 168 hours. No results for input(s): AMMONIA in the last 168 hours.  CBC: Recent Labs  Lab 02/09/21 0235 02/10/21 0523 02/11/21 0541 02/13/21 0318 02/15/21 0625  WBC 16.1* 9.2 10.4 9.3 12.4*  NEUTROABS  --   --   --  6.8  --   HGB 8.3* 7.3* 8.0* 8.6* 8.1*  HCT 27.0* 24.3* 25.9* 27.9* 26.8*  MCV 108.0* 106.6* 105.3* 103.0* 101.1*  PLT 422* 383 489* 497* 491*    Cardiac Enzymes: No results for input(s): CKTOTAL, CKMB, CKMBINDEX, TROPONINI in the last 168 hours.  BNP (last 3 results) Recent Labs    01/30/21 0024  BNP 177.2*    ProBNP (last 3 results) No results for input(s): PROBNP in the last 8760 hours.  Radiological Exams: CT Angio Chest Pulmonary Embolism (PE) W or WO Contrast  Result Date: 02/14/2021 CLINICAL DATA:  Short of breath.  Concern for pulmonary embolism. EXAM: CT ANGIOGRAPHY CHEST WITH CONTRAST TECHNIQUE: Multidetector CT imaging of the chest was performed using the standard protocol during bolus administration of intravenous contrast. Multiplanar CT image reconstructions and MIPs were obtained to evaluate the vascular anatomy. CONTRAST:  74mL OMNIPAQUE IOHEXOL 350 MG/ML SOLN COMPARISON:  Prior  chest radiographs. FINDINGS: Cardiovascular: Pulmonary arteries are well opacified. There is no evidence of a pulmonary embolism. Ascending thoracic aorta is dilated to 5.7 cm. No dissection. Mild atherosclerosis. Arch branch vessels are widely patent. Heart is mildly enlarged.  No pericardial effusion. Mediastinum/Nodes: Tracheostomy tube tip lies 2 cm above the carina. Nasal/orogastric tube passes below the diaphragm well into the stomach. No neck base, mediastinal or hilar masses or enlarged lymph nodes. AP diameter of the trachea is narrowed distal to the tracheostomy consistent with an extra tori phase exam. Lungs/Pleura: Small, right greater than left, pleural effusions. Right upper lobe consolidation borders the  oblique fissure. Small area of consolidation in the posterior left upper lobe. Additional areas linear and patchy airspace opacity bilaterally. Mild interstitial thickening with hazy ground-glass opacities are noted in the lower lungs. No pneumothorax. Upper Abdomen: Lobulated mass on the right that appears contiguous with the liver. This is poorly defined due to lack liver contrast. Bilateral adrenal masses, larger on the left, 2.6 x 1.4 cm. No acute findings. Musculoskeletal: No fracture or acute finding.  No bone lesion. Review of the MIP images confirms the above findings. IMPRESSION: 1. No evidence of a pulmonary embolism. 2. Lung opacities, most evident in the right upper lobe, consistent with multifocal pneumonia. Additional hazy ground-glass opacities and interstitial thickening noted in the lower lungs, which along with mild cardiomegaly and small, right greater than left, pleural effusions, raises suspicion for mild congestive heart failure. 3. Dilated ascending thoracic aorta to 5.7 cm. Recommend semi-annual imaging followup by CTA or MRA and referral to cardiothoracic surgery if not already obtained. This recommendation follows 2010 ACCF/AHA/AATS/ACR/ASA/SCA/SCAI/SIR/STS/SVM Guidelines for the Diagnosis and Management of Patients With Thoracic Aortic Disease. Circulation. 2010; 121: [id number]. Aortic aneurysm NOS (ICD10-I71.9) 4. Poorly defined possible mass arising from the posterior margin of the liver. Bilateral adrenal masses, largest 2.6 cm in greatest dimension. Recommend follow-up abdomen and pelvis CT with contrast on a nonemergent basis. Aortic aneurysm NOS (ICD10-I71.9). Electronically Signed   By: Lajean Manes M.D.   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 Severe sepsis treated in resolution we will follow Streptococcal pneumonia treated again slowly improving we will continue to monitor closely Altered mental status no change we will continue to follow   I have personally seen and evaluated the patient, evaluated laboratory and imaging results, formulated the assessment and plan and placed orders. The Patient requires high complexity decision making with multiple systems involvement.  Rounds were done with the Respiratory Therapy Director and Staff therapists and discussed with nursing staff also.  Allyne Gee, MD [facility] Pulmonary Critical Care Medicine Sleep Medicine

## 2021-02-16 ENCOUNTER — Other Ambulatory Visit (HOSPITAL_COMMUNITY): Payer: Self-pay

## 2021-02-16 MED ORDER — IOHEXOL 300 MG/ML  SOLN
100.0000 mL | Freq: Once | INTRAMUSCULAR | Status: AC | PRN
  Administered 2021-02-16: 02:00:00 100 mL via INTRAVENOUS

## 2021-02-16 NOTE — Progress Notes (Signed)
Pulmonary [place]   PULMONARY CRITICAL CARE SERVICE  PROGRESS NOTE     Rhonda Winters  ZOX:[id number]  DOB: [birth year]   DOA: 02/12/2021  Referring Physician: Satira Sark, MD  HPI: Rhonda Winters is a 75 y.o. female being followed for ventilator/airway/oxygen weaning Acute on Chronic Respiratory Failure.  Patient is afebrile resting comfortably on T collar has very copious secretions which will be concerning factors for his being able to wean further  Medications: Reviewed on Rounds  Physical Exam:  Vitals: Temperature is 97.5 pulse 90 respiratory rate is 30 blood pressure is 164/67 saturations 95%  Ventilator Settings on T collar FiO2 of 28%  General: Comfortable at this time Neck: supple Cardiovascular: no malignant arrhythmias Respiratory: No rhonchi no rales are noted at this time Skin: no rash seen on limited exam Musculoskeletal: No gross abnormality Psychiatric:unable to assess Neurologic:no involuntary movements         Lab Data:   Basic Metabolic Panel: Recent Labs  Lab 02/10/21 0523 02/11/21 0541 02/12/21 0548 02/12/21 1749 02/13/21 0318 02/14/21 0606 02/15/21 0625  NA 150* 149* 146* 141 141 141 138  K 3.1* 2.6* 3.6 3.7 3.7 3.1* 3.1*  CL 115* 114* 114* 108 106 101 100  CO2 28 27 27 25 27 31 29   GLUCOSE 147* 170* 151* 100* 176* 179* 192*  BUN 25* 25* 21 18 15 14 16   CREATININE 0.65 0.81 0.60 0.54 0.55 0.63 0.58  CALCIUM 9.7 9.1 9.1 9.6 9.5 9.3 9.2  MG 1.7 1.7 1.8  --   --   --   --   PHOS 3.2 2.8  --   --   --   --   --     ABG: Recent Labs  Lab 02/12/21 1655  PHART 7.451*  PCO2ART 39.9  PO2ART 115*  HCO3 27.4  O2SAT 98.7    Liver Function Tests: Recent Labs  Lab 02/12/21 1749 02/14/21 0606 02/15/21 0625  AST 24 22 19   ALT 52* 39 33  ALKPHOS 73 72 74  BILITOT 0.5 0.5 0.4  PROT 6.0* 5.8* 6.0*  ALBUMIN 2.0* 1.8* 1.8*   No results for input(s): LIPASE, AMYLASE in the last 168  hours. No results for input(s): AMMONIA in the last 168 hours.  CBC: Recent Labs  Lab 02/10/21 0523 02/11/21 0541 02/13/21 0318 02/15/21 0625  WBC 9.2 10.4 9.3 12.4*  NEUTROABS  --   --  6.8  --   HGB 7.3* 8.0* 8.6* 8.1*  HCT 24.3* 25.9* 27.9* 26.8*  MCV 106.6* 105.3* 103.0* 101.1*  PLT 383 489* 497* 491*    Cardiac Enzymes: No results for input(s): CKTOTAL, CKMB, CKMBINDEX, TROPONINI in the last 168 hours.  BNP (last 3 results) Recent Labs    01/30/21 0024  BNP 177.2*    ProBNP (last 3 results) No results for input(s): PROBNP in the last 8760 hours.  Radiological Exams: CT Angio Chest Pulmonary Embolism (PE) W or WO Contrast  Result Date: 02/14/2021 CLINICAL DATA:  Short of breath.  Concern for pulmonary embolism. EXAM: CT ANGIOGRAPHY CHEST WITH CONTRAST TECHNIQUE: Multidetector CT imaging of the chest was performed using the standard protocol during bolus administration of intravenous contrast. Multiplanar CT image reconstructions and MIPs were obtained to evaluate the vascular anatomy. CONTRAST:  75mL OMNIPAQUE IOHEXOL 350 MG/ML SOLN COMPARISON:  Prior chest radiographs. FINDINGS: Cardiovascular: Pulmonary arteries are well opacified. There is no evidence of a pulmonary embolism. Ascending thoracic aorta is dilated to 5.7  cm. No dissection. Mild atherosclerosis. Arch branch vessels are widely patent. Heart is mildly enlarged.  No pericardial effusion. Mediastinum/Nodes: Tracheostomy tube tip lies 2 cm above the carina. Nasal/orogastric tube passes below the diaphragm well into the stomach. No neck base, mediastinal or hilar masses or enlarged lymph nodes. AP diameter of the trachea is narrowed distal to the tracheostomy consistent with an extra tori phase exam. Lungs/Pleura: Small, right greater than left, pleural effusions. Right upper lobe consolidation borders the oblique fissure. Small area of consolidation in the posterior left upper lobe. Additional areas linear and patchy  airspace opacity bilaterally. Mild interstitial thickening with hazy ground-glass opacities are noted in the lower lungs. No pneumothorax. Upper Abdomen: Lobulated mass on the right that appears contiguous with the liver. This is poorly defined due to lack liver contrast. Bilateral adrenal masses, larger on the left, 2.6 x 1.4 cm. No acute findings. Musculoskeletal: No fracture or acute finding.  No bone lesion. Review of the MIP images confirms the above findings. IMPRESSION: 1. No evidence of a pulmonary embolism. 2. Lung opacities, most evident in the right upper lobe, consistent with multifocal pneumonia. Additional hazy ground-glass opacities and interstitial thickening noted in the lower lungs, which along with mild cardiomegaly and small, right greater than left, pleural effusions, raises suspicion for mild congestive heart failure. 3. Dilated ascending thoracic aorta to 5.7 cm. Recommend semi-annual imaging followup by CTA or MRA and referral to cardiothoracic surgery if not already obtained. This recommendation follows 2010 ACCF/AHA/AATS/ACR/ASA/SCA/SCAI/SIR/STS/SVM Guidelines for the Diagnosis and Management of Patients With Thoracic Aortic Disease. Circulation. 2010; 121: [id number]. Aortic aneurysm NOS (ICD10-I71.9) 4. Poorly defined possible mass arising from the posterior margin of the liver. Bilateral adrenal masses, largest 2.6 cm in greatest dimension. Recommend follow-up abdomen and pelvis CT with contrast on a nonemergent basis. Aortic aneurysm NOS (ICD10-I71.9). Electronically Signed   By: Lajean Manes M.D.   On: 02/14/2021 16:34   CT ABDOMEN PELVIS W CONTRAST  Result Date: 02/16/2021 CLINICAL DATA:  Left upper quadrant pain. EXAM: CT ABDOMEN AND PELVIS WITH CONTRAST TECHNIQUE: Multidetector CT imaging of the abdomen and pelvis was performed using the standard protocol following bolus administration of intravenous contrast. CONTRAST:  182mL OMNIPAQUE IOHEXOL 300 MG/ML  SOLN COMPARISON:   None. FINDINGS: Lower chest: Mild atelectasis is seen within the bilateral lung bases. Small bilateral pleural effusions are noted. Hepatobiliary: An 8.8 cm x 7.7 cm lobulated, exophytic area of heterogeneous low attenuation is seen within the posterior aspect of the right lobe of the liver. No gallstones, gallbladder wall thickening, or biliary dilatation. Pancreas: Unremarkable. No pancreatic ductal dilatation or surrounding inflammatory changes. Spleen: Normal in size without focal abnormality. Adrenals/Urinary Tract: There is a 1.6 cm x 0.9 cm low-attenuation right adrenal mass. An additional 2.4 cm x 1.5 cm low-attenuation left adrenal mass is noted. Kidneys are normal in size, without renal calculi or hydronephrosis. A 3.1 cm x 2.7 cm simple cyst is seen within the anterior aspect of the mid left kidney. A Foley catheter is seen within the lumen of the urinary bladder. A mild amount of air is also noted within the bladder lumen. Stomach/Bowel: Stomach is within normal limits. Appendix appears normal. Moderate to marked severity mildly asymmetric rectal wall thickening is seen (axial CT images 87 through 90, CT series 3). A rectal balloon is also in place. No evidence of bowel dilatation. Vascular/Lymphatic: Aortic atherosclerosis. No enlarged abdominal or pelvic lymph nodes. Reproductive: Status post hysterectomy. No adnexal masses. Other: No abdominal wall  hernia or abnormality. No abdominopelvic ascites. Musculoskeletal: Degenerative changes seen throughout the lumbar spine IMPRESSION: 1. Rectal wall thickening which is concerning for the presence of an underlying rectal mass. Correlation with physical examination and colonoscopy is recommended. 2. 8.8 cm x 7.7 cm lobulated, exophytic area of heterogeneous low attenuation within the posterior aspect of the right lobe of the liver. While this may represent a large hemangioma, correlation with follow-up abdomen pelvis CT is recommended to exclude the presence  of an underlying neoplastic process. 3. Bilateral low-attenuation adrenal nodules, likely consistent with adrenal adenomas. 4. 3.1 cm x 2.7 cm simple left renal cyst. 5. Small bilateral pleural effusions. 6. Aortic atherosclerosis. Aortic Atherosclerosis (ICD10-I70.0). Electronically Signed   By: Virgina Norfolk M.D.   On: 02/16/2021 02:54    Assessment/Plan Active Problems:   Cerebrovascular accident (CVA) due to occlusion of right posterior cerebral artery (HCC)   Sepsis ([place])   Streptococcal pneumonia ([place])   Acute on chronic respiratory failure with hypoxia (HCC)   AMS (altered mental status)   Acute on chronic respiratory failure hypoxia patient is on T-piece on 28% oxygen saturations are good plan is going to be to continue with oxygen therapy and titrate as tolerated continue pulmonary toilet Severe sepsis resolved hemodynamics are stable Streptococcal pneumonia has been treated with antibiotics patient still has significant residual secretions Acute stroke supportive care Altered mental status secondary to above   I have personally seen and evaluated the patient, evaluated laboratory and imaging results, formulated the assessment and plan and placed orders. The Patient requires high complexity decision making with multiple systems involvement.  Rounds were done with the Respiratory Therapy Director and Staff therapists and discussed with nursing staff also.  Allyne Gee, MD [facility] Pulmonary Critical Care Medicine Sleep Medicine

## 2021-02-16 NOTE — Progress Notes (Signed)
°  Echocardiogram 2D Echocardiogram has been performed.  Rhonda Winters 02/16/2021, 10:55 AM

## 2021-02-17 LAB — ECHOCARDIOGRAM COMPLETE BUBBLE STUDY: S' Lateral: 2.7 cm

## 2021-02-17 NOTE — Progress Notes (Signed)
Pulmonary [place]   PULMONARY CRITICAL CARE SERVICE  PROGRESS NOTE     Rhonda Winters  Rhonda Winters  DOB: 10/19/1945   DOA: 02/12/2021  Referring Physician: Satira Sark, Rhonda Winters  HPI: Rhonda Winters is a 75 y.o. female being followed for ventilator/airway/oxygen weaning Acute on Chronic Respiratory Failure.  Patient is on T collar resting comfortably.  Sutures were removed secretions are still moderate  Medications: Reviewed on Rounds  Physical Exam:  Vitals: Temperature is 96.7 pulse 75 respiratory 17 blood pressure is 144/81 saturations 99%  Ventilator Settings on T collar FiO2 of 28%  General: Comfortable at this time Neck: supple Cardiovascular: no malignant arrhythmias Respiratory: Scattered rhonchi expansion is equal Skin: no rash seen on limited exam Musculoskeletal: No gross abnormality Psychiatric:unable to assess Neurologic:no involuntary movements         Lab Data:   Basic Metabolic Panel: Recent Labs  Lab 02/11/21 0541 02/12/21 0548 02/12/21 1749 02/13/21 0318 02/14/21 0606 02/15/21 0625  NA 149* 146* 141 141 141 138  K 2.6* 3.6 3.7 3.7 3.1* 3.1*  CL 114* 114* 108 106 101 100  CO2 27 27 25 27 31 29   GLUCOSE 170* 151* 100* 176* 179* 192*  BUN 25* 21 18 15 14 16   CREATININE 0.81 0.60 0.54 0.55 0.63 0.58  CALCIUM 9.1 9.1 9.6 9.5 9.3 9.2  MG 1.7 1.8  --   --   --   --   PHOS 2.8  --   --   --   --   --     ABG: Recent Labs  Lab 02/12/21 1655  PHART 7.451*  PCO2ART 39.9  PO2ART 115*  HCO3 27.4  O2SAT 98.7    Liver Function Tests: Recent Labs  Lab 02/12/21 1749 02/14/21 0606 02/15/21 0625  AST 24 22 19   ALT 52* 39 33  ALKPHOS 73 72 74  BILITOT 0.5 0.5 0.4  PROT 6.0* 5.8* 6.0*  ALBUMIN 2.0* 1.8* 1.8*   No results for input(s): LIPASE, AMYLASE in the last 168 hours. No results for input(s): AMMONIA in the last 168 hours.  CBC: Recent Labs  Lab 02/11/21 0541 02/13/21 0318  02/15/21 0625  WBC 10.4 9.3 12.4*  NEUTROABS  --  6.8  --   HGB 8.0* 8.6* 8.1*  HCT 25.9* 27.9* 26.8*  MCV 105.3* 103.0* 101.1*  PLT 489* 497* 491*    Cardiac Enzymes: No results for input(s): CKTOTAL, CKMB, CKMBINDEX, TROPONINI in the last 168 hours.  BNP (last 3 results) Recent Labs    01/30/21 0024  BNP 177.2*    ProBNP (last 3 results) No results for input(s): PROBNP in the last 8760 hours.  Radiological Exams: CT ABDOMEN PELVIS W CONTRAST  Result Date: 02/16/2021 CLINICAL DATA:  Left upper quadrant pain. EXAM: CT ABDOMEN AND PELVIS WITH CONTRAST TECHNIQUE: Multidetector CT imaging of the abdomen and pelvis was performed using the standard protocol following bolus administration of intravenous contrast. CONTRAST:  157mL OMNIPAQUE IOHEXOL 300 MG/ML  SOLN COMPARISON:  None. FINDINGS: Lower chest: Mild atelectasis is seen within the bilateral lung bases. Small bilateral pleural effusions are noted. Hepatobiliary: An 8.8 cm x 7.7 cm lobulated, exophytic area of heterogeneous low attenuation is seen within the posterior aspect of the right lobe of the liver. No gallstones, gallbladder wall thickening, or biliary dilatation. Pancreas: Unremarkable. No pancreatic ductal dilatation or surrounding inflammatory changes. Spleen: Normal in size without focal abnormality. Adrenals/Urinary Tract: There is a 1.6 cm x 0.9  cm low-attenuation right adrenal mass. An additional 2.4 cm x 1.5 cm low-attenuation left adrenal mass is noted. Kidneys are normal in size, without renal calculi or hydronephrosis. A 3.1 cm x 2.7 cm simple cyst is seen within the anterior aspect of the mid left kidney. A Foley catheter is seen within the lumen of the urinary bladder. A mild amount of air is also noted within the bladder lumen. Stomach/Bowel: Stomach is within normal limits. Appendix appears normal. Moderate to marked severity mildly asymmetric rectal wall thickening is seen (axial CT images 87 through 90, CT series  3). A rectal balloon is also in place. No evidence of bowel dilatation. Vascular/Lymphatic: Aortic atherosclerosis. No enlarged abdominal or pelvic lymph nodes. Reproductive: Status post hysterectomy. No adnexal masses. Other: No abdominal wall hernia or abnormality. No abdominopelvic ascites. Musculoskeletal: Degenerative changes seen throughout the lumbar spine IMPRESSION: 1. Rectal wall thickening which is concerning for the presence of an underlying rectal mass. Correlation with physical examination and colonoscopy is recommended. 2. 8.8 cm x 7.7 cm lobulated, exophytic area of heterogeneous low attenuation within the posterior aspect of the right lobe of the liver. While this may represent a large hemangioma, correlation with follow-up abdomen pelvis CT is recommended to exclude the presence of an underlying neoplastic process. 3. Bilateral low-attenuation adrenal nodules, likely consistent with adrenal adenomas. 4. 3.1 cm x 2.7 cm simple left renal cyst. 5. Small bilateral pleural effusions. 6. Aortic atherosclerosis. Aortic Atherosclerosis (ICD10-I70.0). Electronically Signed   By: Virgina Norfolk M.D.   On: 02/16/2021 02:54    Assessment/Plan Active Problems:   Cerebrovascular accident (CVA) due to occlusion of right posterior cerebral artery (HCC)   Sepsis ([place])   Streptococcal pneumonia ([place])   Acute on chronic respiratory failure with hypoxia (HCC)   AMS (altered mental status)   Acute on chronic respiratory failure with hypoxia patient is on the T collar on 28% FiO2.  We will continue with aggressive pulmonary toilet secretion management supportive care. Severe sepsis resolved hemodynamics are stable Streptococcal pneumonia has been treated slow improvement we will continue to monitor along closely. Altered mental status supportive care Acute stroke patient's at her baseline it appears the mental status has not shown significant improvement reportedly   I have personally seen and  evaluated the patient, evaluated laboratory and imaging results, formulated the assessment and plan and placed orders. The Patient requires high complexity decision making with multiple systems involvement.  Rounds were done with the Respiratory Therapy Director and Staff therapists and discussed with nursing staff also.  Rhonda Gee, Rhonda Winters [facility] Pulmonary Critical Care Medicine Sleep Medicine

## 2021-02-18 LAB — CBC
HCT: 25.8 % — ABNORMAL LOW (ref 36.0–46.0)
Hemoglobin: 8.3 g/dL — ABNORMAL LOW (ref 12.0–15.0)
MCH: 32.2 pg (ref 26.0–34.0)
MCHC: 32.2 g/dL (ref 30.0–36.0)
MCV: 100 fL (ref 80.0–100.0)
Platelets: 435 10*3/uL — ABNORMAL HIGH (ref 150–400)
RBC: 2.58 MIL/uL — ABNORMAL LOW (ref 3.87–5.11)
RDW: 14 % (ref 11.5–15.5)
WBC: 8.6 10*3/uL (ref 4.0–10.5)
nRBC: 0 % (ref 0.0–0.2)

## 2021-02-18 LAB — BASIC METABOLIC PANEL
Anion gap: 9 (ref 5–15)
BUN: 15 mg/dL (ref 8–23)
CO2: 33 mmol/L — ABNORMAL HIGH (ref 22–32)
Calcium: 9.3 mg/dL (ref 8.9–10.3)
Chloride: 95 mmol/L — ABNORMAL LOW (ref 98–111)
Creatinine, Ser: 0.67 mg/dL (ref 0.44–1.00)
GFR, Estimated: >60 mL/min (ref >60)
Glucose, Bld: 225 mg/dL — ABNORMAL HIGH (ref 70–99)
Potassium: 2.8 mmol/L — ABNORMAL LOW (ref 3.5–5.1)
Sodium: 137 mmol/L (ref 135–145)

## 2021-02-18 LAB — MAGNESIUM: Magnesium: 1.8 mg/dL (ref 1.7–2.4)

## 2021-02-18 NOTE — Progress Notes (Signed)
Pulmonary [place]   PULMONARY CRITICAL CARE SERVICE  PROGRESS NOTE     Rhonda Winters  PFX:[id number]  DOB: 1946/01/22   DOA: 02/12/2021  Referring Physician: Satira Sark, MD  HPI: Rhonda Winters is a 75 y.o. female being followed for ventilator/airway/oxygen weaning Acute on Chronic Respiratory Failure.  Patient is comfortable without distress at this time is on T collar should be able to try PMV secretions are still copious  Medications: Reviewed on Rounds  Physical Exam:  Vitals: Temperature is 96.8 pulse 77 respiratory rate is 26 blood pressure is 124/71 saturations 93%  Ventilator Settings off the ventilator on T collar FiO2 28%  General: Comfortable at this time Neck: supple Cardiovascular: no malignant arrhythmias Respiratory: No rhonchi no rales are noted at this time Skin: no rash seen on limited exam Musculoskeletal: No gross abnormality Psychiatric:unable to assess Neurologic:no involuntary movements         Lab Data:   Basic Metabolic Panel: Recent Labs  Lab 02/12/21 0548 02/12/21 1749 02/13/21 0318 02/14/21 0606 02/15/21 0625 02/18/21 0410  NA 146* 141 141 141 138 137  K 3.6 3.7 3.7 3.1* 3.1* 2.8*  CL 114* 108 106 101 100 95*  CO2 27 25 27 31 29  33*  GLUCOSE 151* 100* 176* 179* 192* 225*  BUN 21 18 15 14 16 15   CREATININE 0.60 0.54 0.55 0.63 0.58 0.67  CALCIUM 9.1 9.6 9.5 9.3 9.2 9.3  MG 1.8  --   --   --   --  1.8    ABG: Recent Labs  Lab 02/12/21 1655  PHART 7.451*  PCO2ART 39.9  PO2ART 115*  HCO3 27.4  O2SAT 98.7    Liver Function Tests: Recent Labs  Lab 02/12/21 1749 02/14/21 0606 02/15/21 0625  AST 24 22 19   ALT 52* 39 33  ALKPHOS 73 72 74  BILITOT 0.5 0.5 0.4  PROT 6.0* 5.8* 6.0*  ALBUMIN 2.0* 1.8* 1.8*   No results for input(s): LIPASE, AMYLASE in the last 168 hours. No results for input(s): AMMONIA in the last 168 hours.  CBC: Recent Labs  Lab 02/13/21 0318  02/15/21 0625 02/18/21 0410  WBC 9.3 12.4* 8.6  NEUTROABS 6.8  --   --   HGB 8.6* 8.1* 8.3*  HCT 27.9* 26.8* 25.8*  MCV 103.0* 101.1* 100.0  PLT 497* 491* 435*    Cardiac Enzymes: No results for input(s): CKTOTAL, CKMB, CKMBINDEX, TROPONINI in the last 168 hours.  BNP (last 3 results) Recent Labs    01/30/21 0024  BNP 177.2*    ProBNP (last 3 results) No results for input(s): PROBNP in the last 8760 hours.  Radiological Exams: ECHOCARDIOGRAM COMPLETE BUBBLE STUDY  Result Date: 02/17/2021    ECHOCARDIOGRAM REPORT   Patient Name:   Rhonda Winters [facility] Date of Exam: 02/16/2021 Medical Rec #:  [id number]        Height:       62.0 in Accession #:    [id number]       Weight:       234.3 lb Date of Birth:  19-Feb-1946         BSA:          2.045 m Patient Age:    70 years         BP:           159/69 mmHg Patient Gender: F  HR:           88 bpm. Exam Location:  Inpatient Procedure: 2D Echo, Cardiac Doppler, Color Doppler and Saline Contrast Bubble            Study Indications:     Edema [782.3.ICD-9-CM]                  Valvular incompetence [2  History:         Patient has prior history of Echocardiogram examinations, most                  recent 01/30/2021. Stroke; Risk Factors:Hypertension and                  Diabetes.  Sonographer:     Bernadene Person RDCS Referring Phys:  [id number] [place] Diagnosing Phys: Dixie Dials MD IMPRESSIONS  1. Left ventricular ejection fraction, by estimation, is 60 to 65%. The left ventricle has normal function. The left ventricle has no regional wall motion abnormalities. There is mild left ventricular hypertrophy. Left ventricular diastolic parameters are indeterminate.  2. Right ventricular systolic function is normal. The right ventricular size is normal. There is normal pulmonary artery systolic pressure.  3. Left atrial size was mildly dilated.  4. Right atrial size was mildly dilated.  5. The pericardial effusion is circumferential.  6. The  mitral valve is degenerative. Mild mitral valve regurgitation.  7. The aortic valve is tricuspid. There is mild calcification of the aortic valve. There is mild thickening of the aortic valve. Aortic valve regurgitation is not visualized. Aortic valve sclerosis is present, with no evidence of aortic valve stenosis.  8. Aortic dilatation noted. 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 There is Moderate (Grade III) atheroma plaque involving the aortic root and ascending aorta.  9. The inferior vena cava is normal in size with greater than 50% respiratory variability, suggesting right atrial pressure of 3 mmHg. Conclusion(s)/Recommendation(s): CVTS consult for aortic dilataion growing 1 cm in 7 months. FINDINGS  Left Ventricle: Left ventricular ejection fraction, by estimation, is 60 to 65%. The left ventricle has normal function. The left ventricle has no regional wall motion abnormalities. The left ventricular internal cavity size was normal in size. There is  mild left ventricular hypertrophy. Left ventricular diastolic parameters are indeterminate. Right Ventricle: The right ventricular size is normal. No increase in right ventricular wall thickness. Right ventricular systolic function is normal. There is normal pulmonary artery systolic pressure. The tricuspid regurgitant velocity is 2.56 m/s, and  with an assumed right atrial pressure of 3 mmHg, the estimated right ventricular systolic pressure is 77.4 mmHg. Left Atrium: Left atrial size was mildly dilated. Right Atrium: Right atrial size was mildly dilated. Pericardium: Trivial pericardial effusion is present. The pericardial effusion is circumferential. Mitral Valve: The mitral valve is degenerative in appearance. There is mild thickening of the mitral valve leaflet(s). There is mild calcification of the mitral valve leaflet(s). Mild mitral annular calcification. Mild mitral valve regurgitation. Tricuspid Valve: The tricuspid valve  is normal in structure. Tricuspid valve regurgitation is mild. Aortic Valve: The aortic valve is tricuspid. There is mild calcification of the aortic valve. There is mild thickening of the aortic valve. There is mild to moderate aortic valve annular calcification. Aortic valve regurgitation is not visualized. Aortic  valve sclerosis is present, with no evidence of aortic valve stenosis. Pulmonic Valve: The pulmonic valve was normal in structure. Pulmonic valve regurgitation is not visualized. Aorta:  Aortic dilatation noted. 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 There is moderate (Grade III) atheroma plaque involving the aortic root and ascending aorta. Venous: The inferior vena cava is normal in size with greater than 50% respiratory variability, suggesting right atrial pressure of 3 mmHg. IAS/Shunts: The atrial septum is grossly normal. Agitated saline contrast was given intravenously to evaluate for intracardiac shunting.  LEFT VENTRICLE PLAX 2D LVIDd:         4.20 cm LVIDs:         2.70 cm LV PW:         1.10 cm LV IVS:        1.20 cm LVOT diam:     2.00 cm LV SV:         68 LV SV Index:   33 LVOT Area:     3.14 cm  RIGHT VENTRICLE RV S prime:     14.10 cm/s TAPSE (M-mode): 2.1 cm LEFT ATRIUM             Index        RIGHT ATRIUM           Index LA diam:        2.70 cm 1.32 cm/m   RA Area:     21.00 cm LA Vol (A2C):   49.3 ml 24.11 ml/m  RA Volume:   64.20 ml  31.40 ml/m LA Vol (A4C):   55.2 ml 26.99 ml/m LA Biplane Vol: 53.6 ml 26.21 ml/m  AORTIC VALVE LVOT Vmax:   110.68 cm/s LVOT Vmean:  75.400 cm/s LVOT VTI:    0.215 m  AORTA Ao Root diam: 3.40 cm Ao Asc diam:  5.70 cm TRICUSPID VALVE TR Peak grad:   26.2 mmHg TR Vmax:        256.00 cm/s  SHUNTS Systemic VTI:  0.22 m Systemic Diam: 2.00 cm Dixie Dials MD Electronically signed by Dixie Dials MD Signature Date/Time: 02/17/2021/6:43:08 PM    Final     Assessment/Plan Active Problems:   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  Continue secretion management pulmonary toilet Severe sepsis treated resolved hemodynamics are stable Streptococcal pneumonia again improving clinically we will monitor still has copious secretions Altered mental status patient is at baseline Acute stroke supportive care therapy as tolerated   I have personally seen and evaluated the patient, evaluated laboratory and imaging results, formulated the assessment and plan and placed orders. The Patient requires high complexity decision making with multiple systems involvement.  Rounds were done with the Respiratory Therapy Director and Staff therapists and discussed with nursing staff also.  Allyne Gee, MD [facility] Pulmonary Critical Care Medicine Sleep Medicine

## 2021-02-19 ENCOUNTER — Other Ambulatory Visit (HOSPITAL_COMMUNITY): Payer: Self-pay

## 2021-02-19 LAB — POTASSIUM: Potassium: 4.2 mmol/L (ref 3.5–5.1)

## 2021-02-19 NOTE — Progress Notes (Signed)
Pulmonary [place]   PULMONARY CRITICAL CARE SERVICE  PROGRESS NOTE     Rhonda Winters  OVZ:[id number]  DOB: 05/11/1945   DOA: 02/12/2021  Referring Physician: Satira Sark, MD  HPI: Rhonda Winters is a 75 y.o. female being followed for ventilator/airway/oxygen weaning Acute on Chronic Respiratory Failure.  Patient is on T collar currently on 28% FiO2  Medications: Reviewed on Rounds  Physical Exam:  Vitals: Temperature is 96.8 pulse 61 respiratory rate is 17 blood pressure is 122/65 saturations 98%  Ventilator Settings patient is on T collar 28% FiO2  General: Comfortable at this time Neck: supple Cardiovascular: no malignant arrhythmias Respiratory: Scattered rhonchi expansion is equal Skin: no rash seen on limited exam Musculoskeletal: No gross abnormality Psychiatric:unable to assess Neurologic:no involuntary movements         Lab Data:   Basic Metabolic Panel: Recent Labs  Lab 02/12/21 1749 02/13/21 0318 02/14/21 0606 02/15/21 0625 02/18/21 0410  NA 141 141 141 138 137  K 3.7 3.7 3.1* 3.1* 2.8*  CL 108 106 101 100 95*  CO2 25 27 31 29  33*  GLUCOSE 100* 176* 179* 192* 225*  BUN 18 15 14 16 15   CREATININE 0.54 0.55 0.63 0.58 0.67  CALCIUM 9.6 9.5 9.3 9.2 9.3  MG  --   --   --   --  1.8    ABG: Recent Labs  Lab 02/12/21 1655  PHART 7.451*  PCO2ART 39.9  PO2ART 115*  HCO3 27.4  O2SAT 98.7    Liver Function Tests: Recent Labs  Lab 02/12/21 1749 02/14/21 0606 02/15/21 0625  AST 24 22 19   ALT 52* 39 33  ALKPHOS 73 72 74  BILITOT 0.5 0.5 0.4  PROT 6.0* 5.8* 6.0*  ALBUMIN 2.0* 1.8* 1.8*   No results for input(s): LIPASE, AMYLASE in the last 168 hours. No results for input(s): AMMONIA in the last 168 hours.  CBC: Recent Labs  Lab 02/13/21 0318 02/15/21 0625 02/18/21 0410  WBC 9.3 12.4* 8.6  NEUTROABS 6.8  --   --   HGB 8.6* 8.1* 8.3*  HCT 27.9* 26.8* 25.8*  MCV 103.0* 101.1*  100.0  PLT 497* 491* 435*    Cardiac Enzymes: No results for input(s): CKTOTAL, CKMB, CKMBINDEX, TROPONINI in the last 168 hours.  BNP (last 3 results) Recent Labs    01/30/21 0024  BNP 177.2*    ProBNP (last 3 results) No results for input(s): PROBNP in the last 8760 hours.  Radiological Exams: No results found.  Assessment/Plan Active Problems:   Cerebrovascular accident (CVA) due to occlusion of right posterior cerebral artery (HCC)   Sepsis ([place])   Streptococcal pneumonia ([place])   Acute on chronic respiratory failure with hypoxia (HCC)   AMS (altered mental status)   Acute on chronic respiratory failure with hypoxia plan is going to be to continue with the T collar as tolerated change the trach out to a cuffless trach Severe sepsis treated resolved hemodynamics are stable Streptococcal pneumoniae again treated slowly improving AMS supportive care Acute stroke therapy as tolerated   I have personally seen and evaluated the patient, evaluated laboratory and imaging results, formulated the assessment and plan and placed orders. The Patient requires high complexity decision making with multiple systems involvement.  Rounds were done with the Respiratory Therapy Director and Staff therapists and discussed with nursing staff also.  Allyne Gee, MD [facility] Pulmonary Critical Care Medicine Sleep Medicine

## 2021-02-20 NOTE — Progress Notes (Signed)
Pulmonary [place]   PULMONARY CRITICAL CARE SERVICE  PROGRESS NOTE     Rhonda Winters  DXA:[id number]  DOB: March 18, 1945   DOA: 02/12/2021  Referring Physician: Satira Sark, MD  HPI: Rhonda Winters is a 75 y.o. female being followed for ventilator/airway/oxygen weaning Acute on Chronic Respiratory Failure.  Patient is afebrile without distress at this time has been on 35% oxygen good saturations are noted  Medications: Reviewed on Rounds  Physical Exam:  Vitals: Temperature is 97.8 pulse 76 respiratory is 18 blood pressure 155/76 saturations 97%  Ventilator Settings on T collar FiO2 35%  General: Comfortable at this time Neck: supple Cardiovascular: no malignant arrhythmias Respiratory: No rhonchi no rales are noted at this time Skin: no rash seen on limited exam Musculoskeletal: No gross abnormality Psychiatric:unable to assess Neurologic:no involuntary movements         Lab Data:   Basic Metabolic Panel: Recent Labs  Lab 02/14/21 0606 02/15/21 0625 02/18/21 0410 02/19/21 1143  NA 141 138 137  --   K 3.1* 3.1* 2.8* 4.2  CL 101 100 95*  --   CO2 31 29 33*  --   GLUCOSE 179* 192* 225*  --   BUN 14 16 15   --   CREATININE 0.63 0.58 0.67  --   CALCIUM 9.3 9.2 9.3  --   MG  --   --  1.8  --     ABG: No results for input(s): PHART, PCO2ART, PO2ART, HCO3, O2SAT in the last 168 hours.  Liver Function Tests: Recent Labs  Lab 02/14/21 0606 02/15/21 0625  AST 22 19  ALT 39 33  ALKPHOS 72 74  BILITOT 0.5 0.4  PROT 5.8* 6.0*  ALBUMIN 1.8* 1.8*   No results for input(s): LIPASE, AMYLASE in the last 168 hours. No results for input(s): AMMONIA in the last 168 hours.  CBC: Recent Labs  Lab 02/15/21 0625 02/18/21 0410  WBC 12.4* 8.6  HGB 8.1* 8.3*  HCT 26.8* 25.8*  MCV 101.1* 100.0  PLT 491* 435*    Cardiac Enzymes: No results for input(s): CKTOTAL, CKMB, CKMBINDEX, TROPONINI in the last 168  hours.  BNP (last 3 results) Recent Labs    01/30/21 0024  BNP 177.2*    ProBNP (last 3 results) No results for input(s): PROBNP in the last 8760 hours.  Radiological Exams: DG Chest Port 1 View  Result Date: 02/19/2021 CLINICAL DATA:  Respiratory failure EXAM: PORTABLE CHEST 1 VIEW COMPARISON:  02/12/2021 FINDINGS: Feeding tube extends beyond the inferior aspect of the film. Tracheostomy appropriately positioned. Right PICC line tip at low SVC. Loop recorder projecting over the left side of the chest. Mild cardiomegaly. Superior mediastinal fullness is chronic, slightly decreased. No pleural effusion or pneumothorax. Right apical airspace disease is similar. Improved left base airspace disease. Pulmonary interstitial prominence without overt congestive failure. IMPRESSION: Improved right upper and left lower lobe airspace disease, favoring multifocal infection. No acute superimposed explanation for respiratory distress. Electronically Signed   By: Abigail Miyamoto M.D.   On: 02/19/2021 17:58    Assessment/Plan Active Problems:   Cerebrovascular accident (CVA) due to occlusion of right posterior cerebral artery (HCC)   Sepsis ([place])   Streptococcal pneumonia ([place])   Acute on chronic respiratory failure with hypoxia (HCC)   AMS (altered mental status)   Acute on chronic respiratory failure with hypoxia we will continue with the T collar patient is on 35% oxygen good saturations are noted.  We  will continue with supportive care Severe sepsis resolved hemodynamics are stable Streptococcal pneumonia treated we will continue to follow along x-ray showing some interstitial prominence and improving airspace disease Altered mental status supportive care Acute stroke patient is at baseline right now   I have personally seen and evaluated the patient, evaluated laboratory and imaging results, formulated the assessment and plan and placed orders. The Patient requires high complexity decision  making with multiple systems involvement.  Rounds were done with the Respiratory Therapy Director and Staff therapists and discussed with nursing staff also.  Allyne Gee, MD [facility] Pulmonary Critical Care Medicine Sleep Medicine

## 2021-02-21 NOTE — Progress Notes (Signed)
Pulmonary [place]   PULMONARY CRITICAL CARE SERVICE  PROGRESS NOTE     Rhonda Winters  [id number]:[id number]  DOB: 10-Oct-1945   DOA: 02/12/2021  Referring Physician: Satira Sark, MD  HPI: Rhonda Winters is a 75 y.o. female being followed for ventilator/airway/oxygen weaning Acute on Chronic Respiratory Failure.  Patient is afebrile without distress remains on T collar on 35% FiO2  Medications: Reviewed on Rounds  Physical Exam:  Vitals: Temperature is 98.5 pulse 70 respiratory 25 blood pressure is 144/70 saturations 99%  Ventilator Settings patient currently is on T collar FiO2 35%  General: Comfortable at this time Neck: supple Cardiovascular: no malignant arrhythmias Respiratory: No rhonchi no rales are noted at this time Skin: no rash seen on limited exam Musculoskeletal: No gross abnormality Psychiatric:unable to assess Neurologic:no involuntary movements         Lab Data:   Basic Metabolic Panel: Recent Labs  Lab 02/15/21 0625 02/18/21 0410 02/19/21 1143  NA 138 137  --   K 3.1* 2.8* 4.2  CL 100 95*  --   CO2 29 33*  --   GLUCOSE 192* 225*  --   BUN 16 15  --   CREATININE 0.58 0.67  --   CALCIUM 9.2 9.3  --   MG  --  1.8  --     ABG: No results for input(s): PHART, PCO2ART, PO2ART, HCO3, O2SAT in the last 168 hours.  Liver Function Tests: Recent Labs  Lab 02/15/21 0625  AST 19  ALT 33  ALKPHOS 74  BILITOT 0.4  PROT 6.0*  ALBUMIN 1.8*   No results for input(s): LIPASE, AMYLASE in the last 168 hours. No results for input(s): AMMONIA in the last 168 hours.  CBC: Recent Labs  Lab 02/15/21 0625 02/18/21 0410  WBC 12.4* 8.6  HGB 8.1* 8.3*  HCT 26.8* 25.8*  MCV 101.1* 100.0  PLT 491* 435*    Cardiac Enzymes: No results for input(s): CKTOTAL, CKMB, CKMBINDEX, TROPONINI in the last 168 hours.  BNP (last 3 results) Recent Labs    01/30/21 0024  BNP 177.2*    ProBNP (last 3  results) No results for input(s): PROBNP in the last 8760 hours.  Radiological Exams: DG Chest Port 1 View  Result Date: 02/19/2021 CLINICAL DATA:  Respiratory failure EXAM: PORTABLE CHEST 1 VIEW COMPARISON:  02/12/2021 FINDINGS: Feeding tube extends beyond the inferior aspect of the film. Tracheostomy appropriately positioned. Right PICC line tip at low SVC. Loop recorder projecting over the left side of the chest. Mild cardiomegaly. Superior mediastinal fullness is chronic, slightly decreased. No pleural effusion or pneumothorax. Right apical airspace disease is similar. Improved left base airspace disease. Pulmonary interstitial prominence without overt congestive failure. IMPRESSION: Improved right upper and left lower lobe airspace disease, favoring multifocal infection. No acute superimposed explanation for respiratory distress. Electronically Signed   By: Abigail Miyamoto M.D.   On: 02/19/2021 17:58    Assessment/Plan Active Problems:   Cerebrovascular accident (CVA) due to occlusion of right posterior cerebral artery (HCC)   Sepsis ([place])   Streptococcal pneumonia ([place])   Acute on chronic respiratory failure with hypoxia (HCC)   AMS (altered mental status)   Acute on chronic respiratory failure with hypoxia on T collar FiO2 35% has a #5 trach in place Severe sepsis resolved hemodynamics are stable at this time Streptococcal pneumonia has been treated with antibiotics Altered mental status supportive care Acute stroke supportive care   I have personally  seen and evaluated the patient, evaluated laboratory and imaging results, formulated the assessment and plan and placed orders. The Patient requires high complexity decision making with multiple systems involvement.  Rounds were done with the Respiratory Therapy Director and Staff therapists and discussed with nursing staff also.  Allyne Gee, MD [facility] Pulmonary Critical Care Medicine Sleep Medicine

## 2021-02-22 NOTE — Progress Notes (Signed)
Pulmonary [place]   PULMONARY CRITICAL CARE SERVICE  PROGRESS NOTE     ZARIYA MINNER  IOM:[id number]  DOB: 18-Jun-1945   DOA: 02/12/2021  Referring Physician: Satira Sark, MD  HPI: Rhonda Winters is a 75 y.o. female being followed for ventilator/airway/oxygen weaning Acute on Chronic Respiratory Failure.  Patient is resting comfortably without distress at this time remains on T collar has copious secretions  Medications: Reviewed on Rounds  Physical Exam:  Vitals: Temperature is 98.6 pulse 70 respiratory is 21 blood pressure is 162/86 saturations 98%  Ventilator Settings off the ventilator on T collar on 28% FiO2  General: Comfortable at this time Neck: supple Cardiovascular: no malignant arrhythmias Respiratory: No rhonchi very coarse breath sounds Skin: no rash seen on limited exam Musculoskeletal: No gross abnormality Psychiatric:unable to assess Neurologic:no involuntary movements         Lab Data:   Basic Metabolic Panel: Recent Labs  Lab 02/18/21 0410 02/19/21 1143  NA 137  --   K 2.8* 4.2  CL 95*  --   CO2 33*  --   GLUCOSE 225*  --   BUN 15  --   CREATININE 0.67  --   CALCIUM 9.3  --   MG 1.8  --     ABG: No results for input(s): PHART, PCO2ART, PO2ART, HCO3, O2SAT in the last 168 hours.  Liver Function Tests: No results for input(s): AST, ALT, ALKPHOS, BILITOT, PROT, ALBUMIN in the last 168 hours. No results for input(s): LIPASE, AMYLASE in the last 168 hours. No results for input(s): AMMONIA in the last 168 hours.  CBC: Recent Labs  Lab 02/18/21 0410  WBC 8.6  HGB 8.3*  HCT 25.8*  MCV 100.0  PLT 435*    Cardiac Enzymes: No results for input(s): CKTOTAL, CKMB, CKMBINDEX, TROPONINI in the last 168 hours.  BNP (last 3 results) Recent Labs    01/30/21 0024  BNP 177.2*    ProBNP (last 3 results) No results for input(s): PROBNP in the last 8760 hours.  Radiological Exams: No  results found.  Assessment/Plan Active Problems:   Cerebrovascular accident (CVA) due to occlusion of right posterior cerebral artery (HCC)   Sepsis ([place])   Streptococcal pneumonia ([place])   Acute on chronic respiratory failure with hypoxia (HCC)   AMS (altered mental status)   Acute on chronic respiratory failure with hypoxia we will continue with the T-piece wean secretions are limiting [place] from being able to advance appropriate Severe sepsis treated in resolution we will continue to follow along closely Streptococcal pneumonia treated slow improvement Altered mental state supportive care no changes are noted Acute stroke supportive care again therapy as tolerated   I have personally seen and evaluated the patient, evaluated laboratory and imaging results, formulated the assessment and plan and placed orders. The Patient requires high complexity decision making with multiple systems involvement.  Rounds were done with the Respiratory Therapy Director and Staff therapists and discussed with nursing staff also.  Allyne Gee, MD [facility] Pulmonary Critical Care Medicine Sleep Medicine

## 2021-02-23 LAB — BASIC METABOLIC PANEL
Anion gap: 7 (ref 5–15)
BUN: 21 mg/dL (ref 8–23)
CO2: 27 mmol/L (ref 22–32)
Calcium: 9.8 mg/dL (ref 8.9–10.3)
Chloride: 105 mmol/L (ref 98–111)
Creatinine, Ser: 0.68 mg/dL (ref 0.44–1.00)
GFR, Estimated: >60 mL/min (ref >60)
Glucose, Bld: 207 mg/dL — ABNORMAL HIGH (ref 70–99)
Potassium: 4 mmol/L (ref 3.5–5.1)
Sodium: 139 mmol/L (ref 135–145)

## 2021-02-23 NOTE — Progress Notes (Signed)
Pulmonary [place]   PULMONARY CRITICAL CARE SERVICE  PROGRESS NOTE     Rhonda Winters  NID:[id number]  DOB: Jan 05, 1946   DOA: 02/12/2021  Referring Physician: Satira Sark, MD  HPI: Rhonda Winters is a 75 y.o. female being followed for ventilator/airway/oxygen weaning Acute on Chronic Respiratory Failure.  Patient is on T collar currently on 28% FiO2 secretions does remain significant requiring frequent suctioning.  Medications: Reviewed on Rounds  Physical Exam:  Vitals: Temperature is 98.8 pulse 71 respiratory 23 blood pressure is 158/80 saturations 100%  Ventilator Settings on T collar FiO2 28%  General: Comfortable at this time Neck: supple Cardiovascular: no malignant arrhythmias Respiratory: No rhonchi very coarse breath sounds Skin: no rash seen on limited exam Musculoskeletal: No gross abnormality Psychiatric:unable to assess Neurologic:no involuntary movements         Lab Data:   Basic Metabolic Panel: Recent Labs  Lab 02/18/21 0410 02/19/21 1143 02/23/21 0456  NA 137  --  139  K 2.8* 4.2 4.0  CL 95*  --  105  CO2 33*  --  27  GLUCOSE 225*  --  207*  BUN 15  --  21  CREATININE 0.67  --  0.68  CALCIUM 9.3  --  9.8  MG 1.8  --   --     ABG: No results for input(s): PHART, PCO2ART, PO2ART, HCO3, O2SAT in the last 168 hours.  Liver Function Tests: No results for input(s): AST, ALT, ALKPHOS, BILITOT, PROT, ALBUMIN in the last 168 hours. No results for input(s): LIPASE, AMYLASE in the last 168 hours. No results for input(s): AMMONIA in the last 168 hours.  CBC: Recent Labs  Lab 02/18/21 0410  WBC 8.6  HGB 8.3*  HCT 25.8*  MCV 100.0  PLT 435*    Cardiac Enzymes: No results for input(s): CKTOTAL, CKMB, CKMBINDEX, TROPONINI in the last 168 hours.  BNP (last 3 results) Recent Labs    01/30/21 0024  BNP 177.2*    ProBNP (last 3 results) No results for input(s): PROBNP in the last  8760 hours.  Radiological Exams: No results found.  Assessment/Plan Active Problems:   Cerebrovascular accident (CVA) due to occlusion of right posterior cerebral artery (HCC)   Sepsis ([place])   Streptococcal pneumonia ([place])   Acute on chronic respiratory failure with hypoxia (HCC)   AMS (altered mental status)   Acute on chronic respiratory failure hypoxia we will continue with aggressive pulmonary toilet secretions are the limiting factor as far as being able to wean further Severe sepsis resolved hemodynamics are stable Streptococcal pneumonia patient has been treated with antibiotics Altered mental status no change supportive care Acute stroke we will continue with supportive care   I have personally seen and evaluated the patient, evaluated laboratory and imaging results, formulated the assessment and plan and placed orders. The Patient requires high complexity decision making with multiple systems involvement.  Rounds were done with the Respiratory Therapy Director and Staff therapists and discussed with nursing staff also.  Allyne Gee, MD [facility] Pulmonary Critical Care Medicine Sleep Medicine

## 2021-02-24 ENCOUNTER — Other Ambulatory Visit (HOSPITAL_COMMUNITY): Payer: Self-pay

## 2021-02-24 LAB — URINALYSIS, ROUTINE W REFLEX MICROSCOPIC
Bilirubin Urine: NEGATIVE
Glucose, UA: NEGATIVE mg/dL
Hgb urine dipstick: NEGATIVE
Ketones, ur: NEGATIVE mg/dL
Nitrite: NEGATIVE
Protein, ur: NEGATIVE mg/dL
Specific Gravity, Urine: 1.018 (ref 1.005–1.030)
pH: 6 (ref 5.0–8.0)

## 2021-02-24 NOTE — Progress Notes (Signed)
Pulmonary [place]   PULMONARY CRITICAL CARE SERVICE  PROGRESS NOTE     Rhonda Winters  [id number]  DOB: 1945/04/20   DOA: 02/12/2021  Referring Physician: Satira Sark, MD  HPI: Rhonda Winters is a 75 y.o. female being followed for ventilator/airway/oxygen weaning Acute on Chronic Respiratory Failure.  Patient is on T collar on 28% FiO2 saturations are good.  Secretions are moderate  Medications: Reviewed on Rounds  Physical Exam:  Vitals: Temperature is 98.8 pulse 81 respiratory 23 blood pressure is 157/81 saturations 97%  Ventilator Settings of the ventilator on T collar FiO2 of 28%  General: Comfortable at this time Neck: supple Cardiovascular: no malignant arrhythmias Respiratory: No rhonchi no rales are noted at this time Skin: no rash seen on limited exam Musculoskeletal: No gross abnormality Psychiatric:unable to assess Neurologic:no involuntary movements         Lab Data:   Basic Metabolic Panel: Recent Labs  Lab 02/18/21 0410 02/19/21 1143 02/23/21 0456  NA 137  --  139  K 2.8* 4.2 4.0  CL 95*  --  105  CO2 33*  --  27  GLUCOSE 225*  --  207*  BUN 15  --  21  CREATININE 0.67  --  0.68  CALCIUM 9.3  --  9.8  MG 1.8  --   --     ABG: No results for input(s): PHART, PCO2ART, PO2ART, HCO3, O2SAT in the last 168 hours.  Liver Function Tests: No results for input(s): AST, ALT, ALKPHOS, BILITOT, PROT, ALBUMIN in the last 168 hours. No results for input(s): LIPASE, AMYLASE in the last 168 hours. No results for input(s): AMMONIA in the last 168 hours.  CBC: Recent Labs  Lab 02/18/21 0410  WBC 8.6  HGB 8.3*  HCT 25.8*  MCV 100.0  PLT 435*    Cardiac Enzymes: No results for input(s): CKTOTAL, CKMB, CKMBINDEX, TROPONINI in the last 168 hours.  BNP (last 3 results) Recent Labs    01/30/21 0024  BNP 177.2*    ProBNP (last 3 results) No results for input(s): PROBNP in the last 8760  hours.  Radiological Exams: No results found.  Assessment/Plan Active Problems:   Cerebrovascular accident (CVA) due to occlusion of right posterior cerebral artery (HCC)   Sepsis ([place])   Streptococcal pneumonia ([place])   Acute on chronic respiratory failure with hypoxia (HCC)   AMS (altered mental status)   Acute on chronic respiratory failure hypoxia plan is to continue with the T-piece continue to manage secretions continue with pulmonary toilet supportive care. Severe sepsis resolved hemodynamics are stable Streptococcal pneumonia has been treated with antibiotics Altered mental status supportive care Acute stroke supportive care we will continue to follow along   I have personally seen and evaluated the patient, evaluated laboratory and imaging results, formulated the assessment and plan and placed orders. The Patient requires high complexity decision making with multiple systems involvement.  Rounds were done with the Respiratory Therapy Director and Staff therapists and discussed with nursing staff also.  Allyne Gee, MD [facility] Pulmonary Critical Care Medicine Sleep Medicine

## 2021-02-25 LAB — CBC WITH DIFFERENTIAL/PLATELET
Abs Immature Granulocytes: 0.05 10*3/uL (ref 0.00–0.07)
Basophils Absolute: 0 10*3/uL (ref 0.0–0.1)
Basophils Relative: 0 %
Eosinophils Absolute: 0.2 10*3/uL (ref 0.0–0.5)
Eosinophils Relative: 2 %
HCT: 25.1 % — ABNORMAL LOW (ref 36.0–46.0)
Hemoglobin: 7.8 g/dL — ABNORMAL LOW (ref 12.0–15.0)
Immature Granulocytes: 0 %
Lymphocytes Relative: 12 %
Lymphs Abs: 1.5 10*3/uL (ref 0.7–4.0)
MCH: 31.2 pg (ref 26.0–34.0)
MCHC: 31.1 g/dL (ref 30.0–36.0)
MCV: 100.4 fL — ABNORMAL HIGH (ref 80.0–100.0)
Monocytes Absolute: 1 10*3/uL (ref 0.1–1.0)
Monocytes Relative: 9 %
Neutro Abs: 9 10*3/uL — ABNORMAL HIGH (ref 1.7–7.7)
Neutrophils Relative %: 77 %
Platelets: 290 10*3/uL (ref 150–400)
RBC: 2.5 MIL/uL — ABNORMAL LOW (ref 3.87–5.11)
RDW: 14.1 % (ref 11.5–15.5)
WBC: 11.8 10*3/uL — ABNORMAL HIGH (ref 4.0–10.5)
nRBC: 0 % (ref 0.0–0.2)

## 2021-02-25 NOTE — Progress Notes (Signed)
Pulmonary [place]   PULMONARY CRITICAL CARE SERVICE  PROGRESS NOTE     Rhonda Winters  OZH:[id number]  DOB: 1945/06/01   DOA: 02/12/2021  Referring Physician: Satira Sark, MD  HPI: Rhonda Winters is a 75 y.o. female being followed for ventilator/airway/oxygen weaning Acute on Chronic Respiratory Failure.  Patient is on T collar at this time on 28% FiO2 secretions are still quite copious  Medications: Reviewed on Rounds  Physical Exam:  Vitals: Temperature is 100.0 pulse 67 respiratory 23 blood pressure is 139/79 saturations 96%  Ventilator Settings off the ventilator on T collar FiO2 28%  General: Comfortable at this time Neck: supple Cardiovascular: no malignant arrhythmias Respiratory: Scattered rhonchi expansion is equal Skin: no rash seen on limited exam Musculoskeletal: No gross abnormality Psychiatric:unable to assess Neurologic:no involuntary movements         Lab Data:   Basic Metabolic Panel: Recent Labs  Lab 02/19/21 1143 02/23/21 0456  NA  --  139  K 4.2 4.0  CL  --  105  CO2  --  27  GLUCOSE  --  207*  BUN  --  21  CREATININE  --  0.68  CALCIUM  --  9.8    ABG: No results for input(s): PHART, PCO2ART, PO2ART, HCO3, O2SAT in the last 168 hours.  Liver Function Tests: No results for input(s): AST, ALT, ALKPHOS, BILITOT, PROT, ALBUMIN in the last 168 hours. No results for input(s): LIPASE, AMYLASE in the last 168 hours. No results for input(s): AMMONIA in the last 168 hours.  CBC: No results for input(s): WBC, NEUTROABS, HGB, HCT, MCV, PLT in the last 168 hours.  Cardiac Enzymes: No results for input(s): CKTOTAL, CKMB, CKMBINDEX, TROPONINI in the last 168 hours.  BNP (last 3 results) Recent Labs    01/30/21 0024  BNP 177.2*    ProBNP (last 3 results) No results for input(s): PROBNP in the last 8760 hours.  Radiological Exams: DG Chest Port 1 View  Result Date:  02/24/2021 CLINICAL DATA:  Respiratory failure EXAM: PORTABLE CHEST 1 VIEW COMPARISON:  Chest x-ray dated February 19, 2021 FINDINGS: Evaluation is limited due to patient rotation. Support devices are grossly unchanged. Limited evaluation of the cardiac and mediastinal contours due to patient rotation. Bilateral heterogeneous opacities with more focal consolidation of the right upper lobe, grossly similar to prior exams. No definite pleural effusion or pneumothorax. IMPRESSION: Evaluation is limited due to patient rotation. Within limitations, findings are grossly stable. Electronically Signed   By: Yetta Glassman M.D.   On: 02/24/2021 13:37    Assessment/Plan Active Problems:   Cerebrovascular accident (CVA) due to occlusion of right posterior cerebral artery (HCC)   Sepsis ([place])   Streptococcal pneumonia ([place])   Acute on chronic respiratory failure with hypoxia ([place])   AMS (altered mental status)   Acute on chronic respiratory failure hypoxia patient will continue with the T-piece patient has some issues as far as being able to wean because of the excessive secretions. Severe sepsis resolved hemodynamics are stable at this time. Streptococcal pneumonia treated we will continue to follow along closely Acute stroke therapy as tolerated supportive care Altered mental state patient is showing slow signs of improvement   I have personally seen and evaluated the patient, evaluated laboratory and imaging results, formulated the assessment and plan and placed orders. The Patient requires high complexity decision making with multiple systems involvement.  Rounds were done with the Respiratory Therapy Director and Staff  therapists and discussed with nursing staff also.  Allyne Gee, MD [facility] Pulmonary Critical Care Medicine Sleep Medicine

## 2021-02-26 NOTE — Progress Notes (Signed)
Pulmonary [place]   PULMONARY CRITICAL CARE SERVICE  PROGRESS NOTE     SYNDEY JASKOLSKI  OVF:[id number]  DOB: 02-12-1946   DOA: 02/12/2021  Referring Physician: Satira Sark, MD  HPI: ASAKO SALIBA is a 75 y.o. female being followed for ventilator/airway/oxygen weaning Acute on Chronic Respiratory Failure.  Patient is afebrile resting comfortably right now without distress remains on T collar secretions are copious  Medications: Reviewed on Rounds  Physical Exam:  Vitals: Temperature is 97.4 pulse 78 respiratory 16 blood pressure is 136/74 saturations 100%  Ventilator Settings on T collar off the ventilator  General: Comfortable at this time Neck: supple Cardiovascular: no malignant arrhythmias Respiratory: Scattered rhonchi expansion is equal Skin: no rash seen on limited exam Musculoskeletal: No gross abnormality Psychiatric:unable to assess Neurologic:no involuntary movements         Lab Data:   Basic Metabolic Panel: Recent Labs  Lab 02/19/21 1143 02/23/21 0456  NA  --  139  K 4.2 4.0  CL  --  105  CO2  --  27  GLUCOSE  --  207*  BUN  --  21  CREATININE  --  0.68  CALCIUM  --  9.8    ABG: No results for input(s): PHART, PCO2ART, PO2ART, HCO3, O2SAT in the last 168 hours.  Liver Function Tests: No results for input(s): AST, ALT, ALKPHOS, BILITOT, PROT, ALBUMIN in the last 168 hours. No results for input(s): LIPASE, AMYLASE in the last 168 hours. No results for input(s): AMMONIA in the last 168 hours.  CBC: Recent Labs  Lab 02/25/21 1641  WBC 11.8*  NEUTROABS 9.0*  HGB 7.8*  HCT 25.1*  MCV 100.4*  PLT 290    Cardiac Enzymes: No results for input(s): CKTOTAL, CKMB, CKMBINDEX, TROPONINI in the last 168 hours.  BNP (last 3 results) Recent Labs    01/30/21 0024  BNP 177.2*    ProBNP (last 3 results) No results for input(s): PROBNP in the last 8760 hours.  Radiological Exams: DG  Chest Port 1 View  Result Date: 02/24/2021 CLINICAL DATA:  Respiratory failure EXAM: PORTABLE CHEST 1 VIEW COMPARISON:  Chest x-ray dated February 19, 2021 FINDINGS: Evaluation is limited due to patient rotation. Support devices are grossly unchanged. Limited evaluation of the cardiac and mediastinal contours due to patient rotation. Bilateral heterogeneous opacities with more focal consolidation of the right upper lobe, grossly similar to prior exams. No definite pleural effusion or pneumothorax. IMPRESSION: Evaluation is limited due to patient rotation. Within limitations, findings are grossly stable. Electronically Signed   By: Yetta Glassman M.D.   On: 02/24/2021 13:37    Assessment/Plan Active Problems:   Cerebrovascular accident (CVA) due to occlusion of right posterior cerebral artery (HCC)   Sepsis ([place])   Streptococcal pneumonia ([place])   Acute on chronic respiratory failure with hypoxia (HCC)   AMS (altered mental status)   Acute on chronic respiratory failure hypoxia we will continue with the T-piece titrate oxygen as tolerated continue pulmonary toilet. Severe sepsis treated resolved hemodynamics are stable. Streptococcal pneumonia treated continue to follow-up on x-rays Altered mental status supportive care no change noted overall remains confused Acute stroke therapy as tolerated   I have personally seen and evaluated the patient, evaluated laboratory and imaging results, formulated the assessment and plan and placed orders. The Patient requires high complexity decision making with multiple systems involvement.  Rounds were done with the Respiratory Therapy Director and Staff therapists and discussed with nursing  staff also.  Allyne Gee, MD [facility] Pulmonary Critical Care Medicine Sleep Medicine

## 2021-02-27 LAB — CULTURE, RESPIRATORY W GRAM STAIN

## 2021-02-28 NOTE — Progress Notes (Signed)
Pulmonary [place]   PULMONARY CRITICAL CARE SERVICE  PROGRESS NOTE     Rhonda Winters  [id number]  DOB: 1945-10-11   DOA: 02/12/2021  Referring Physician: Satira Sark, MD  HPI: Rhonda Winters is a 76 y.o. female being followed for ventilator/airway/oxygen weaning Acute on Chronic Respiratory Failure.  Patient is on T collar currently on 30% FiO2  Medications: Reviewed on Rounds  Physical Exam:  Vitals: Temperature is 98.6 pulse 69 respiratory is 13 blood pressure is 153/87 saturations 93%  Ventilator Settings off the ventilator on T collar  General: Comfortable at this time Neck: supple Cardiovascular: no malignant arrhythmias Respiratory: No rhonchi very coarse breath sounds Skin: no rash seen on limited exam Musculoskeletal: No gross abnormality Psychiatric:unable to assess Neurologic:no involuntary movements         Lab Data:   Basic Metabolic Panel: Recent Labs  Lab 02/23/21 0456  NA 139  K 4.0  CL 105  CO2 27  GLUCOSE 207*  BUN 21  CREATININE 0.68  CALCIUM 9.8    ABG: No results for input(s): PHART, PCO2ART, PO2ART, HCO3, O2SAT in the last 168 hours.  Liver Function Tests: No results for input(s): AST, ALT, ALKPHOS, BILITOT, PROT, ALBUMIN in the last 168 hours. No results for input(s): LIPASE, AMYLASE in the last 168 hours. No results for input(s): AMMONIA in the last 168 hours.  CBC: Recent Labs  Lab 02/25/21 1641  WBC 11.8*  NEUTROABS 9.0*  HGB 7.8*  HCT 25.1*  MCV 100.4*  PLT 290    Cardiac Enzymes: No results for input(s): CKTOTAL, CKMB, CKMBINDEX, TROPONINI in the last 168 hours.  BNP (last 3 results) Recent Labs    01/30/21 0024  BNP 177.2*    ProBNP (last 3 results) No results for input(s): PROBNP in the last 8760 hours.  Radiological Exams: No results found.  Assessment/Plan Active Problems:   Cerebrovascular accident (CVA) due to occlusion of right posterior  cerebral artery (HCC)   Sepsis ([place])   Streptococcal pneumonia ([place])   Acute on chronic respiratory failure with hypoxia (HCC)   AMS (altered mental status)   Acute on chronic respiratory failure with hypoxia plan continue with T collar on 28% FiO2 continue secretion management pulmonary toilet. Stroke supportive care therapy as tolerated Streptococcal pneumonia has been treated with antibiotics Altered mental status no change Sepsis supportive care has been treated hemodynamics are stable   I have personally seen and evaluated the patient, evaluated laboratory and imaging results, formulated the assessment and plan and placed orders. The Patient requires high complexity decision making with multiple systems involvement.  Rounds were done with the Respiratory Therapy Director and Staff therapists and discussed with nursing staff also.  Allyne Gee, MD [facility] Pulmonary Critical Care Medicine Sleep Medicine

## 2021-03-01 LAB — CULTURE, BLOOD (ROUTINE X 2)
Culture: NO GROWTH
Culture: NO GROWTH
Special Requests: ADEQUATE
Special Requests: ADEQUATE

## 2021-03-01 LAB — COMPREHENSIVE METABOLIC PANEL
ALT: 24 U/L (ref 0–44)
AST: 17 U/L (ref 15–41)
Albumin: 2 g/dL — ABNORMAL LOW (ref 3.5–5.0)
Alkaline Phosphatase: 74 U/L (ref 38–126)
Anion gap: 10 (ref 5–15)
BUN: 19 mg/dL (ref 8–23)
CO2: 30 mmol/L (ref 22–32)
Calcium: 9.8 mg/dL (ref 8.9–10.3)
Chloride: 99 mmol/L (ref 98–111)
Creatinine, Ser: 0.63 mg/dL (ref 0.44–1.00)
GFR, Estimated: >60 mL/min (ref >60)
Glucose, Bld: 176 mg/dL — ABNORMAL HIGH (ref 70–99)
Potassium: 3.6 mmol/L (ref 3.5–5.1)
Sodium: 139 mmol/L (ref 135–145)
Total Bilirubin: 0.5 mg/dL (ref 0.3–1.2)
Total Protein: 6.1 g/dL — ABNORMAL LOW (ref 6.5–8.1)

## 2021-03-01 LAB — CBC
HCT: 25.5 % — ABNORMAL LOW (ref 36.0–46.0)
Hemoglobin: 7.9 g/dL — ABNORMAL LOW (ref 12.0–15.0)
MCH: 30.5 pg (ref 26.0–34.0)
MCHC: 31 g/dL (ref 30.0–36.0)
MCV: 98.5 fL (ref 80.0–100.0)
Platelets: 272 10*3/uL (ref 150–400)
RBC: 2.59 MIL/uL — ABNORMAL LOW (ref 3.87–5.11)
RDW: 14 % (ref 11.5–15.5)
WBC: 8.5 10*3/uL (ref 4.0–10.5)
nRBC: 0 % (ref 0.0–0.2)

## 2021-03-01 NOTE — Progress Notes (Signed)
Pulmonary [place]   PULMONARY CRITICAL CARE SERVICE  PROGRESS NOTE     Rhonda Winters  ELF:[id number]  DOB: 15-Sep-1945   DOA: 02/12/2021  Referring Physician: Satira Sark, MD  HPI: Rhonda Winters is a 76 y.o. female being followed for ventilator/airway/oxygen weaning Acute on Chronic Respiratory Failure.  Patient is off the ventilator on T collar comfortable without distress has a cuffless trach and is still with copious secretions.  Medications: Reviewed on Rounds  Physical Exam:  Vitals: Temperature is 98.4 pulse 67 respiratory 20 blood pressure is 139/86 saturations 97%  Ventilator Settings on T collar FiO2 28%  General: Comfortable at this time Neck: supple Cardiovascular: no malignant arrhythmias Respiratory: Scattered rhonchi expansion is equal Skin: no rash seen on limited exam Musculoskeletal: No gross abnormality Psychiatric:unable to assess Neurologic:no involuntary movements         Lab Data:   Basic Metabolic Panel: Recent Labs  Lab 02/23/21 0456  NA 139  K 4.0  CL 105  CO2 27  GLUCOSE 207*  BUN 21  CREATININE 0.68  CALCIUM 9.8    ABG: No results for input(s): PHART, PCO2ART, PO2ART, HCO3, O2SAT in the last 168 hours.  Liver Function Tests: No results for input(s): AST, ALT, ALKPHOS, BILITOT, PROT, ALBUMIN in the last 168 hours. No results for input(s): LIPASE, AMYLASE in the last 168 hours. No results for input(s): AMMONIA in the last 168 hours.  CBC: Recent Labs  Lab 02/25/21 1641 03/01/21 0636  WBC 11.8* 8.5  NEUTROABS 9.0*  --   HGB 7.8* 7.9*  HCT 25.1* 25.5*  MCV 100.4* 98.5  PLT 290 272    Cardiac Enzymes: No results for input(s): CKTOTAL, CKMB, CKMBINDEX, TROPONINI in the last 168 hours.  BNP (last 3 results) Recent Labs    01/30/21 0024  BNP 177.2*    ProBNP (last 3 results) No results for input(s): PROBNP in the last 8760 hours.  Radiological Exams: No  results found.  Assessment/Plan Active Problems:   Cerebrovascular accident (CVA) due to occlusion of right posterior cerebral artery (HCC)   Sepsis ([place])   Streptococcal pneumonia ([place])   Acute on chronic respiratory failure with hypoxia (HCC)   AMS (altered mental status)   Acute on chronic respiratory failure with hypoxia we will continue with the T-piece titrate oxygen as tolerated continue secretion management pulmonary toilet.  Patient appears to be at baseline Severe sepsis treated resolved hemodynamics are stable at this time. Streptococcal pneumonia treated slow improvement Altered mental state no change Acute stroke patient is at baseline   I have personally seen and evaluated the patient, evaluated laboratory and imaging results, formulated the assessment and plan and placed orders. The Patient requires high complexity decision making with multiple systems involvement.  Rounds were done with the Respiratory Therapy Director and Staff therapists and discussed with nursing staff also.  Allyne Gee, MD [facility] Pulmonary Critical Care Medicine Sleep Medicine

## 2021-03-02 LAB — MAGNESIUM: Magnesium: 1.8 mg/dL (ref 1.7–2.4)

## 2021-03-02 LAB — CBC
HCT: 25.8 % — ABNORMAL LOW (ref 36.0–46.0)
Hemoglobin: 7.8 g/dL — ABNORMAL LOW (ref 12.0–15.0)
MCH: 30.1 pg (ref 26.0–34.0)
MCHC: 30.2 g/dL (ref 30.0–36.0)
MCV: 99.6 fL (ref 80.0–100.0)
Platelets: 288 10*3/uL (ref 150–400)
RBC: 2.59 MIL/uL — ABNORMAL LOW (ref 3.87–5.11)
RDW: 14.2 % (ref 11.5–15.5)
WBC: 7.9 10*3/uL (ref 4.0–10.5)
nRBC: 0 % (ref 0.0–0.2)

## 2021-03-02 LAB — BASIC METABOLIC PANEL
Anion gap: 8 (ref 5–15)
BUN: 19 mg/dL (ref 8–23)
CO2: 30 mmol/L (ref 22–32)
Calcium: 9.7 mg/dL (ref 8.9–10.3)
Chloride: 100 mmol/L (ref 98–111)
Creatinine, Ser: 0.62 mg/dL (ref 0.44–1.00)
GFR, Estimated: >60 mL/min (ref >60)
Glucose, Bld: 189 mg/dL — ABNORMAL HIGH (ref 70–99)
Potassium: 3.6 mmol/L (ref 3.5–5.1)
Sodium: 138 mmol/L (ref 135–145)

## 2021-03-02 LAB — TSH: TSH: 2.025 u[IU]/mL (ref 0.350–4.500)

## 2021-03-02 LAB — PHOSPHORUS: Phosphorus: 3.3 mg/dL (ref 2.5–4.6)

## 2021-03-02 LAB — T4, FREE: Free T4: 1.7 ng/dL — ABNORMAL HIGH (ref 0.61–1.12)

## 2021-03-02 NOTE — Progress Notes (Signed)
Pulmonary [place]   PULMONARY CRITICAL CARE SERVICE  PROGRESS NOTE     LORRAIN RIVERS  CZY:[id number]  DOB: 1946-02-16   DOA: 02/12/2021  Referring Physician: Satira Sark, MD  HPI: Rhonda Winters is a 76 y.o. female being followed for ventilator/airway/oxygen weaning Acute on Chronic Respiratory Failure.  Patient is on T collar on 28% FiO2 saturations are good.  Secretions are moderate patient required frequent suctioning  Medications: Reviewed on Rounds  Physical Exam:  Vitals: Temperature 98.0 pulse 66 respiratory rate 17 blood pressure is 141/75 saturations 100%  Ventilator Settings scattered rhonchi very coarse breath sounds  General: Comfortable at this time Neck: supple Cardiovascular: no malignant arrhythmias Respiratory: No rhonchi no rales are noted at this time. Skin: no rash seen on limited exam Musculoskeletal: No gross abnormality Psychiatric:unable to assess Neurologic:no involuntary movements         Lab Data:   Basic Metabolic Panel: Recent Labs  Lab 03/01/21 0636 03/02/21 0407  NA 139 138  K 3.6 3.6  CL 99 100  CO2 30 30  GLUCOSE 176* 189*  BUN 19 19  CREATININE 0.63 0.62  CALCIUM 9.8 9.7  MG  --  1.8  PHOS  --  3.3    ABG: No results for input(s): PHART, PCO2ART, PO2ART, HCO3, O2SAT in the last 168 hours.  Liver Function Tests: Recent Labs  Lab 03/01/21 0636  AST 17  ALT 24  ALKPHOS 74  BILITOT 0.5  PROT 6.1*  ALBUMIN 2.0*   No results for input(s): LIPASE, AMYLASE in the last 168 hours. No results for input(s): AMMONIA in the last 168 hours.  CBC: Recent Labs  Lab 02/25/21 1641 03/01/21 0636 03/02/21 0407  WBC 11.8* 8.5 7.9  NEUTROABS 9.0*  --   --   HGB 7.8* 7.9* 7.8*  HCT 25.1* 25.5* 25.8*  MCV 100.4* 98.5 99.6  PLT 290 272 288    Cardiac Enzymes: No results for input(s): CKTOTAL, CKMB, CKMBINDEX, TROPONINI in the last 168 hours.  BNP (last 3  results) Recent Labs    01/30/21 0024  BNP 177.2*    ProBNP (last 3 results) No results for input(s): PROBNP in the last 8760 hours.  Radiological Exams: No results found.  Assessment/Plan Active Problems:   Cerebrovascular accident (CVA) due to occlusion of right posterior cerebral artery (HCC)   Sepsis ([place])   Streptococcal pneumonia ([place])   Acute on chronic respiratory failure with hypoxia (HCC)   AMS (altered mental status)   Acute on chronic respiratory failure with hypoxia patient is off the ventilator on T collar [place] on 28% FiO2 plan is going to be to continue with T-piece titrate oxygen as tolerated continue secretion management pulmonary toilet Severe sepsis resolved hemodynamics are stable at this time. Streptococcal pneumonia supportive care we will continue to follow along Altered mental state no change Acute stroke supportive care therapy as tolerated   I have personally seen and evaluated the patient, evaluated laboratory and imaging results, formulated the assessment and plan and placed orders. The Patient requires high complexity decision making with multiple systems involvement.  Rounds were done with the Respiratory Therapy Director and Staff therapists and discussed with nursing staff also.  Allyne Gee, MD [facility] Pulmonary Critical Care Medicine Sleep Medicine

## 2021-03-03 NOTE — Progress Notes (Signed)
Pulmonary [place]   PULMONARY CRITICAL CARE SERVICE  PROGRESS NOTE     Rhonda Winters  RNH:[id number]  DOB: [birth year]   DOA: 02/12/2021  Referring Physician: Satira Sark, MD  HPI: Rhonda Winters is a 76 y.o. female being followed for ventilator/airway/oxygen weaning Acute on Chronic Respiratory Failure.  Patient is off the ventilator still with copious secretions on T collar remains on 28% FiO2  Medications: Reviewed on Rounds  Physical Exam:  Vitals: Temperature 97.8 pulse 63 respiratory 16 blood pressure is 129/73 saturations 97%  Ventilator Settings on T collar FiO2 is 28% copious secretions  General: Comfortable at this time Neck: supple Cardiovascular: no malignant arrhythmias Respiratory: Scattered rhonchi very coarse breath sounds Skin: no rash seen on limited exam Musculoskeletal: No gross abnormality Psychiatric:unable to assess Neurologic:no involuntary movements         Lab Data:   Basic Metabolic Panel: Recent Labs  Lab 03/01/21 0636 03/02/21 0407  NA 139 138  K 3.6 3.6  CL 99 100  CO2 30 30  GLUCOSE 176* 189*  BUN 19 19  CREATININE 0.63 0.62  CALCIUM 9.8 9.7  MG  --  1.8  PHOS  --  3.3    ABG: No results for input(s): PHART, PCO2ART, PO2ART, HCO3, O2SAT in the last 168 hours.  Liver Function Tests: Recent Labs  Lab 03/01/21 0636  AST 17  ALT 24  ALKPHOS 74  BILITOT 0.5  PROT 6.1*  ALBUMIN 2.0*   No results for input(s): LIPASE, AMYLASE in the last 168 hours. No results for input(s): AMMONIA in the last 168 hours.  CBC: Recent Labs  Lab 02/25/21 1641 03/01/21 0636 03/02/21 0407  WBC 11.8* 8.5 7.9  NEUTROABS 9.0*  --   --   HGB 7.8* 7.9* 7.8*  HCT 25.1* 25.5* 25.8*  MCV 100.4* 98.5 99.6  PLT 290 272 288    Cardiac Enzymes: No results for input(s): CKTOTAL, CKMB, CKMBINDEX, TROPONINI in the last 168 hours.  BNP (last 3 results) Recent Labs    01/30/21 0024  BNP  177.2*    ProBNP (last 3 results) No results for input(s): PROBNP in the last 8760 hours.  Radiological Exams: No results found.  Assessment/Plan Active Problems:   Cerebrovascular accident (CVA) due to occlusion of right posterior cerebral artery (HCC)   Sepsis ([place])   Streptococcal pneumonia ([place])   Acute on chronic respiratory failure with hypoxia (HCC)   AMS (altered mental status)   Acute on chronic respiratory failure with hypoxia limiting factor is excessive secretions as far as being able to wean.  We will continue OT collar 28% FiO2 Severe sepsis resolved hemodynamics are stable Streptococcal pneumonia treated slowly improving we will continue to monitor Acute stroke supportive care Altered mental state slowly improving   I have personally seen and evaluated the patient, evaluated laboratory and imaging results, formulated the assessment and plan and placed orders. The Patient requires high complexity decision making with multiple systems involvement.  Rounds were done with the Respiratory Therapy Director and Staff therapists and discussed with nursing staff also.  Allyne Gee, MD [facility] Pulmonary Critical Care Medicine Sleep Medicine

## 2021-03-04 ENCOUNTER — Other Ambulatory Visit (HOSPITAL_COMMUNITY): Payer: Self-pay

## 2021-03-04 LAB — CBC
HCT: 24.6 % — ABNORMAL LOW (ref 36.0–46.0)
Hemoglobin: 7.6 g/dL — ABNORMAL LOW (ref 12.0–15.0)
MCH: 30.4 pg (ref 26.0–34.0)
MCHC: 30.9 g/dL (ref 30.0–36.0)
MCV: 98.4 fL (ref 80.0–100.0)
Platelets: 278 10*3/uL (ref 150–400)
RBC: 2.5 MIL/uL — ABNORMAL LOW (ref 3.87–5.11)
RDW: 14.6 % (ref 11.5–15.5)
WBC: 7 10*3/uL (ref 4.0–10.5)
nRBC: 0 % (ref 0.0–0.2)

## 2021-03-04 LAB — BASIC METABOLIC PANEL
Anion gap: 5 (ref 5–15)
BUN: 20 mg/dL (ref 8–23)
CO2: 27 mmol/L (ref 22–32)
Calcium: 9.8 mg/dL (ref 8.9–10.3)
Chloride: 103 mmol/L (ref 98–111)
Creatinine, Ser: 0.68 mg/dL (ref 0.44–1.00)
GFR, Estimated: >60 mL/min (ref >60)
Glucose, Bld: 189 mg/dL — ABNORMAL HIGH (ref 70–99)
Potassium: 3.8 mmol/L (ref 3.5–5.1)
Sodium: 135 mmol/L (ref 135–145)

## 2021-03-04 LAB — PHOSPHORUS: Phosphorus: 3.4 mg/dL (ref 2.5–4.6)

## 2021-03-04 LAB — MAGNESIUM: Magnesium: 1.7 mg/dL (ref 1.7–2.4)

## 2021-03-04 NOTE — Progress Notes (Signed)
Pulmonary [place]   PULMONARY CRITICAL CARE SERVICE  PROGRESS NOTE     LETZY GULLICKSON  GBT:[id number]  DOB: 12-15-1945   DOA: 02/12/2021  Referring Physician: Satira Sark, MD  HPI: GUSTIE BOBB is a 76 y.o. female being followed for ventilator/airway/oxygen weaning Acute on Chronic Respiratory Failure.  Patient is on T collar 28% FiO2 has been doing very well ready for capping we will  Medications: Reviewed on Rounds  Physical Exam:  Vitals: Temperature is 98.2 pulse 62 respiratory 22 blood pressure is 131/71 saturations 97%  Ventilator Settings capping will be started right now is on T collar 28%  General: Comfortable at this time Neck: supple Cardiovascular: no malignant arrhythmias Respiratory: No rhonchi no rales are noted at this time Skin: no rash seen on limited exam Musculoskeletal: No gross abnormality Psychiatric:unable to assess Neurologic:no involuntary movements         Lab Data:   Basic Metabolic Panel: Recent Labs  Lab 03/01/21 0636 03/02/21 0407 03/04/21 0357  NA 139 138 135  K 3.6 3.6 3.8  CL 99 100 103  CO2 30 30 27   GLUCOSE 176* 189* 189*  BUN 19 19 20   CREATININE 0.63 0.62 0.68  CALCIUM 9.8 9.7 9.8  MG  --  1.8 1.7  PHOS  --  3.3 3.4    ABG: No results for input(s): PHART, PCO2ART, PO2ART, HCO3, O2SAT in the last 168 hours.  Liver Function Tests: Recent Labs  Lab 03/01/21 0636  AST 17  ALT 24  ALKPHOS 74  BILITOT 0.5  PROT 6.1*  ALBUMIN 2.0*   No results for input(s): LIPASE, AMYLASE in the last 168 hours. No results for input(s): AMMONIA in the last 168 hours.  CBC: Recent Labs  Lab 02/25/21 1641 03/01/21 0636 03/02/21 0407 03/04/21 0357  WBC 11.8* 8.5 7.9 7.0  NEUTROABS 9.0*  --   --   --   HGB 7.8* 7.9* 7.8* 7.6*  HCT 25.1* 25.5* 25.8* 24.6*  MCV 100.4* 98.5 99.6 98.4  PLT 290 272 288 278    Cardiac Enzymes: No results for input(s): CKTOTAL, CKMB,  CKMBINDEX, TROPONINI in the last 168 hours.  BNP (last 3 results) Recent Labs    01/30/21 0024  BNP 177.2*    ProBNP (last 3 results) No results for input(s): PROBNP in the last 8760 hours.  Radiological Exams: No results found.  Assessment/Plan Active Problems:   Cerebrovascular accident (CVA) due to occlusion of right posterior cerebral artery (HCC)   Sepsis ([place])   Streptococcal pneumonia ([place])   Acute on chronic respiratory failure with hypoxia (HCC)   AMS (altered mental status)   Acute on chronic respiratory failure hypoxia we will continue with the wean try capping as noted above Sepsis resolved hemodynamics are stable Streptococcal pneumonia also improving we will continue to follow along Altered mental state no change supportive care Acute stroke supportive care   I have personally seen and evaluated the patient, evaluated laboratory and imaging results, formulated the assessment and plan and placed orders. The Patient requires high complexity decision making with multiple systems involvement.  Rounds were done with the Respiratory Therapy Director and Staff therapists and discussed with nursing staff also.  Allyne Gee, MD [facility] Pulmonary Critical Care Medicine Sleep Medicine

## 2021-03-05 LAB — CBC
HCT: 26.5 % — ABNORMAL LOW (ref 36.0–46.0)
Hemoglobin: 8.1 g/dL — ABNORMAL LOW (ref 12.0–15.0)
MCH: 30 pg (ref 26.0–34.0)
MCHC: 30.6 g/dL (ref 30.0–36.0)
MCV: 98.1 fL (ref 80.0–100.0)
Platelets: 290 10*3/uL (ref 150–400)
RBC: 2.7 MIL/uL — ABNORMAL LOW (ref 3.87–5.11)
RDW: 14.6 % (ref 11.5–15.5)
WBC: 7.7 10*3/uL (ref 4.0–10.5)
nRBC: 0 % (ref 0.0–0.2)

## 2021-03-05 LAB — MAGNESIUM: Magnesium: 2 mg/dL (ref 1.7–2.4)

## 2021-03-16 ENCOUNTER — Encounter (HOSPITAL_COMMUNITY): Payer: Self-pay | Admitting: Radiology

## 2021-04-06 ENCOUNTER — Emergency Department (HOSPITAL_COMMUNITY): Payer: Medicare Other

## 2021-04-06 ENCOUNTER — Other Ambulatory Visit: Payer: Self-pay

## 2021-04-06 ENCOUNTER — Inpatient Hospital Stay (HOSPITAL_COMMUNITY)
Admission: EM | Admit: 2021-04-06 | Discharge: 2021-04-09 | DRG: 880 | Disposition: A | Discharge status: Home Health | Payer: Medicare Other | Attending: Internal Medicine | Admitting: Internal Medicine

## 2021-04-06 DIAGNOSIS — Z7901 Long term (current) use of anticoagulants: Secondary | ICD-10-CM

## 2021-04-06 DIAGNOSIS — I10 Essential (primary) hypertension: Secondary | ICD-10-CM | POA: Diagnosis present

## 2021-04-06 DIAGNOSIS — N3000 Acute cystitis without hematuria: Secondary | ICD-10-CM | POA: Diagnosis present

## 2021-04-06 DIAGNOSIS — R9431 Abnormal electrocardiogram [ECG] [EKG]: Secondary | ICD-10-CM | POA: Diagnosis present

## 2021-04-06 DIAGNOSIS — G9340 Encephalopathy, unspecified: Secondary | ICD-10-CM

## 2021-04-06 DIAGNOSIS — R443 Hallucinations, unspecified: Secondary | ICD-10-CM | POA: Diagnosis not present

## 2021-04-06 DIAGNOSIS — G500 Trigeminal neuralgia: Secondary | ICD-10-CM | POA: Diagnosis present

## 2021-04-06 DIAGNOSIS — I4891 Unspecified atrial fibrillation: Secondary | ICD-10-CM | POA: Diagnosis present

## 2021-04-06 DIAGNOSIS — Z79899 Other long term (current) drug therapy: Secondary | ICD-10-CM

## 2021-04-06 DIAGNOSIS — D649 Anemia, unspecified: Secondary | ICD-10-CM | POA: Diagnosis present

## 2021-04-06 DIAGNOSIS — B961 Klebsiella pneumoniae [K. pneumoniae] as the cause of diseases classified elsewhere: Secondary | ICD-10-CM | POA: Diagnosis present

## 2021-04-06 DIAGNOSIS — R531 Weakness: Secondary | ICD-10-CM

## 2021-04-06 DIAGNOSIS — R4182 Altered mental status, unspecified: Principal | ICD-10-CM

## 2021-04-06 DIAGNOSIS — E669 Obesity, unspecified: Secondary | ICD-10-CM | POA: Diagnosis present

## 2021-04-06 DIAGNOSIS — Z87891 Personal history of nicotine dependence: Secondary | ICD-10-CM

## 2021-04-06 DIAGNOSIS — Z79891 Long term (current) use of opiate analgesic: Secondary | ICD-10-CM

## 2021-04-06 DIAGNOSIS — Z8673 Personal history of transient ischemic attack (TIA), and cerebral infarction without residual deficits: Secondary | ICD-10-CM

## 2021-04-06 DIAGNOSIS — Z20822 Contact with and (suspected) exposure to covid-19: Secondary | ICD-10-CM | POA: Diagnosis present

## 2021-04-06 DIAGNOSIS — N390 Urinary tract infection, site not specified: Secondary | ICD-10-CM | POA: Diagnosis present

## 2021-04-06 DIAGNOSIS — Z885 Allergy status to narcotic agent status: Secondary | ICD-10-CM

## 2021-04-06 DIAGNOSIS — Z7984 Long term (current) use of oral hypoglycemic drugs: Secondary | ICD-10-CM

## 2021-04-06 DIAGNOSIS — Z6836 Body mass index (BMI) 36.0-36.9, adult: Secondary | ICD-10-CM

## 2021-04-06 DIAGNOSIS — E039 Hypothyroidism, unspecified: Secondary | ICD-10-CM | POA: Diagnosis present

## 2021-04-06 DIAGNOSIS — E785 Hyperlipidemia, unspecified: Secondary | ICD-10-CM | POA: Diagnosis present

## 2021-04-06 DIAGNOSIS — E119 Type 2 diabetes mellitus without complications: Secondary | ICD-10-CM | POA: Diagnosis present

## 2021-04-06 DIAGNOSIS — R269 Unspecified abnormalities of gait and mobility: Secondary | ICD-10-CM | POA: Diagnosis present

## 2021-04-06 DIAGNOSIS — F05 Delirium due to known physiological condition: Principal | ICD-10-CM | POA: Diagnosis present

## 2021-04-06 DIAGNOSIS — R441 Visual hallucinations: Secondary | ICD-10-CM | POA: Diagnosis present

## 2021-04-06 DIAGNOSIS — Z79890 Hormone replacement therapy (postmenopausal): Secondary | ICD-10-CM

## 2021-04-06 DIAGNOSIS — I119 Hypertensive heart disease without heart failure: Secondary | ICD-10-CM | POA: Diagnosis present

## 2021-04-06 LAB — COMPREHENSIVE METABOLIC PANEL
ALT: 14 U/L (ref 0–44)
AST: 25 U/L (ref 15–41)
Albumin: 2.7 g/dL — ABNORMAL LOW (ref 3.5–5.0)
Alkaline Phosphatase: 57 U/L (ref 38–126)
Anion gap: 8 (ref 5–15)
BUN: 9 mg/dL (ref 8–23)
CO2: 31 mmol/L (ref 22–32)
Calcium: 9.2 mg/dL (ref 8.9–10.3)
Chloride: 102 mmol/L (ref 98–111)
Creatinine, Ser: 0.75 mg/dL (ref 0.44–1.00)
GFR, Estimated: >60 mL/min (ref >60)
Glucose, Bld: 101 mg/dL — ABNORMAL HIGH (ref 70–99)
Potassium: 3.6 mmol/L (ref 3.5–5.1)
Sodium: 141 mmol/L (ref 135–145)
Total Bilirubin: 1.1 mg/dL (ref 0.3–1.2)
Total Protein: 5.1 g/dL — ABNORMAL LOW (ref 6.5–8.1)

## 2021-04-06 LAB — CBC WITH DIFFERENTIAL/PLATELET
Abs Immature Granulocytes: 0.03 10*3/uL (ref 0.00–0.07)
Basophils Absolute: 0 10*3/uL (ref 0.0–0.1)
Basophils Relative: 1 %
Eosinophils Absolute: 0.2 10*3/uL (ref 0.0–0.5)
Eosinophils Relative: 2 %
HCT: 35 % — ABNORMAL LOW (ref 36.0–46.0)
Hemoglobin: 10.5 g/dL — ABNORMAL LOW (ref 12.0–15.0)
Immature Granulocytes: 0 %
Lymphocytes Relative: 30 %
Lymphs Abs: 2.2 10*3/uL (ref 0.7–4.0)
MCH: 30 pg (ref 26.0–34.0)
MCHC: 30 g/dL (ref 30.0–36.0)
MCV: 100 fL (ref 80.0–100.0)
Monocytes Absolute: 0.6 10*3/uL (ref 0.1–1.0)
Monocytes Relative: 8 %
Neutro Abs: 4.3 10*3/uL (ref 1.7–7.7)
Neutrophils Relative %: 59 %
Platelets: 277 10*3/uL (ref 150–400)
RBC: 3.5 MIL/uL — ABNORMAL LOW (ref 3.87–5.11)
RDW: 16.8 % — ABNORMAL HIGH (ref 11.5–15.5)
WBC: 7.4 10*3/uL (ref 4.0–10.5)
nRBC: 0 % (ref 0.0–0.2)

## 2021-04-06 LAB — URINALYSIS, ROUTINE W REFLEX MICROSCOPIC
Bilirubin Urine: NEGATIVE
Glucose, UA: NEGATIVE mg/dL
Ketones, ur: NEGATIVE mg/dL
Nitrite: NEGATIVE
Protein, ur: NEGATIVE mg/dL
Specific Gravity, Urine: 1.01 (ref 1.005–1.030)
pH: 7.5 (ref 5.0–8.0)

## 2021-04-06 LAB — URINALYSIS, MICROSCOPIC (REFLEX)

## 2021-04-06 LAB — RAPID URINE DRUG SCREEN, HOSP PERFORMED
Amphetamines: NOT DETECTED
Barbiturates: NOT DETECTED
Benzodiazepines: NOT DETECTED
Cocaine: NOT DETECTED
Opiates: NOT DETECTED
Tetrahydrocannabinol: NOT DETECTED

## 2021-04-06 LAB — TSH: TSH: 1.386 u[IU]/mL (ref 0.350–4.500)

## 2021-04-06 LAB — RESP PANEL BY RT-PCR (FLU A&B, COVID) ARPGX2
Influenza A by PCR: NEGATIVE
Influenza B by PCR: NEGATIVE
SARS Coronavirus 2 by RT PCR: NEGATIVE

## 2021-04-06 LAB — AMMONIA: Ammonia: 27 umol/L (ref 9–35)

## 2021-04-06 LAB — LACTIC ACID, PLASMA
Lactic Acid, Venous: 2.3 mmol/L (ref 0.5–1.9)
Lactic Acid, Venous: 1.7 mmol/L (ref 0.5–1.9)

## 2021-04-06 LAB — ETHANOL: Alcohol, Ethyl (B): <10 mg/dL (ref <10)

## 2021-04-06 LAB — CBG MONITORING, ED: Glucose-Capillary: 90 mg/dL (ref 70–99)

## 2021-04-06 MED ORDER — LOSARTAN POTASSIUM 50 MG PO TABS
50.0000 mg | ORAL_TABLET | Freq: Every day | ORAL | Status: DC
Start: 2021-04-07 — End: 2021-04-09
  Administered 2021-04-07 – 2021-04-09 (×3): 50 mg via ORAL
  Filled 2021-04-06 (×3): qty 1

## 2021-04-06 MED ORDER — THIAMINE HCL 100 MG PO TABS
100.0000 mg | ORAL_TABLET | Freq: Every day | ORAL | Status: DC
  Administered 2021-04-07 – 2021-04-09 (×3): 100 mg via ORAL
  Filled 2021-04-06 (×3): qty 1

## 2021-04-06 MED ORDER — CEFTRIAXONE SODIUM 2 G IJ SOLR
2.0000 g | Freq: Once | INTRAMUSCULAR | Status: AC
  Administered 2021-04-06: 2 g via INTRAVENOUS
  Filled 2021-04-06: qty 20

## 2021-04-06 MED ORDER — SODIUM CHLORIDE 0.9 % IV BOLUS
1000.0000 mL | Freq: Once | INTRAVENOUS | Status: AC
  Administered 2021-04-06: 1000 mL via INTRAVENOUS

## 2021-04-06 MED ORDER — HYDROCHLOROTHIAZIDE 25 MG PO TABS
50.0000 mg | ORAL_TABLET | Freq: Every day | ORAL | Status: DC
  Administered 2021-04-07 – 2021-04-09 (×3): 50 mg via ORAL
  Filled 2021-04-06 (×3): qty 2

## 2021-04-06 MED ORDER — BETHANECHOL CHLORIDE 10 MG PO TABS
10.0000 mg | ORAL_TABLET | Freq: Every day | ORAL | Status: DC
  Administered 2021-04-07 – 2021-04-09 (×3): 10 mg via ORAL
  Filled 2021-04-06 (×3): qty 1

## 2021-04-06 MED ORDER — LEVOTHYROXINE SODIUM 25 MCG PO TABS
125.0000 ug | ORAL_TABLET | Freq: Every day | ORAL | Status: DC
  Administered 2021-04-07 – 2021-04-09 (×3): 125 ug via ORAL
  Filled 2021-04-06 (×3): qty 1

## 2021-04-06 MED ORDER — BETHANECHOL CHLORIDE 10 MG PO TABS: 10.0000 mg | ORAL_TABLET | Freq: Every day | ORAL | Status: DC

## 2021-04-06 MED ORDER — AMIODARONE HCL 200 MG PO TABS
200.0000 mg | ORAL_TABLET | Freq: Every day | ORAL | Status: DC
  Administered 2021-04-07 – 2021-04-09 (×3): 200 mg via ORAL
  Filled 2021-04-06 (×3): qty 1

## 2021-04-06 MED ORDER — ACETAMINOPHEN 325 MG PO TABS
650.0000 mg | ORAL_TABLET | Freq: Four times a day (QID) | ORAL | Status: DC | PRN
  Administered 2021-04-06: 650 mg via ORAL
  Filled 2021-04-06: qty 2

## 2021-04-06 MED ORDER — ADULT MULTIVITAMIN W/MINERALS CH
1.0000 | ORAL_TABLET | Freq: Every day | ORAL | Status: DC
  Administered 2021-04-07 – 2021-04-09 (×3): 1 via ORAL
  Filled 2021-04-06 (×3): qty 1

## 2021-04-06 MED ORDER — SODIUM CHLORIDE 0.9% FLUSH: 3.0000 mL | INTRAVENOUS | Status: DC | PRN

## 2021-04-06 MED ORDER — ACETAMINOPHEN 650 MG RE SUPP: 650.0000 mg | Freq: Four times a day (QID) | RECTAL | Status: DC | PRN

## 2021-04-06 MED ORDER — SODIUM CHLORIDE 0.9% FLUSH
3.0000 mL | Freq: Two times a day (BID) | INTRAVENOUS | Status: DC
  Administered 2021-04-07 – 2021-04-09 (×4): 3 mL via INTRAVENOUS

## 2021-04-06 MED ORDER — MULTI FOR HER 50+ PO TABS: 1.0000 | ORAL_TABLET | Freq: Every day | ORAL | Status: DC

## 2021-04-06 MED ORDER — SODIUM CHLORIDE 0.9 % IV SOLN
2.0000 g | INTRAVENOUS | Status: DC
  Administered 2021-04-07 – 2021-04-08 (×2): 2 g via INTRAVENOUS
  Filled 2021-04-06 (×3): qty 20

## 2021-04-06 MED ORDER — ATENOLOL 25 MG PO TABS
50.0000 mg | ORAL_TABLET | Freq: Every day | ORAL | Status: DC
  Administered 2021-04-07 – 2021-04-09 (×3): 50 mg via ORAL
  Filled 2021-04-06: qty 2
  Filled 2021-04-06: qty 1
  Filled 2021-04-06: qty 2

## 2021-04-06 MED ORDER — SODIUM CHLORIDE 0.9 % IV SOLN: 250.0000 mL | INTRAVENOUS | Status: DC | PRN

## 2021-04-06 MED ORDER — INSULIN ASPART 100 UNIT/ML IJ SOLN
0.0000 [IU] | Freq: Three times a day (TID) | INTRAMUSCULAR | Status: DC
  Administered 2021-04-08 – 2021-04-09 (×2): 2 [IU] via SUBCUTANEOUS

## 2021-04-06 MED ORDER — GABAPENTIN 600 MG PO TABS
300.0000 mg | ORAL_TABLET | Freq: Three times a day (TID) | ORAL | Status: DC
  Administered 2021-04-07 – 2021-04-09 (×7): 300 mg via ORAL
  Filled 2021-04-06: qty 1
  Filled 2021-04-06: qty 0.5
  Filled 2021-04-06 (×5): qty 1
  Filled 2021-04-06 (×3): qty 0.5

## 2021-04-06 MED ORDER — APIXABAN 5 MG PO TABS
5.0000 mg | ORAL_TABLET | Freq: Two times a day (BID) | ORAL | Status: DC
  Administered 2021-04-06 – 2021-04-09 (×6): 5 mg via ORAL
  Filled 2021-04-06 (×6): qty 1

## 2021-04-06 NOTE — Assessment & Plan Note (Deleted)
Could be cause of her hallucinations Continue rocephin, f/u on culture

## 2021-04-06 NOTE — Assessment & Plan Note (Deleted)
Husband states she was making good progress after being home from rehab facility over the past 1-2 weeks, but over past few days she has seemed to have a decline in her function -? If UTI contributing-treat as above -metabolic labs -holding mood drugs/decreasing gabapentin -PT/OT to eval

## 2021-04-06 NOTE — Assessment & Plan Note (Deleted)
Continue eliquis Head CT negative

## 2021-04-06 NOTE — Assessment & Plan Note (Deleted)
Continue cozaar, hctz and atenolol

## 2021-04-06 NOTE — Assessment & Plan Note (Deleted)
a1c was 6.7 in 01/2021 Hold metformin SSI and accuchecks per protocol

## 2021-04-06 NOTE — H&P (Signed)
History and Physical    Patient: Rhonda Winters MPN:[id number] DOB: 01-Jul-1945 DOA: 04/06/2021 DOS: the patient was seen and examined on 04/06/2021 PCP: Corrington, Delsa Grana, MD  Patient coming from: Home - lives with her husband   Chief Complaint: hallucinations and weakness.   HPI: Rhonda Winters is a 76 y.o. female with medical history significant of T2DM, hx of CVA, HTN, HLD, hypothyroidism, who presented to ED for concerns for hallucinations over the past 2 days and increased weakness.  She is alert and oriented and at baseline. Her husband is at bedside. She is alert and oriented and pleasant. She states she doesn't think she is confused and knows where she is when she is hallucinating. Husband states it's not so much that she is confused as it is that she is seeing people and having conversations with people who are not there. She has not fallen, no trauma. No new change in medication or over the counter drugs. History is difficult to obtain from both patient and husband.   She has overall been doing well. No fever/chills, no headaches, has had dizziness with certain head movement since her prolonged hospital stay last year, no chest pain or palpitations, no shortness of breath or cough, no abdominal  pain, N/V/D, no dysuria, urgency or frequency, no leg swelling.   Had prolonged hospital stay in November 2022-02/12/2021 for acute encephalopathy secondary to pneumonia [facility]. [facility] then [facility] [place] [facility] and has been home for 2 weeks or so. She required intubation for pneumonia, had bacteremia, tracheostomy.   Husband states he has noticed a regression in her progress from rehab over the past week. She was doing good, but seems to be getting weaker.     ER Course:  vitals: afebrile, bp: 148/97, HR: 60, RR: 16, oxygen: 95% RA Pertinent labs: lactic acid: 2.3, hgb: 10.5,  CTH: stable remote right PCA territory  infarct, stable atrophy and white matter disease. No acute intracranial abnormality.  In ED: given 1L bolus and rocephin. Cultures obtained.    Review of Systems: As mentioned in the history of present illness. All other systems reviewed and are negative. Past Medical History:  Diagnosis Date   Acquired hypothyroidism    Hypertension    Stroke ([place])    Type 2 diabetes mellitus ([place])    Past Surgical History:  Procedure Laterality Date   LOOP RECORDER INSERTION N/A 07/01/2020   Procedure: LOOP RECORDER INSERTION;  Surgeon: Constance Haw, MD;  Location: [facility];  Service: Cardiovascular;  Laterality: N/A;   Social History:  reports that she quit smoking about 32 years ago. Her smoking use included cigarettes. She has never used smokeless tobacco. She reports current alcohol use. She reports that she does not use drugs.  Allergies  Allergen Reactions   Hydrocodone-Acetaminophen Nausea And Vomiting    No family history on file.  Prior to Admission medications   Medication Sig Start Date End Date Taking? Authorizing Provider  acetaminophen (TYLENOL) 500 MG tablet Take 1,000 mg by mouth every 6 (six) hours as needed for moderate pain.   Yes [provider]  amiodarone (PACERONE) 200 MG tablet Take 200 mg by mouth daily. 03/17/21  Yes [provider]  amitriptyline (ELAVIL) 25 MG tablet Take 1 tablet by mouth at bedtime. 09/02/20  Yes [provider]  atenolol (TENORMIN) 50 MG tablet Take 1 tablet by mouth daily. 09/16/20  Yes  [provider]  gabapentin (NEURONTIN) 800 MG tablet Take 400 mg by mouth 3 (three) times daily. 1/2 tab daily 04/16/20  Yes [provider]  hydrochlorothiazide (HYDRODIURIL) 50 MG tablet Take 1 tablet by mouth daily. 09/16/20  Yes [provider]  levothyroxine (SYNTHROID) 125 MCG tablet Take 125 mcg by mouth daily before breakfast.   Yes [provider]  losartan (COZAAR) 50 MG tablet Take  50 mg by mouth daily. 03/02/21  Yes [provider]  Ampicillin-Sulbactam 3 g in sodium chloride 0.9 % 100 mL Inject 3 g into the vein every 6 (six) hours. 02/12/21   Bowser, Laurel Dimmer, NP  bethanechol (URECHOLINE) 10 MG tablet Place 1 tablet (10 mg total) into feeding tube 3 (three) times daily. Patient taking differently: Take 10 mg by mouth 3 (three) times daily. 02/12/21   Bowser, Laurel Dimmer, NP  cloNIDine (CATAPRES - DOSED IN MG/24 HR) 0.3 mg/24hr patch Place 1 patch (0.3 mg total) onto the skin once a week. 02/16/21   Bowser, Laurel Dimmer, NP  gabapentin (NEURONTIN) 250 MG/5ML solution Place 2 mLs (100 mg total) into feeding tube 3 (three) times daily. 02/12/21   Bowser, Laurel Dimmer, NP  insulin aspart (NOVOLOG) 100 UNIT/ML injection Inject 0-9 Units into the skin every 4 (four) hours. 02/12/21   Bowser, Laurel Dimmer, NP  metoprolol tartrate (LOPRESSOR) 50 MG tablet Place 1 tablet (50 mg total) into feeding tube every 8 (eight) hours. 02/12/21   Bowser, Laurel Dimmer, NP  thiamine (B-1) 100 MG/ML injection Inject 1 mL (100 mg total) into the vein daily. 02/13/21   Cristal Generous, NP    Physical Exam: Vitals:   04/06/21 1338 04/06/21 1430 04/06/21 1630 04/06/21 1715  BP:  (!) 174/93 134/88 (!) 148/64  Pulse:  (!) 117 (!) 59 60  Resp:  (!) 26 19 (!) 9  Temp: 98.8 F (37.1 C)     TempSrc: Rectal     SpO2:  96% 94% 95%  Weight:      Height:       General:  Appears calm and comfortable and is in NAD Eyes:  PERRL, EOMI, normal lids, iris ENT:  grossly normal hearing, lips & tongue, mmm; appropriate dentition. TM pearly with light reflex.  Neck:  no LAD, masses or thyromegaly; no carotid bruits Cardiovascular:  RRR, no m/r/g. No LE edema.  Respiratory:   CTA bilaterally with no wheezes/rales/rhonchi.  Normal respiratory effort. Abdomen:  soft, NT, ND, NABS Back:   normal alignment, no CVAT Skin:  no rash or induration seen on limited exam Musculoskeletal:  BUE: 4/5 BLE: 3/5, good ROM, no bony  abnormality Lower extremity:  No LE edema.  Limited foot exam with no ulcerations.  2+ distal pulses. Psychiatric:  grossly normal mood and affect, speech fluent and appropriate, AOx3 Neurologic:  CN 2-12 grossly intact, moves all extremities in coordinated fashion, sensation intact. Sensation intact.    Radiological Exams on Admission: Independently reviewed - see discussion in A/P where applicable  CT HEAD WO CONTRAST  Result Date: 04/06/2021 CLINICAL DATA:  Altered mental status.  Hallucinations. EXAM: CT HEAD WITHOUT CONTRAST TECHNIQUE: Contiguous axial images were obtained from the base of the skull through the vertex without intravenous contrast. RADIATION DOSE REDUCTION: This exam was performed according to the departmental dose-optimization program which includes automated exposure control, adjustment of the mA and/or kV according to patient size and/or use of iterative reconstruction technique. COMPARISON:  CT head without contrast 01/28/2021. MR head without  contrast 02/04/2021 FINDINGS: Brain: Remote right PCA territory infarct again noted in the right occipital and medial temporal lobe. Mild atrophy and white matter changes are otherwise stable. Basal ganglia and insular ribbon are normal bilaterally. No acute infarct, hemorrhage, or mass lesion is present. The ventricles are of normal size. No significant extraaxial fluid collection is present. The brainstem and cerebellum are within normal limits. Vascular: No hyperdense vessel or unexpected calcification. Skull: Hyperostosis again noted. No acute or focal abnormalities are present. No significant extracranial soft tissue lesion is present. Sinuses/Orbits: The paranasal sinuses and mastoid air cells are clear. Bilateral lens replacements are noted. Globes and orbits are otherwise unremarkable. IMPRESSION: 1. Stable remote right PCA territory infarct. 2. Stable atrophy and white matter disease. This likely reflects the sequela of chronic  microvascular ischemia. 3. No acute intracranial abnormality or significant interval change. Electronically Signed   By: San Morelle M.D.   On: 04/06/2021 13:46   DG Chest Portable 1 View  Result Date: 04/06/2021 CLINICAL DATA:  Cough. EXAM: PORTABLE CHEST 1 VIEW COMPARISON:  February 24, 2021. FINDINGS: Stable cardiomegaly. Both lungs are clear. The visualized skeletal structures are unremarkable. IMPRESSION: No active disease. Electronically Signed   By: Marijo Conception M.D.   On: 04/06/2021 13:27    EKG: Independently reviewed.  NSR with rate 58; nonspecific ST changes with no evidence of acute ischemia. Borderline prolonged QT.    Labs on Admission: I have personally reviewed the available labs and imaging studies at the time of the admission.  Pertinent labs:   lactic acid: 2.3 hgb: 10.5,    Assessment and Plan: Hallucinations, visual ? AMS - (present on admission) 76 year old female who presented with 2 day history of visual hallucinations and increased weakness.  -place in obs on telemetry -query if altered, but she is alert and oriented and not confused, more just hallucinating per husband. History is quite difficult to grasp from both husband and patient.  -CT head negative for acute finding -lab work with possible UTI that could be contributing to hallucinations. Continue rocephin, culture pending -will check metabolic labs -have gone through med list and she is on gabapentin 400mg  TID, elavil, cymbalta. Have decreased her gabapentin down to 300mg  TID. Held cymbalta and elavil with borderline prolonged qt -if no improvement with hallucinations with treating UTI, consider psychiatry consult   borderline QT interval - (present on admission) Borderline qt: QTc: 497 Hold cymbalta and elavil Watch closely with amiodarone, may need to decrease dose.  Check mag Telemetry ekg in AM' Avoid qt prolonging drugs  UTI (urinary tract infection) Could be cause of her  hallucinations Continue rocephin, f/u on culture   Weakness Husband states she was making good progress after being home from rehab facility over the past 1-2 weeks, but over past few days she has seemed to have a decline in her function -? If UTI contributing-treat as above -metabolic labs -holding mood drugs/decreasing gabapentin -PT/OT to eval   questionable history of afib? - (present on admission) She is on eliquis and amiodarone that husband states was started in the past month, likely at her rehab stay in [facility].  She has not followed up with cardiology that he is aware of nor does he remember hearing a diagnosis of atrial fib or flutter -she has a ILR placed after her stroke -will continue amiodarone and eliquis, will need outpatient f/u or possible records from facility in [place].  -continue atenolol   History of CVA (  cerebrovascular accident) Continue eliquis Head CT negative  HTN (hypertension)- (present on admission) Continue cozaar, hctz and atenolol   Normocytic anemia Checking iron studies Trend cbc   T2DM (type 2 diabetes mellitus) (HCC) a1c was 6.7 in 01/2021 Hold metformin SSI and accuchecks per protocol   Hypothyroidism- (present on admission) TSH wnl today Continue synthroid 147mcg    Advance Care Planning:   Code Status: Full Code   Consults: none  DVT Prophylaxis: eliquis   Family Communication: husband at bedside: kent Rymer   Severity of Illness: The appropriate patient status for this patient is OBSERVATION. Observation status is judged to be reasonable and necessary in order to provide the required intensity of service to ensure the patient's safety. The patient's presenting symptoms, physical exam findings, and initial radiographic and laboratory data in the context of their medical condition is felt to place them at decreased risk for further clinical deterioration. Furthermore, it is anticipated that the patient will be  medically stable for discharge from the hospital within 2 midnights of admission.   Author: Orma Flaming, MD 04/06/2021 8:32 PM  For on call review [email or web address].

## 2021-04-06 NOTE — Assessment & Plan Note (Deleted)
76 year old female who presented with 2 day history of visual hallucinations and increased weakness.  -place in obs on telemetry -query if altered, but she is alert and oriented and not confused, more just hallucinating per husband. History is quite difficult to grasp from both husband and patient.  -CT head negative for acute finding -lab work with possible UTI that could be contributing to hallucinations. Continue rocephin, culture pending -will check metabolic labs -have gone through med list and she is on gabapentin 400mg  TID, elavil, cymbalta. Have decreased her gabapentin down to 300mg  TID. Held cymbalta and elavil with borderline prolonged qt -if no improvement with hallucinations with treating UTI, consider psychiatry consult

## 2021-04-06 NOTE — Assessment & Plan Note (Deleted)
On gabapentin,

## 2021-04-06 NOTE — Assessment & Plan Note (Deleted)
She is on eliquis and amiodarone that husband states was started in the past month, likely at her rehab stay in [facility].  She has not followed up with cardiology that he is aware of nor does he remember hearing a diagnosis of atrial fib or flutter -she has a ILR placed after her stroke -will continue amiodarone and eliquis, will need outpatient f/u or possible records from facility in [place].  -continue atenolol

## 2021-04-06 NOTE — Assessment & Plan Note (Deleted)
Borderline qt: QTc: 497 Hold cymbalta and elavil Watch closely with amiodarone, may need to decrease dose.  Check mag Telemetry ekg in AM' Avoid qt prolonging drugs

## 2021-04-06 NOTE — ED Provider Notes (Signed)
[facility] EMERGENCY DEPARTMENT Provider Note   CSN: [id number] Arrival date & time: 04/06/21  1222     History  Chief Complaint  Patient presents with   Altered Mental Status    Rhonda Winters is a 76 y.o. female with PMHx HTN, Diabetes, Hypothyroidism, CVA on Eliquis, DDD who presents to the ED today vis EMS for altered mental status. Per EMS/husband pt has been more confused for the past week. Husband reports that pt has been having visual hallucinations as well as speaking to family members that aren't present in the house. Pt's home health nurse came out today for a weekly visit and also noticed patient to be confused. [facility] nurse left the room for some time and patient apparently forgot she was there. When she entered patient was surprised to see her there. Husband mentions a similar presentation during last hospitalization. Per chart review pt admitted at the end of November for sepsis s/2 pneumonia and AMS and ultimately required intubation and then tracheostomy. Pt returned home from rehab about 2-3 weeks ago and had been doing well per husband until last week. He does mention a new cough recently as well.   Patient denies confusion currently. She has no physical complaints at this time besides chronic back pain which she states is no worse than normal.   The history is provided by the patient, medical records, the spouse and the EMS personnel.      Home Medications Prior to Admission medications   Medication Sig Start Date End Date Taking? Authorizing Provider  acetaminophen (TYLENOL) 500 MG tablet Take 1,000 mg by mouth every 6 (six) hours as needed for moderate pain.   Yes [provider]  amiodarone (PACERONE) 200 MG tablet Take 200 mg by mouth daily. 03/17/21  Yes [provider]  amitriptyline (ELAVIL) 25 MG tablet Take 1 tablet by mouth at bedtime. 09/02/20  Yes [provider]  apixaban (ELIQUIS) 5 MG TABS tablet Take 5 mg by  mouth 2 (two) times daily.   Yes [provider]  atenolol (TENORMIN) 50 MG tablet Take 1 tablet by mouth daily. 09/16/20  Yes [provider]  bethanechol (URECHOLINE) 10 MG tablet Place 1 tablet (10 mg total) into feeding tube 3 (three) times daily. Patient taking differently: Take 10 mg by mouth daily. 02/12/21  Yes Bowser, Laurel Dimmer, NP  gabapentin (NEURONTIN) 800 MG tablet Take 400 mg by mouth 3 (three) times daily. 1/2 tab daily 04/16/20  Yes [provider]  hydrochlorothiazide (HYDRODIURIL) 50 MG tablet Take 1 tablet by mouth daily. 09/16/20  Yes [provider]  levothyroxine (SYNTHROID) 125 MCG tablet Take 125 mcg by mouth daily before breakfast.   Yes [provider]  losartan (COZAAR) 50 MG tablet Take 50 mg by mouth daily. 03/02/21  Yes [provider]  metFORMIN (GLUCOPHAGE-XR) 500 MG 24 hr tablet Take 500 mg by mouth daily with breakfast.   Yes [provider]  thiamine 100 MG tablet Take 100 mg by mouth daily.   Yes [provider]  traMADol (ULTRAM) 50 MG tablet Take 50-100 mg by mouth every 6 (six) hours as needed.   Yes [provider]  Ampicillin-Sulbactam 3 g in sodium chloride 0.9 % 100 mL Inject 3 g into the vein every 6 (six) hours. Patient not taking: Reported on 04/06/2021 02/12/21   Cristal Generous, NP  gabapentin (NEURONTIN) 250 MG/5ML solution Place 2 mLs (100 mg total) into feeding tube 3 (three)  times daily. 02/12/21   Bowser, Laurel Dimmer, NP  thiamine (B-1) 100 MG/ML injection Inject 1 mL (100 mg total) into the vein daily. 02/13/21   Bowser, Laurel Dimmer, NP  vitamin E 180 MG (400 UNITS) capsule Take 400 Units by mouth daily.    [provider]      Allergies    Hydrocodone-acetaminophen    Review of Systems   Review of Systems  Unable to perform ROS: Mental status change  Constitutional:  Negative for fever.  Respiratory:  Positive for cough.   Cardiovascular:  Negative for chest pain.   Gastrointestinal:  Negative for abdominal pain, diarrhea, nausea and vomiting.  Genitourinary:  Negative for dysuria and frequency.  Musculoskeletal:  Positive for back pain (chronic).  Neurological:  Negative for headaches.  Psychiatric/Behavioral:  Positive for confusion.   All other systems reviewed and are negative.  Physical Exam Updated Vital Signs BP (!) 174/93    Pulse (!) 117    Temp 98.8 F (37.1 C) (Rectal)    Resp (!) 26    Ht 5\' 2"  (1.575 m)    Wt 106 kg    SpO2 96%    BMI 42.74 kg/m  Physical Exam Vitals and nursing note reviewed.  Constitutional:      Appearance: She is not ill-appearing or diaphoretic.  HENT:     Head: Normocephalic and atraumatic.  Eyes:     Extraocular Movements: Extraocular movements intact.     Conjunctiva/sclera: Conjunctivae normal.     Pupils: Pupils are equal, round, and reactive to light.  Cardiovascular:     Rate and Rhythm: Normal rate and regular rhythm.     Pulses: Normal pulses.  Pulmonary:     Effort: Pulmonary effort is normal.     Breath sounds: Normal breath sounds. No wheezing, rhonchi or rales.  Abdominal:     Palpations: Abdomen is soft.     Tenderness: There is no abdominal tenderness. There is no guarding or rebound.  Musculoskeletal:     Cervical back: Neck supple.  Skin:    General: Skin is warm and dry.  Neurological:     Mental Status: She is alert.     Comments: Alert and oriented to self, place, time and event.   Speech is fluent, clear without dysarthria or dysphasia.   Strength 5/5 in upper/lower extremities   Sensation intact in upper/lower extremities   No pronator drift.  Normal finger-to-nose and feet tapping.  CN I not tested  CN II grossly intact visual fields bilaterally. Did not visualize posterior eye.  CN III, IV, VI PERRLA and EOMs intact bilaterally  CN V Intact sensation to sharp and light touch to the face  CN VII facial movements symmetric  CN VIII not tested  CN IX, X no uvula  deviation, symmetric rise of soft palate  CN XI 5/5 SCM and trapezius strength bilaterally  CN XII Midline tongue protrusion, symmetric L/R movements       ED Results / Procedures / Treatments   Labs (all labs ordered are listed, but only abnormal results are displayed) Labs Reviewed  COMPREHENSIVE METABOLIC PANEL - Abnormal; Notable for the following components:      Result Value   Glucose, Bld 101 (*)    Total Protein 5.1 (*)    Albumin 2.7 (*)    All other components within normal limits  URINALYSIS, ROUTINE W REFLEX MICROSCOPIC - Abnormal; Notable for the following components:   APPearance HAZY (*)    Hgb  urine dipstick TRACE (*)    Leukocytes,Ua TRACE (*)    All other components within normal limits  LACTIC ACID, PLASMA - Abnormal; Notable for the following components:   Lactic Acid, Venous 2.3 (*)    All other components within normal limits  CBC WITH DIFFERENTIAL/PLATELET - Abnormal; Notable for the following components:   RBC 3.50 (*)    Hemoglobin 10.5 (*)    HCT 35.0 (*)    RDW 16.8 (*)    All other components within normal limits  URINALYSIS, MICROSCOPIC (REFLEX) - Abnormal; Notable for the following components:   Bacteria, UA MANY (*)    Non Squamous Epithelial PRESENT (*)    All other components within normal limits  RESP PANEL BY RT-PCR (FLU A&B, COVID) ARPGX2  CULTURE, BLOOD (ROUTINE X 2)  CULTURE, BLOOD (ROUTINE X 2)  URINE CULTURE  AMMONIA  ETHANOL  RAPID URINE DRUG SCREEN, HOSP PERFORMED  TSH  CBC WITH DIFFERENTIAL/PLATELET  LACTIC ACID, PLASMA  CBG MONITORING, ED    EKG None  Radiology CT HEAD WO CONTRAST  Result Date: 04/06/2021 CLINICAL DATA:  Altered mental status.  Hallucinations. EXAM: CT HEAD WITHOUT CONTRAST TECHNIQUE: Contiguous axial images were obtained from the base of the skull through the vertex without intravenous contrast. RADIATION DOSE REDUCTION: This exam was performed according to the departmental dose-optimization program  which includes automated exposure control, adjustment of the mA and/or kV according to patient size and/or use of iterative reconstruction technique. COMPARISON:  CT head without contrast 01/28/2021. MR head without contrast 02/04/2021 FINDINGS: Brain: Remote right PCA territory infarct again noted in the right occipital and medial temporal lobe. Mild atrophy and white matter changes are otherwise stable. Basal ganglia and insular ribbon are normal bilaterally. No acute infarct, hemorrhage, or mass lesion is present. The ventricles are of normal size. No significant extraaxial fluid collection is present. The brainstem and cerebellum are within normal limits. Vascular: No hyperdense vessel or unexpected calcification. Skull: Hyperostosis again noted. No acute or focal abnormalities are present. No significant extracranial soft tissue lesion is present. Sinuses/Orbits: The paranasal sinuses and mastoid air cells are clear. Bilateral lens replacements are noted. Globes and orbits are otherwise unremarkable. IMPRESSION: 1. Stable remote right PCA territory infarct. 2. Stable atrophy and white matter disease. This likely reflects the sequela of chronic microvascular ischemia. 3. No acute intracranial abnormality or significant interval change. Electronically Signed   By: San Morelle M.D.   On: 04/06/2021 13:46   DG Chest Portable 1 View  Result Date: 04/06/2021 CLINICAL DATA:  Cough. EXAM: PORTABLE CHEST 1 VIEW COMPARISON:  February 24, 2021. FINDINGS: Stable cardiomegaly. Both lungs are clear. The visualized skeletal structures are unremarkable. IMPRESSION: No active disease. Electronically Signed   By: Marijo Conception M.D.   On: 04/06/2021 13:27    Procedures Procedures    Medications Ordered in ED Medications  cefTRIAXone (ROCEPHIN) 2 g in sodium chloride 0.9 % 100 mL IVPB (has no administration in time range)  sodium chloride 0.9 % bolus 1,000 mL (1,000 mLs Intravenous New Bag/Given 04/06/21 1428)     ED Course/ Medical Decision Making/ A&P Clinical Course as of 04/06/21 1508  Tue Apr 06, 2021  1330 Hemoglobin(!): 10.5 Improved from previous [MV]  1330 DG Chest Portable 1 View Negative for pneumonia [MV]  1350 CT HEAD WO CONTRAST Clear. [MV]  1417 Lactic Acid, Venous(!!): 2.3 [MV]    Clinical Course User Index [MV] Eustaquio Maize, PA-C  Medical Decision Making 76 year old female presenting to the ED today for AMS x 1 week with visual hallucinations. On arrival to the ED VSS. Pt is afebrile, nontachycardic, and nontachypneic. She appears to be in NAD. Currently A&O x 4 and following commands without difficulty. No focal neuro deficits on exam today. Hx of stroke in the past as well as recent admission for sepsis s/2 pneumonia requiring intubation/tracheostomy in December.   Will plan to work up for AMS at this time with EKG, CT Head, CXR to rule out new pneumonia as husband reports new onset cough, U/A to assess for UTI and labs including CBC, CMP, lactic acid, ammonia level, ETOH, UDS, and TSH with hx of hypothyroidism.   Problems Addressed: Acute cystitis without hematuria: acute illness or injury    Details: Urine suspcious for UTI at this time which may be cause of AMS. Urine culture sent. Rocephin started. Will plan to admit given hx of AMS with plans to observe if mentation improves once urine clears with appropriate treatment. Altered mental status, unspecified altered mental status type: acute illness or injury    Details: Remainder of workup reassuring at this time without other obvious cause besides urinary tract infection Hallucinations: acute illness or injury  Amount and/or Complexity of Data Reviewed Labs: ordered. Decision-making details documented in ED Course.    Details: CBC without leukocytosis. Hgb improved at 10.5 CMP with glucose 101. No other electrolyte abnormalities. Creatinine 0.75.  Lactic acid elevated at 2.3; fluids  provided at this time.  EtOH negative TSH 1.386 U/A with trace hgb on dipstick, trace leuks, 6-10 WBCs, many bacteria and non squamous epithelial cells. Will send for culture at this time with hx of AMS without obvious cause and treat for urinary tract infection at this time. Ammonia level 27 Radiology: ordered. Decision-making details documented in ED Course.    Details: CXR clear CT Head without concern for bleed or other abnormality. Does show remote infarct from previous. Discussion of management or test interpretation with external provider(s): Discussed case with [facility] Dr. Rogers Blocker who agrees to evaluate patient for admission   Risk Decision regarding hospitalization.          Final Clinical Impression(s) / ED Diagnoses Final diagnoses:  Altered mental status, unspecified altered mental status type  Hallucinations  Acute cystitis without hematuria    Rx / DC Orders ED Discharge Orders     None         Eustaquio Maize, PA-C 04/06/21 1508    Godfrey Pick, MD 04/06/21 [phone]

## 2021-04-06 NOTE — ED Triage Notes (Signed)
Pt's home health nurse arrived for weekly visit and found pt to be confused, altered mentation. Home health nurse stepped out, then found pt on floor after apparent fall, but no head strike and no injury noted. Pt intermittently confused on arrival but VSS, airway intact and GCS 14.

## 2021-04-06 NOTE — Assessment & Plan Note (Deleted)
Checking iron studies Trend cbc

## 2021-04-06 NOTE — Assessment & Plan Note (Deleted)
TSH wnl today Continue synthroid 18mcg

## 2021-04-07 DIAGNOSIS — Z6836 Body mass index (BMI) 36.0-36.9, adult: Secondary | ICD-10-CM | POA: Diagnosis not present

## 2021-04-07 DIAGNOSIS — E039 Hypothyroidism, unspecified: Secondary | ICD-10-CM | POA: Diagnosis present

## 2021-04-07 DIAGNOSIS — Z20822 Contact with and (suspected) exposure to covid-19: Secondary | ICD-10-CM | POA: Diagnosis present

## 2021-04-07 DIAGNOSIS — Z7984 Long term (current) use of oral hypoglycemic drugs: Secondary | ICD-10-CM | POA: Diagnosis not present

## 2021-04-07 DIAGNOSIS — Z7901 Long term (current) use of anticoagulants: Secondary | ICD-10-CM | POA: Diagnosis not present

## 2021-04-07 DIAGNOSIS — R443 Hallucinations, unspecified: Secondary | ICD-10-CM | POA: Diagnosis present

## 2021-04-07 DIAGNOSIS — Z87891 Personal history of nicotine dependence: Secondary | ICD-10-CM | POA: Diagnosis not present

## 2021-04-07 DIAGNOSIS — Z79891 Long term (current) use of opiate analgesic: Secondary | ICD-10-CM | POA: Diagnosis not present

## 2021-04-07 DIAGNOSIS — E119 Type 2 diabetes mellitus without complications: Secondary | ICD-10-CM | POA: Diagnosis present

## 2021-04-07 DIAGNOSIS — G9340 Encephalopathy, unspecified: Secondary | ICD-10-CM | POA: Diagnosis not present

## 2021-04-07 DIAGNOSIS — I119 Hypertensive heart disease without heart failure: Secondary | ICD-10-CM | POA: Diagnosis present

## 2021-04-07 DIAGNOSIS — Z79899 Other long term (current) drug therapy: Secondary | ICD-10-CM | POA: Diagnosis not present

## 2021-04-07 DIAGNOSIS — R441 Visual hallucinations: Secondary | ICD-10-CM | POA: Diagnosis present

## 2021-04-07 DIAGNOSIS — Z79890 Hormone replacement therapy (postmenopausal): Secondary | ICD-10-CM | POA: Diagnosis not present

## 2021-04-07 DIAGNOSIS — Z885 Allergy status to narcotic agent status: Secondary | ICD-10-CM | POA: Diagnosis not present

## 2021-04-07 DIAGNOSIS — R269 Unspecified abnormalities of gait and mobility: Secondary | ICD-10-CM | POA: Diagnosis present

## 2021-04-07 DIAGNOSIS — B961 Klebsiella pneumoniae [K. pneumoniae] as the cause of diseases classified elsewhere: Secondary | ICD-10-CM | POA: Diagnosis present

## 2021-04-07 DIAGNOSIS — Z8673 Personal history of transient ischemic attack (TIA), and cerebral infarction without residual deficits: Secondary | ICD-10-CM | POA: Diagnosis not present

## 2021-04-07 DIAGNOSIS — D649 Anemia, unspecified: Secondary | ICD-10-CM | POA: Diagnosis present

## 2021-04-07 DIAGNOSIS — E669 Obesity, unspecified: Secondary | ICD-10-CM | POA: Diagnosis present

## 2021-04-07 DIAGNOSIS — E785 Hyperlipidemia, unspecified: Secondary | ICD-10-CM | POA: Diagnosis present

## 2021-04-07 DIAGNOSIS — F05 Delirium due to known physiological condition: Secondary | ICD-10-CM | POA: Diagnosis present

## 2021-04-07 DIAGNOSIS — N3000 Acute cystitis without hematuria: Secondary | ICD-10-CM | POA: Diagnosis present

## 2021-04-07 LAB — MAGNESIUM: Magnesium: 1.3 mg/dL — ABNORMAL LOW (ref 1.7–2.4)

## 2021-04-07 LAB — BASIC METABOLIC PANEL
Anion gap: 9 (ref 5–15)
BUN: 7 mg/dL — ABNORMAL LOW (ref 8–23)
CO2: 31 mmol/L (ref 22–32)
Calcium: 9.6 mg/dL (ref 8.9–10.3)
Chloride: 101 mmol/L (ref 98–111)
Creatinine, Ser: 0.66 mg/dL (ref 0.44–1.00)
GFR, Estimated: >60 mL/min (ref >60)
Glucose, Bld: 94 mg/dL (ref 70–99)
Potassium: 3.1 mmol/L — ABNORMAL LOW (ref 3.5–5.1)
Sodium: 141 mmol/L (ref 135–145)

## 2021-04-07 LAB — FERRITIN: Ferritin: 127 ng/mL (ref 11–307)

## 2021-04-07 LAB — IRON AND TIBC
Iron: 60 ug/dL (ref 28–170)
Saturation Ratios: 23 % (ref 10.4–31.8)
TIBC: 266 ug/dL (ref 250–450)
UIBC: 206 ug/dL

## 2021-04-07 LAB — CBC
HCT: 33.8 % — ABNORMAL LOW (ref 36.0–46.0)
Hemoglobin: 10.3 g/dL — ABNORMAL LOW (ref 12.0–15.0)
MCH: 30.3 pg (ref 26.0–34.0)
MCHC: 30.5 g/dL (ref 30.0–36.0)
MCV: 99.4 fL (ref 80.0–100.0)
Platelets: 254 10*3/uL (ref 150–400)
RBC: 3.4 MIL/uL — ABNORMAL LOW (ref 3.87–5.11)
RDW: 16.9 % — ABNORMAL HIGH (ref 11.5–15.5)
WBC: 4.9 10*3/uL (ref 4.0–10.5)
nRBC: 0 % (ref 0.0–0.2)

## 2021-04-07 LAB — CBG MONITORING, ED
Glucose-Capillary: 96 mg/dL (ref 70–99)
Glucose-Capillary: 112 mg/dL — ABNORMAL HIGH (ref 70–99)

## 2021-04-07 LAB — VITAMIN B12: Vitamin B-12: 406 pg/mL (ref 180–914)

## 2021-04-07 LAB — GLUCOSE, CAPILLARY: Glucose-Capillary: 92 mg/dL (ref 70–99)

## 2021-04-07 MED ORDER — DIPHENHYDRAMINE HCL 25 MG PO CAPS
25.0000 mg | ORAL_CAPSULE | Freq: Once | ORAL | Status: AC
  Administered 2021-04-07: 25 mg via ORAL
  Filled 2021-04-07: qty 1

## 2021-04-07 NOTE — Progress Notes (Signed)
PROGRESS NOTE    Rhonda Winters  GBT:[id number] DOB: [birth year] DOA: 04/06/2021 PCP: Curly Rim, MD   Brief Narrative:  Rhonda Winters is a 76 y.o. female with medical history significant of T2DM, hx of CVA, HTN, HLD, hypothyroidism, who presented to ED for concerns for hallucinations over the past 2 days and increased weakness.  She remains oriented x4 at baseline but continues to hallucinate that family members and friends are at bedside.  No recent trauma, falls, infectious process, medication changes.  There is some question about sleep difficulties, initially patient reports she has "not slept in 2 days" but then retracts and states that she "sleeps just fine".  Had prolonged hospital stay in November 2022-02/12/2021 for acute encephalopathy secondary to pneumonia and [facility] She then [facility] [place] [facility] and has been home for 2 approximately weeks.  Assessment & Plan:   Acute onset visual hallucinations; without auditory hallucinations Concurrent encephalopathy/sundowning cannot be ruled out Rule out polypharmacy -Patient clearly states there are 9 people in her room this morning including daughter's husband EMS drivers and friends, in reality only myself and nurse were at bedside. -Imaging negative, labs including UDS and UA are unimpressive -UA appears to be dirty catch -Concern for sleep deprivation and sundowning at home versus polypharmacy -We will continue to hold any CNS depressing medications while here -Home medications include gabapentin 400mg  TID, elavil, cymbalta.  Admission team weaned gabapentin down to 300mg  TID.  Continue to hold cymbalta and elavil with borderline prolonged qt -Treat questionable UTI as below, if no improvement in 24 hours we will discuss with neurology and psych for further recommendations   Borderline QT interval - (present on admission) Borderline qt: QTc: 497 Hold  cymbalta and elavil   Questionable UTI (urinary tract infection) Continue ceftriaxone x3 days, follow urine culture History difficult to obtain, patient does not recall any urinary symptoms but again is poor historian   Weakness and ambulatory dysfunction likely subacute on chronic -Patient had recently graduated from SNF facility after prolonged hospitalization and rehab as outlined above  -He has began to decline over the past 2 weeks -unclear if related to above  -PT OT to follow   Questionable history of afib? - (present on admission) -Continues on Eliquis, atenolol, and amiodarone -Eliquis and amiodarone are new medications -Family is not aware of any cardiology follow-up although May 2022 patient does have loop recorder placed -this is likely followed by cardiology at outside facility given no records in our system   History of CVA (cerebrovascular accident) Continue eliquis Head CT negative   HTN (hypertension)- (present on admission) Continue cozaar, hctz and atenolol    Normocytic anemia, likely of chronic disease Iron studies unremarkable Trend cbc    T2DM (type 2 diabetes mellitus) ([facility]) a1c was 6.7 in 01/2021 Hold metformin SSI and accuchecks per protocol    Hypothyroidism TSH wnl today Continue synthroid 152mcg    DVT Prophylaxis: eliquis  CODE STATUS:   Code Status: Full Code  Consults: none Family Communication: husband at bedside: kent Statzer   Status is: Inpatient  Dispo: The patient is from: Home              Anticipated d/c is to: To be determined              Anticipated d/c date is: 48 to 72 hours  Patient currently not medically stable for discharge  Consultants:  None  Procedures:  None  Antimicrobials:  Ceftriaxone x3 days  Subjective: No acute issues or events overnight, still hallucinating, reports 9 people in the room when only 1 nurse and myself are present.  No auditory hallucinations though, otherwise denies nausea  vomiting diarrhea constipation headache fevers chills chest pain urinary symptoms: hesitancy frequency urgency.  Objective: Vitals:   04/06/21 2130 04/07/21 0000 04/07/21 0045 04/07/21 0551  BP: (!) 173/89 (!) 166/82  (!) 158/84  Pulse: (!) 57 (!) 59 64 63  Resp: 15 18 (!) 26 17  Temp:      TempSrc:      SpO2: 94% 93% 91% 93%  Weight:      Height:        Intake/Output Summary (Last 24 hours) at 04/07/2021 0808 Last data filed at 04/06/2021 1549 Gross per 24 hour  Intake 100 ml  Output --  Net 100 ml   Filed Weights   04/06/21 1252  Weight: 106 kg    Examination:  General:  Pleasantly resting in bed, No acute distress.  Alert to person place and situation, continues to hallucinate people present that are not currently in the room. HEENT:  Normocephalic atraumatic.  Sclerae nonicteric, noninjected.  Extraocular movements intact bilaterally. Neck:  Without mass or deformity.  Trachea is midline. Lungs:  Clear to auscultate bilaterally without rhonchi, wheeze, or rales. Heart:  Regular rate and rhythm.  Without murmurs, rubs, or gallops. Abdomen:  Soft, nontender, nondistended.  Without guarding or rebound. Extremities: Without cyanosis, clubbing, edema, or obvious deformity. Vascular:  Dorsalis pedis and posterior tibial pulses palpable bilaterally. Skin:  Warm and dry, no erythema, no ulcerations.   Data Reviewed: I have personally reviewed following labs and imaging studies  CBC: Recent Labs  Lab 04/06/21 1323 04/07/21 0449  WBC 7.4 4.9  NEUTROABS 4.3  --   HGB 10.5* 10.3*  HCT 35.0* 33.8*  MCV 100.0 99.4  PLT 277 601   Basic Metabolic Panel: Recent Labs  Lab 04/06/21 1255 04/07/21 0449  NA 141 141  K 3.6 3.1*  CL 102 101  CO2 31 31  GLUCOSE 101* 94  BUN 9 7*  CREATININE 0.75 0.66  CALCIUM 9.2 9.6  MG  --  1.3*   GFR: Estimated Creatinine Clearance: 69.5 mL/min (by C-G formula based on SCr of 0.66 mg/dL). Liver Function Tests: Recent Labs  Lab  04/06/21 1255  AST 25  ALT 14  ALKPHOS 57  BILITOT 1.1  PROT 5.1*  ALBUMIN 2.7*   No results for input(s): LIPASE, AMYLASE in the last 168 hours. Recent Labs  Lab 04/06/21 1238  AMMONIA 27   Coagulation Profile: No results for input(s): INR, PROTIME in the last 168 hours. Cardiac Enzymes: No results for input(s): CKTOTAL, CKMB, CKMBINDEX, TROPONINI in the last 168 hours. BNP (last 3 results) No results for input(s): PROBNP in the last 8760 hours. HbA1C: No results for input(s): HGBA1C in the last 72 hours. CBG: Recent Labs  Lab 04/06/21 2218  GLUCAP 90   Lipid Profile: No results for input(s): CHOL, HDL, LDLCALC, TRIG, CHOLHDL, LDLDIRECT in the last 72 hours. Thyroid Function Tests: Recent Labs    04/06/21 1255  TSH 1.386   Anemia Panel: Recent Labs    04/07/21 0449  VITAMINB12 406  FERRITIN 127  TIBC 266  IRON 60   Sepsis Labs: Recent Labs  Lab 04/06/21 1255 04/06/21 1300  LATICACIDVEN 2.3* 1.7  Recent Results (from the past 240 hour(s))  Resp Panel by RT-PCR (Flu A&B, Covid) Nasopharyngeal Swab     Status: None   Collection Time: 04/06/21 12:49 PM   Specimen: Nasopharyngeal Swab; Nasopharyngeal(NP) swabs in vial transport medium  Result Value Ref Range Status   SARS Coronavirus 2 by RT PCR NEGATIVE NEGATIVE Final    Comment: (NOTE) SARS-CoV-2 target nucleic acids are NOT DETECTED.  The SARS-CoV-2 RNA is generally detectable in upper respiratory specimens during the acute phase of infection. The lowest concentration of SARS-CoV-2 viral copies this assay can detect is 138 copies/mL. A negative result does not preclude SARS-Cov-2 infection and should not be used as the sole basis for treatment or other patient management decisions. A negative result may occur with  improper specimen collection/handling, submission of specimen other than nasopharyngeal swab, presence of viral mutation(s) within the areas targeted by this assay, and inadequate  number of viral copies(<138 copies/mL). A negative result must be combined with clinical observations, patient history, and epidemiological information. The expected result is Negative.  Fact Sheet for Patients:  EntrepreneurPulse.com.au  Fact Sheet for Healthcare Providers:  IncredibleEmployment.be  This test is no t yet approved or cleared by the [place] FDA and  has been authorized for detection and/or diagnosis of SARS-CoV-2 by FDA under an Emergency Use Authorization (EUA). This EUA will remain  in effect (meaning this test can be used) for the duration of the COVID-19 declaration under Section 564(b)(1) of the Act, 21 U.S.C.section 360bbb-3(b)(1), unless the authorization is terminated  or revoked sooner.       Influenza A by PCR NEGATIVE NEGATIVE Final   Influenza B by PCR NEGATIVE NEGATIVE Final    Comment: (NOTE) The Xpert Xpress SARS-CoV-2/FLU/RSV plus assay is intended as an aid in the diagnosis of influenza from Nasopharyngeal swab specimens and should not be used as a sole basis for treatment. Nasal washings and aspirates are unacceptable for Xpert Xpress SARS-CoV-2/FLU/RSV testing.  Fact Sheet for Patients: EntrepreneurPulse.com.au  Fact Sheet for Healthcare Providers: IncredibleEmployment.be  This test is not yet approved or cleared by the [place] FDA and has been authorized for detection and/or diagnosis of SARS-CoV-2 by FDA under an Emergency Use Authorization (EUA). This EUA will remain in effect (meaning this test can be used) for the duration of the COVID-19 declaration under Section 564(b)(1) of the Act, 21 U.S.C. section 360bbb-3(b)(1), unless the authorization is terminated or revoked.  Performed at [facility], [place] [address] [place], [place] [address]     Radiology Studies: CT HEAD WO CONTRAST  Result Date: 04/06/2021 CLINICAL DATA:  Altered mental  status.  Hallucinations. EXAM: CT HEAD WITHOUT CONTRAST TECHNIQUE: Contiguous axial images were obtained from the base of the skull through the vertex without intravenous contrast. RADIATION DOSE REDUCTION: This exam was performed according to the departmental dose-optimization program which includes automated exposure control, adjustment of the mA and/or kV according to patient size and/or use of iterative reconstruction technique. COMPARISON:  CT head without contrast 01/28/2021. MR head without contrast 02/04/2021 FINDINGS: Brain: Remote right PCA territory infarct again noted in the right occipital and medial temporal lobe. Mild atrophy and white matter changes are otherwise stable. Basal ganglia and insular ribbon are normal bilaterally. No acute infarct, hemorrhage, or mass lesion is present. The ventricles are of normal size. No significant extraaxial fluid collection is present. The brainstem and cerebellum are within normal limits. Vascular: No hyperdense vessel or unexpected calcification. Skull: Hyperostosis again noted. No acute  or focal abnormalities are present. No significant extracranial soft tissue lesion is present. Sinuses/Orbits: The paranasal sinuses and mastoid air cells are clear. Bilateral lens replacements are noted. Globes and orbits are otherwise unremarkable. IMPRESSION: 1. Stable remote right PCA territory infarct. 2. Stable atrophy and white matter disease. This likely reflects the sequela of chronic microvascular ischemia. 3. No acute intracranial abnormality or significant interval change. Electronically Signed   By: San Morelle M.D.   On: 04/06/2021 13:46   DG Chest Portable 1 View  Result Date: 04/06/2021 CLINICAL DATA:  Cough. EXAM: PORTABLE CHEST 1 VIEW COMPARISON:  February 24, 2021. FINDINGS: Stable cardiomegaly. Both lungs are clear. The visualized skeletal structures are unremarkable. IMPRESSION: No active disease. Electronically Signed   By: Marijo Conception M.D.    On: 04/06/2021 13:27    Scheduled Meds:  amiodarone  200 mg Oral Daily   apixaban  5 mg Oral BID   atenolol  50 mg Oral Daily   bethanechol  10 mg Oral Daily   gabapentin  300 mg Oral TID   hydrochlorothiazide  50 mg Oral Daily   insulin aspart  0-15 Units Subcutaneous TID WC   levothyroxine  125 mcg Oral QAC breakfast   losartan  50 mg Oral Daily   multivitamin with minerals  1 tablet Oral Daily   sodium chloride flush  3 mL Intravenous Q12H   thiamine  100 mg Oral Daily   Continuous Infusions:  sodium chloride     cefTRIAXone (ROCEPHIN)  IV      LOS: 0 days   Time spent: 56min  Randi Poullard C Zorion Nims, DO [facility]  If 7PM-7AM, please contact night-coverage [email or web address]  04/07/2021, 8:08 AM

## 2021-04-07 NOTE — Evaluation (Signed)
Occupational Therapy Evaluation Patient Details Name: Rhonda Winters MRN: [id number] DOB: Jan 31, 1946 Today's Date: 04/07/2021   History of Present Illness Pt is a 28 who presented due to concerns of confusion, hallucinations and weakness.Workup underway for UTI. PMH: DM, HTN, large R PCA (occipital, temproal and hippocampus). Prolonged hospital stay in November 2022-02/12/2021 for acute encephalopathy secondary to pneumonia (required intubation for pneumonia, had bacteremia, tracheostomy)  [facility]. She then [facility] [place] [facility] and has been home for 2 weeks and receiving HHOT/PT.   Clinical Impression   Pt alert and oriented however actively hallucinating throughout session.  Unsure of functional level since DC from inpatient rehab, however pt states she is using a RW to walk, was able to bath and dress herself and that she has been participating with [place]. Attempted to mobilize to EOB, however pt with significant posterior push with complaints of dizziness and nausea. Required +2 Total A with bed mobility and unable to achieve full upright sitting posture. Overall Max to total A with ADL tasks. Pt incontinent of BM during session -When cleaning pt, noted MASD on her bottom and peri-area with several excoriated areas - nsg notified. Moisture barrier cream applied.  Given the significant decline in functional status, recommend rehab at SNF at this time. Acute OT will follow and update DC recommendations if cognition clears and mobility and ADL status improve.     Recommendations for follow up therapy are one component of a multi-disciplinary discharge planning process, led by the attending physician.  Recommendations may be updated based on patient status, additional functional criteria and insurance authorization.   Follow Up Recommendations  Skilled nursing-short term rehab (<3 hours/day)    Assistance Recommended at  Discharge Frequent or constant Supervision/Assistance  Patient can return home with the following Two people to help with walking and/or transfers;Two people to help with bathing/dressing/bathroom;Direct supervision/assist for financial management;Direct supervision/assist for medications management;Assistance with feeding;Assistance with cooking/housework;Assist for transportation;Help with stairs or ramp for entrance    Functional Status Assessment  Patient has had a recent decline in their functional status and demonstrates the ability to make significant improvements in function in a reasonable and predictable amount of time.  Equipment Recommendations  None recommended by OT    Recommendations for Other Services       Precautions / Restrictions Precautions Precautions: Fall Precaution Comments: excoriatead skin; MASD; L field cut      Mobility Bed Mobility Overal bed mobility: Needs Assistance Bed Mobility: Rolling, Supine to Sit, Sit to Supine Rolling: Min assist, +2 for safety/equipment   Supine to sit: Max assist, +2 for physical assistance Sit to supine: Total assist, +2 for physical assistance   General bed mobility comments: when moving toward EOB, pt anxious and unable to achieve full upright posture due to pushing posterioroly; complaining of dizziness and feeling nauseated    Transfers                   General transfer comment: unable to stand at this time      Balance Overall balance assessment: Needs assistance   Sitting balance-Leahy Scale: Zero                                     ADL either performed or assessed with clinical judgement   ADL Overall ADL's : Needs assistance/impaired Eating/Feeding: Minimal  assistance   Grooming: Minimal assistance   Upper Body Bathing: Moderate assistance   Lower Body Bathing: Maximal assistance   Upper Body Dressing : Moderate assistance   Lower Body Dressing: Maximal assistance                Functional mobility during ADLs:  (unable to progress off stretcher due to significatn posterioro push)       Vision Baseline Vision/History: 1 Wears glasses Vision Assessment?: Vision impaired- to be further tested in functional context Additional Comments: L field cut form prior CVA; has prisms in glasses to "help her see"     Perception     Praxis Praxis Praxis-Other Comments: will further assess; ? motor planning deficit LUE    Pertinent Vitals/Pain Pain Assessment Pain Assessment: Faces Faces Pain Scale: Hurts little more Pain Location: discomfort with bed mobility Pain Descriptors / Indicators: Grimacing Pain Intervention(s): Limited activity within patient's tolerance     Hand Dominance Right   Extremity/Trunk Assessment Upper Extremity Assessment Upper Extremity Assessment: LUE deficits/detail LUE Deficits / Details: apparent motor planning difficulties   Lower Extremity Assessment Lower Extremity Assessment: Defer to PT evaluation   Cervical / Trunk Assessment Cervical / Trunk Assessment: Other exceptions (posterior bias)   Communication     Cognition Arousal/Alertness: Awake/alert Behavior During Therapy: Restless Overall Cognitive Status: Impaired/Different from baseline Area of Impairment: Attention, Safety/judgement, Awareness, Problem solving                   Current Attention Level: Sustained     Safety/Judgement: Decreased awareness of safety, Decreased awareness of deficits Awareness: Emergent Problem Solving: Slow processing, Difficulty sequencing, Requires verbal cues, Requires tactile cues General Comments: Did not know what day of the week it was, but was able to report the date. Pt talking to people in the room prior to entry that were not there.Having visual halluciantions while therapists were in the room; Difficulty with problem solving and appaent sensory motor deificits     General Comments  MASD with excoriated  areas on  buttocks and around vaginal area; incontinenet of BM; skin cleaned and moisture barrier cream applied - nsg made aware    Exercises     Shoulder Instructions      Home Living Family/patient expects to be discharged to:: Private residence Living Arrangements: Spouse/significant other Available Help at Discharge: Family Type of Home: House Home Access: Stairs to enter Technical brewer of Steps: 6 Entrance Stairs-Rails: Left Home Layout: One level     Bathroom Shower/Tub: Occupational psychologist: [place] - single point;Shower seat;Grab bars - tub/shower;Grab bars - toilet;Rolling Walker (2 wheels);BSC/3in1;Wheelchair - manual;Rollator (4 wheels)          Prior Functioning/Environment Prior Level of Function : Patient poor historian/Family not available;Needs assist             Mobility Comments: Using RW for mobility tasks ADLs Comments: Was working with HHOT; staes she was using her RW adn was able to bath/dress herself        OT Problem List: Decreased strength;Decreased range of motion;Decreased activity tolerance;Impaired balance (sitting and/or standing);Impaired vision/perception;Decreased coordination;Decreased cognition;Decreased safety awareness;Decreased knowledge of use of DME or AE;Obesity      OT Treatment/Interventions: Self-care/ADL training;Therapeutic exercise;Neuromuscular education;DME and/or AE instruction;Therapeutic activities;Cognitive remediation/compensation;Visual/perceptual remediation/compensation;Patient/family education;Balance training    OT Goals(Current goals can be found in the care plan section) Acute Rehab OT Goals Patient Stated Goal: unable to state OT  Goal Formulation: Patient unable to participate in goal setting Time For Goal Achievement: 04/21/21 Potential to Achieve Goals: Good  OT Frequency: Min 2X/week    Co-evaluation PT/OT/SLP Co-Evaluation/Treatment: Yes Reason for  Co-Treatment: Complexity of the patient's impairments (multi-system involvement);For patient/therapist safety;Necessary to address cognition/behavior during functional activity   OT goals addressed during session: ADL's and self-care      AM-PAC OT "6 Clicks" Daily Activity     Outcome Measure Help from another person eating meals?: A Little Help from another person taking care of personal grooming?: A Little Help from another person toileting, which includes using toliet, bedpan, or urinal?: Total Help from another person bathing (including washing, rinsing, drying)?: A Lot Help from another person to put on and taking off regular upper body clothing?: A Lot Help from another person to put on and taking off regular lower body clothing?: Total 6 Click Score: 12   End of Session Nurse Communication: Mobility status;Other (comment) (MASD)  Activity Tolerance: Other (comment) (limited by dizziness with movement) Patient left: in bed;with call bell/phone within reach  OT Visit Diagnosis: Unsteadiness on feet (R26.81);Other abnormalities of gait and mobility (R26.89);Muscle weakness (generalized) (M62.81);Low vision, both eyes (H54.2);Other symptoms and signs involving the nervous system (R29.898);Other symptoms and signs involving cognitive function;Dizziness and giddiness (R42)                Time: 1027-1100 OT Time Calculation (min): 33 min Charges:  OT General Charges $OT Visit: 1 Visit OT Evaluation $OT Eval Moderate Complexity: [place], OT/L   Acute OT Clinical Specialist Acute Rehabilitation Services Pager [phone] Office [phone]   [facility] 04/07/2021, 12:07 PM

## 2021-04-07 NOTE — Evaluation (Signed)
Physical Therapy Evaluation Patient Details Name: Rhonda Winters MRN: [id number] DOB: Nov 01, 1945 Today's Date: 04/07/2021  History of Present Illness  Pt is a 17 who presented due to concerns of confusion, hallucinations and weakness.Workup underway for UTI. PMH: DM, HTN, large R PCA (occipital, temproal and hippocampus). Prolonged hospital stay in November 2022-02/12/2021 for acute encephalopathy secondary to pneumonia (required intubation for pneumonia, had bacteremia, tracheostomy)  [facility] then [facility] [place] [facility] and has been home for 2 weeks and receiving HHOT/PT.  Clinical Impression  Pt admitted secondary to problem above with deficits below. Pt hallucinating throughout session, but redirectable. Pt reporting increased dizziness with mobility and with heavy posterior lean in sitting. Required max to total A +2 for bed mobility tasks. Noted increased redness and maceration on bottom; RN notified. Feel pt would benefit from SNF level therapies at d/c to address current deficits. Will continue to follow acutely.      Recommendations for follow up therapy are one component of a multi-disciplinary discharge planning process, led by the attending physician.  Recommendations may be updated based on patient status, additional functional criteria and insurance authorization.  Follow Up Recommendations Skilled nursing-short term rehab (<3 hours/day)    Assistance Recommended at Discharge Frequent or constant Supervision/Assistance  Patient can return home with the following  Two people to help with walking and/or transfers;Two people to help with bathing/dressing/bathroom;Help with stairs or ramp for entrance;Assist for transportation;Assistance with cooking/housework;Direct supervision/assist for financial management;Direct supervision/assist for medications management    Equipment Recommendations Hospital bed  (hoyer lift)  Recommendations for Other Services       Functional Status Assessment Patient has had a recent decline in their functional status and demonstrates the ability to make significant improvements in function in a reasonable and predictable amount of time.     Precautions / Restrictions Precautions Precautions: Fall Precaution Comments: excoriatead skin; MASD; L field cut Restrictions Weight Bearing Restrictions: No      Mobility  Bed Mobility Overal bed mobility: Needs Assistance Bed Mobility: Rolling, Supine to Sit, Sit to Supine Rolling: Min assist, +2 for safety/equipment   Supine to sit: Max assist, +2 for physical assistance Sit to supine: Total assist, +2 for physical assistance   General bed mobility comments: when moving toward EOB, pt anxious and unable to achieve full upright posture due to pushing posterioroly; complaining of dizziness and feeling nauseated    Transfers                   General transfer comment: unable to stand at this time    Ambulation/Gait                  Stairs            Wheelchair Mobility    Modified Rankin (Stroke Patients Only)       Balance Overall balance assessment: Needs assistance   Sitting balance-Leahy Scale: Zero                                       Pertinent Vitals/Pain Pain Assessment Pain Assessment: Faces Faces Pain Scale: Hurts little more Pain Location: discomfort with bed mobility Pain Descriptors / Indicators: Grimacing Pain Intervention(s): Limited activity within patient's tolerance, Monitored during session, Repositioned    Home Living Family/patient expects to be discharged to:: Private residence  Living Arrangements: Spouse/significant other Available Help at Discharge: Family Type of Home: House Home Access: Stairs to enter Entrance Stairs-Rails: Left Entrance Stairs-Number of Steps: 6   Home Layout: One level Home Equipment: Cane - single  point;Shower seat;Grab bars - tub/shower;Grab bars - toilet;Rolling Environmental consultant (2 wheels);BSC/3in1;Wheelchair Probation officer (4 wheels)      Prior Function Prior Level of Function : Patient poor historian/Family not available;Needs assist             Mobility Comments: Using RW for mobility tasks ADLs Comments: Was working with HHOT; staes she was using her RW and was able to bath/dress herself     Hand Dominance   Dominant Hand: Right    Extremity/Trunk Assessment   Upper Extremity Assessment Upper Extremity Assessment: Defer to OT evaluation LUE Deficits / Details: apparent motor planning difficulties    Lower Extremity Assessment Lower Extremity Assessment: LLE deficits/detail LLE Deficits / Details: Motor planning difficulties    Cervical / Trunk Assessment Cervical / Trunk Assessment: Other exceptions (posterior bias)  Communication      Cognition Arousal/Alertness: Awake/alert Behavior During Therapy: Restless Overall Cognitive Status: Impaired/Different from baseline Area of Impairment: Attention, Safety/judgement, Awareness, Problem solving                   Current Attention Level: Sustained     Safety/Judgement: Decreased awareness of safety, Decreased awareness of deficits Awareness: Emergent Problem Solving: Slow processing, Difficulty sequencing, Requires verbal cues, Requires tactile cues General Comments: Did not know what day of the week it was, but was able to report the date. Pt talking to people in the room prior to entry that were not there.Having visual halluciantions while therapists were in the room; Difficulty with problem solving and appaent sensory motor deificits        General Comments General comments (skin integrity, edema, etc.): MASD with excoriated areas on  buttocks and around vaginal area; incontinenet of BM; skin cleaned and moisture barrier cream applied - nsg made aware    Exercises     Assessment/Plan    PT  Assessment Patient needs continued PT services  PT Problem List Decreased strength;Decreased balance;Decreased activity tolerance;Decreased mobility;Decreased cognition;Decreased knowledge of use of DME;Decreased safety awareness;Decreased knowledge of precautions       PT Treatment Interventions DME instruction;Gait training;Functional mobility training;Therapeutic exercise;Therapeutic activities;Neuromuscular re-education;Balance training;Patient/family education;Cognitive remediation    PT Goals (Current goals can be found in the Care Plan section)  Acute Rehab PT Goals PT Goal Formulation: Patient unable to participate in goal setting Time For Goal Achievement: 04/21/21 Potential to Achieve Goals: Fair    Frequency Min 2X/week     Co-evaluation PT/OT/SLP Co-Evaluation/Treatment: Yes Reason for Co-Treatment: Complexity of the patient's impairments (multi-system involvement);To address functional/ADL transfers PT goals addressed during session: Mobility/safety with mobility;Balance OT goals addressed during session: ADL's and self-care       AM-PAC PT "6 Clicks" Mobility  Outcome Measure Help needed turning from your back to your side while in a flat bed without using bedrails?: A Little Help needed moving from lying on your back to sitting on the side of a flat bed without using bedrails?: Total Help needed moving to and from a bed to a chair (including a wheelchair)?: Total Help needed standing up from a chair using your arms (e.g., wheelchair or bedside chair)?: Total Help needed to walk in hospital room?: Total Help needed climbing 3-5 steps with a railing? : Total 6 Click Score: 8    End of  Session Equipment Utilized During Treatment: Gait belt Activity Tolerance: Treatment limited secondary to medical complications (Comment) (dizziness) Patient left: in bed;with call bell/phone within reach (on stretcher in ED) Nurse Communication: Mobility status PT Visit Diagnosis:  Unsteadiness on feet (R26.81);Muscle weakness (generalized) (M62.81);Difficulty in walking, not elsewhere classified (R26.2)    Time: 1028-1100 PT Time Calculation (min) (ACUTE ONLY): 32 min   Charges:   PT Evaluation $PT Eval Moderate Complexity: 1 Mod          Reuel Derby, PT, DPT  Acute Rehabilitation Services  Pager: [phone] Office: [phone]   Rudean Hitt 04/07/2021, 1:58 PM

## 2021-04-07 NOTE — ED Notes (Signed)
Breakfast Orders Placed °

## 2021-04-07 NOTE — ED Notes (Signed)
PT at bedside.

## 2021-04-07 NOTE — ED Notes (Signed)
Patient noted to be speaking to people not in the room. Patient remains in bed and calm at this time.

## 2021-04-08 ENCOUNTER — Encounter (HOSPITAL_COMMUNITY): Payer: Self-pay | Admitting: Internal Medicine

## 2021-04-08 LAB — CBC
HCT: 32.8 % — ABNORMAL LOW (ref 36.0–46.0)
Hemoglobin: 10.3 g/dL — ABNORMAL LOW (ref 12.0–15.0)
MCH: 30.2 pg (ref 26.0–34.0)
MCHC: 31.4 g/dL (ref 30.0–36.0)
MCV: 96.2 fL (ref 80.0–100.0)
Platelets: 288 10*3/uL (ref 150–400)
RBC: 3.41 MIL/uL — ABNORMAL LOW (ref 3.87–5.11)
RDW: 17 % — ABNORMAL HIGH (ref 11.5–15.5)
WBC: 7.1 10*3/uL (ref 4.0–10.5)
nRBC: 0 % (ref 0.0–0.2)

## 2021-04-08 LAB — COMPREHENSIVE METABOLIC PANEL
ALT: 13 U/L (ref 0–44)
AST: 13 U/L — ABNORMAL LOW (ref 15–41)
Albumin: 2.8 g/dL — ABNORMAL LOW (ref 3.5–5.0)
Alkaline Phosphatase: 54 U/L (ref 38–126)
Anion gap: 12 (ref 5–15)
BUN: 7 mg/dL — ABNORMAL LOW (ref 8–23)
CO2: 30 mmol/L (ref 22–32)
Calcium: 9.8 mg/dL (ref 8.9–10.3)
Chloride: 101 mmol/L (ref 98–111)
Creatinine, Ser: 0.71 mg/dL (ref 0.44–1.00)
GFR, Estimated: >60 mL/min (ref >60)
Glucose, Bld: 89 mg/dL (ref 70–99)
Potassium: 2.6 mmol/L — CL (ref 3.5–5.1)
Sodium: 143 mmol/L (ref 135–145)
Total Bilirubin: 0.5 mg/dL (ref 0.3–1.2)
Total Protein: 5.5 g/dL — ABNORMAL LOW (ref 6.5–8.1)

## 2021-04-08 LAB — GLUCOSE, CAPILLARY
Glucose-Capillary: 105 mg/dL — ABNORMAL HIGH (ref 70–99)
Glucose-Capillary: 125 mg/dL — ABNORMAL HIGH (ref 70–99)
Glucose-Capillary: 118 mg/dL — ABNORMAL HIGH (ref 70–99)
Glucose-Capillary: 127 mg/dL — ABNORMAL HIGH (ref 70–99)

## 2021-04-08 MED ORDER — POTASSIUM CHLORIDE CRYS ER 20 MEQ PO TBCR
40.0000 meq | EXTENDED_RELEASE_TABLET | Freq: Once | ORAL | Status: AC
  Administered 2021-04-08: 40 meq via ORAL
  Filled 2021-04-08: qty 2

## 2021-04-08 MED ORDER — POTASSIUM CHLORIDE 20 MEQ PO PACK
40.0000 meq | PACK | Freq: Two times a day (BID) | ORAL | Status: DC
  Administered 2021-04-08: 40 meq via ORAL
  Filled 2021-04-08: qty 2

## 2021-04-08 NOTE — Progress Notes (Signed)
PROGRESS NOTE    Rhonda Winters  PFX:[id number] DOB: 1945-03-11 DOA: 04/06/2021 PCP: Curly Rim, MD   Brief Narrative:  Rhonda Winters is a 76 y.o. female with medical history significant of T2DM, hx of CVA, HTN, HLD, hypothyroidism, who presented to ED for concerns for hallucinations over the past 2 days and increased weakness.  She remains oriented x4 at baseline but continues to hallucinate that family members and friends are at bedside.  No recent trauma, falls, infectious process, medication changes.  There is some question about sleep difficulties, initially patient reports she has "not slept in 2 days" but then retracts and states that she "sleeps just fine".  Had prolonged hospital stay in November 2022-02/12/2021 for acute encephalopathy secondary to pneumonia and [facility] She then [facility] [place] [facility] and has been home for 2 approximately weeks.  Assessment & Plan:   Acute onset visual hallucinations; without auditory hallucinations, resolving Likely in the setting of sundowning/delirium due to poor sleep regimen and polypharmacy -Patient remains awake alert oriented today does not see any further individuals other than myself and nurse at bedside -Appears to be back to baseline, she is quite concerned about her previous hallucinations and we discussed given her poor sleep hygiene and multiple medications that were held at intake this is likely a multifactorial process -We will continue to hold Cymbalta and Elavil  -Continue lower dose gabapentin at 300 mg 3 times daily -Continue to treat for possible UTI but again patient declines any urinary symptoms, could be subclinical as below   Borderline QT interval - (present on admission) Borderline qt: QTc: 497 Hold cymbalta and elavil   Questionable UTI (urinary tract infection) Continue ceftriaxone x3 days, follow urine culture Patient remains at  baseline, refuses symptoms, continue antibiotics for 3 days to ensure resolution of possible but less likely UTI   Weakness and ambulatory dysfunction likely subacute on chronic -Patient had recently graduated from SNF facility after prolonged hospitalization and rehab as outlined above  -He has begun to decline over the past 2 weeks - unclear if related to above  -PT OT to follow - currently recommending SNF  Questionable history of afib? - (present on admission) -Continues on Eliquis, atenolol, and amiodarone -Eliquis and amiodarone are new medications -Family is not aware of any cardiology follow-up although May 2022 patient does have loop recorder placed -this is likely followed by cardiology at outside facility given no records in our system   History of CVA (cerebrovascular accident) Continue eliquis Head CT negative   HTN (hypertension)- (present on admission) Continue cozaar, hctz and atenolol    Normocytic anemia, likely of chronic disease Iron studies unremarkable Trend cbc    T2DM (type 2 diabetes mellitus) () a1c was 6.7 in 01/2021 Hold metformin SSI and accuchecks per protocol    Hypothyroidism TSH wnl today Continue synthroid 139mcg    DVT Prophylaxis: eliquis  CODE STATUS:   Code Status: Full Code  Consults: none Family Communication: husband at bedside: Rhonda Winters   Status is: Inpatient  Dispo: The patient is from: Home              Anticipated d/c is to: To be determined - likely SNF per PT              Anticipated d/c date is: 48 to 72 hours              Patient currently  not medically stable for discharge  Consultants:  None  Procedures:  None  Antimicrobials:  Ceftriaxone x3 days  Subjective: No acute issues or events overnight, no longer hallucinating, appears to be back to baseline although still weak with ongoing ambulatory dysfunction.  Lengthy discussion about possible need to transfer back to rehab for ongoing physical therapy.   Denies nausea vomiting diarrhea constipation headache fevers chills urinary hesitancy frequency or urgency.  Objective: Vitals:   04/07/21 2353 04/08/21 0005 04/08/21 0355 04/08/21 0600  BP:  (!) 151/78 (!) 149/91   Pulse: 69 70 77   Resp: 19 19 19    Temp: 99.1 F (37.3 C) 98.4 F (36.9 C) 99 F (37.2 C)   TempSrc: Oral Oral Oral   SpO2: 91% 90% 95%   Weight:    90.4 kg  Height:        Intake/Output Summary (Last 24 hours) at 04/08/2021 0721 Last data filed at 04/08/2021 1655 Gross per 24 hour  Intake 293 ml  Output 850 ml  Net -557 ml    Filed Weights   04/06/21 1252 04/08/21 0600  Weight: 106 kg 90.4 kg    Examination:  General:  Pleasantly resting in bed, No acute distress.  Alert to person place and situation HEENT:  Normocephalic atraumatic.  Sclerae nonicteric, noninjected.  Extraocular movements intact bilaterally. Neck:  Without mass or deformity.  Trachea is midline. Lungs:  Clear to auscultate bilaterally without rhonchi, wheeze, or rales. Heart:  Regular rate and rhythm.  Without murmurs, rubs, or gallops. Abdomen:  Soft, nontender, nondistended.  Without guarding or rebound. Extremities: Without cyanosis, clubbing, edema, or obvious deformity. Vascular:  Dorsalis pedis and posterior tibial pulses palpable bilaterally. Skin:  Warm and dry, no erythema, no ulcerations.   Data Reviewed: I have personally reviewed following labs and imaging studies  CBC: Recent Labs  Lab 04/06/21 1323 04/07/21 0449 04/08/21 0209  WBC 7.4 4.9 7.1  NEUTROABS 4.3  --   --   HGB 10.5* 10.3* 10.3*  HCT 35.0* 33.8* 32.8*  MCV 100.0 99.4 96.2  PLT 277 254 374    Basic Metabolic Panel: Recent Labs  Lab 04/06/21 1255 04/07/21 0449 04/08/21 0209  NA 141 141 143  K 3.6 3.1* 2.6*  CL 102 101 101  CO2 31 31 30   GLUCOSE 101* 94 89  BUN 9 7* 7*  CREATININE 0.75 0.66 0.71  CALCIUM 9.2 9.6 9.8  MG  --  1.3*  --     GFR: Estimated Creatinine Clearance: 63.5 mL/min (by  C-G formula based on SCr of 0.71 mg/dL). Liver Function Tests: Recent Labs  Lab 04/06/21 1255 04/08/21 0209  AST 25 13*  ALT 14 13  ALKPHOS 57 54  BILITOT 1.1 0.5  PROT 5.1* 5.5*  ALBUMIN 2.7* 2.8*    No results for input(s): LIPASE, AMYLASE in the last 168 hours. Recent Labs  Lab 04/06/21 1238  AMMONIA 27    Coagulation Profile: No results for input(s): INR, PROTIME in the last 168 hours. Cardiac Enzymes: No results for input(s): CKTOTAL, CKMB, CKMBINDEX, TROPONINI in the last 168 hours. BNP (last 3 results) No results for input(s): PROBNP in the last 8760 hours. HbA1C: No results for input(s): HGBA1C in the last 72 hours. CBG: Recent Labs  Lab 04/06/21 2218 04/07/21 0822 04/07/21 1207 04/07/21 2217  GLUCAP 90 96 112* 92    Lipid Profile: No results for input(s): CHOL, HDL, LDLCALC, TRIG, CHOLHDL, LDLDIRECT in the last 72 hours. Thyroid Function Tests: Recent  Labs    04/06/21 1255  TSH 1.386    Anemia Panel: Recent Labs    04/07/21 0449  VITAMINB12 406  FERRITIN 127  TIBC 266  IRON 60    Sepsis Labs: Recent Labs  Lab 04/06/21 1255 04/06/21 1300  LATICACIDVEN 2.3* 1.7     Recent Results (from the past 240 hour(s))  Resp Panel by RT-PCR (Flu A&B, Covid) Nasopharyngeal Swab     Status: None   Collection Time: 04/06/21 12:49 PM   Specimen: Nasopharyngeal Swab; Nasopharyngeal(NP) swabs in vial transport medium  Result Value Ref Range Status   SARS Coronavirus 2 by RT PCR NEGATIVE NEGATIVE Final    Comment: (NOTE) SARS-CoV-2 target nucleic acids are NOT DETECTED.  The SARS-CoV-2 RNA is generally detectable in upper respiratory specimens during the acute phase of infection. The lowest concentration of SARS-CoV-2 viral copies this assay can detect is 138 copies/mL. A negative result does not preclude SARS-Cov-2 infection and should not be used as the sole basis for treatment or other patient management decisions. A negative result may occur  with  improper specimen collection/handling, submission of specimen other than nasopharyngeal swab, presence of viral mutation(s) within the areas targeted by this assay, and inadequate number of viral copies(<138 copies/mL). A negative result must be combined with clinical observations, patient history, and epidemiological information. The expected result is Negative.  Fact Sheet for Patients:  EntrepreneurPulse.com.au  Fact Sheet for Healthcare Providers:  IncredibleEmployment.be  This test is no t yet approved or cleared by the [place] FDA and  has been authorized for detection and/or diagnosis of SARS-CoV-2 by FDA under an Emergency Use Authorization (EUA). This EUA will remain  in effect (meaning this test can be used) for the duration of the COVID-19 declaration under Section 564(b)(1) of the Act, 21 U.S.C.section 360bbb-3(b)(1), unless the authorization is terminated  or revoked sooner.       Influenza A by PCR NEGATIVE NEGATIVE Final   Influenza B by PCR NEGATIVE NEGATIVE Final    Comment: (NOTE) The Xpert Xpress SARS-CoV-2/FLU/RSV plus assay is intended as an aid in the diagnosis of influenza from Nasopharyngeal swab specimens and should not be used as a sole basis for treatment. Nasal washings and aspirates are unacceptable for Xpert Xpress SARS-CoV-2/FLU/RSV testing.  Fact Sheet for Patients: EntrepreneurPulse.com.au  Fact Sheet for Healthcare Providers: IncredibleEmployment.be  This test is not yet approved or cleared by the [place] FDA and has been authorized for detection and/or diagnosis of SARS-CoV-2 by FDA under an Emergency Use Authorization (EUA). This EUA will remain in effect (meaning this test can be used) for the duration of the COVID-19 declaration under Section 564(b)(1) of the Act, 21 U.S.C. section 360bbb-3(b)(1), unless the authorization is terminated  or revoked.  Performed at [facility], [place] [address] [place], [place] [address]   Blood Cultures (routine x 2)     Status: None (Preliminary result)   Collection Time: 04/06/21  1:50 PM   Specimen: BLOOD RIGHT ARM  Result Value Ref Range Status   Specimen Description BLOOD RIGHT ARM  Final   Special Requests   Final    BOTTLES DRAWN AEROBIC AND ANAEROBIC Blood Culture adequate volume   Culture   Final    NO GROWTH < 24 HOURS Performed at [facility], [place] [address], [place], [place] [address]    Report Status PENDING  Incomplete     Radiology Studies: CT HEAD WO CONTRAST  Result Date: 04/06/2021 CLINICAL DATA:  Altered mental status.  Hallucinations. EXAM: CT HEAD WITHOUT CONTRAST TECHNIQUE: Contiguous axial images were obtained from the base of the skull through the vertex without intravenous contrast. RADIATION DOSE REDUCTION: This exam was performed according to the departmental dose-optimization program which includes automated exposure control, adjustment of the mA and/or kV according to patient size and/or use of iterative reconstruction technique. COMPARISON:  CT head without contrast 01/28/2021. MR head without contrast 02/04/2021 FINDINGS: Brain: Remote right PCA territory infarct again noted in the right occipital and medial temporal lobe. Mild atrophy and white matter changes are otherwise stable. Basal ganglia and insular ribbon are normal bilaterally. No acute infarct, hemorrhage, or mass lesion is present. The ventricles are of normal size. No significant extraaxial fluid collection is present. The brainstem and cerebellum are within normal limits. Vascular: No hyperdense vessel or unexpected calcification. Skull: Hyperostosis again noted. No acute or focal abnormalities are present. No significant extracranial soft tissue lesion is present. Sinuses/Orbits: The paranasal sinuses and mastoid air cells are clear. Bilateral lens replacements are noted. Globes and  orbits are otherwise unremarkable. IMPRESSION: 1. Stable remote right PCA territory infarct. 2. Stable atrophy and white matter disease. This likely reflects the sequela of chronic microvascular ischemia. 3. No acute intracranial abnormality or significant interval change. Electronically Signed   By: San Morelle M.D.   On: 04/06/2021 13:46   DG Chest Portable 1 View  Result Date: 04/06/2021 CLINICAL DATA:  Cough. EXAM: PORTABLE CHEST 1 VIEW COMPARISON:  February 24, 2021. FINDINGS: Stable cardiomegaly. Both lungs are clear. The visualized skeletal structures are unremarkable. IMPRESSION: No active disease. Electronically Signed   By: Marijo Conception M.D.   On: 04/06/2021 13:27    Scheduled Meds:  amiodarone  200 mg Oral Daily   apixaban  5 mg Oral BID   atenolol  50 mg Oral Daily   bethanechol  10 mg Oral Daily   gabapentin  300 mg Oral TID   hydrochlorothiazide  50 mg Oral Daily   insulin aspart  0-15 Units Subcutaneous TID WC   levothyroxine  125 mcg Oral QAC breakfast   losartan  50 mg Oral Daily   multivitamin with minerals  1 tablet Oral Daily   sodium chloride flush  3 mL Intravenous Q12H   thiamine  100 mg Oral Daily   Continuous Infusions:  sodium chloride     cefTRIAXone (ROCEPHIN)  IV 2 g (04/07/21 2032)    LOS: 1 day   Time spent: 4min  Seymone Forlenza C Athanasios Heldman, DO [facility]  If 7PM-7AM, please contact night-coverage [email or web address]  04/08/2021, 7:21 AM

## 2021-04-08 NOTE — NC FL2 (Signed)
[place] LEVEL OF CARE SCREENING TOOL     IDENTIFICATION  Patient Name: Rhonda Winters Birthdate: 1946-02-13 Sex: female Admission Date (Current Location): 04/06/2021  [facility] [place] Number:  Herbalist and Address:  [facility], [place] [address] [place], [place] [address]      Provider Number: [id number]  Attending Physician Name and Address:  Little Ishikawa, MD  Relative Name and Phone Number:       Current Level of Care: Hospital Recommended Level of Care: [place] Prior Approval Number:    Date Approved/Denied:   PASRR Number: [id number]  Discharge Plan: SNF    Current Diagnoses: Patient Active Problem List   Diagnosis Date Noted   Hallucination 04/07/2021   borderline QT interval  04/06/2021   Hallucinations, visual ? AMS  04/06/2021   History of CVA (cerebrovascular accident) 04/06/2021   UTI (urinary tract infection) 04/06/2021   Normocytic anemia 04/06/2021   Weakness 04/06/2021   questionable history of afib?  04/06/2021   Streptococcal pneumonia ([place])    Pneumonia 01/26/2021   Sepsis ([place]) 01/26/2021   CVA (cerebral vascular accident) ([place]) 06/30/2020   Stroke ([place]) 06/29/2020   HTN (hypertension) 12/26/2019   Diabetic neuropathy associated with type 2 diabetes mellitus ([place]) 12/26/2019   Chronic back pain 12/26/2019   T2DM (type 2 diabetes mellitus) ([place]) 12/26/2019   Hypokalemia 12/26/2019   Elevated LFTs 12/26/2019   Hypothyroidism 12/26/2019   DDD (degenerative disc disease), lumbar 05/17/2017   Hyperlipidemia with target LDL less than 130 06/12/2013   Depression 03/15/2011   GERD (gastroesophageal reflux disease) 03/15/2011   Restless legs 03/15/2011    Orientation RESPIRATION BLADDER Height & Weight     Self, Time, Place  Normal Incontinent Weight: 199 lb 4.7 oz (90.4 kg) Height:  5\' 2"  (157.5 cm)  BEHAVIORAL SYMPTOMS/MOOD NEUROLOGICAL BOWEL NUTRITION STATUS       Continent Diet (carb modified)  AMBULATORY STATUS COMMUNICATION OF NEEDS Skin   Extensive Assist Verbally Other (Comment) (moisture associated skin damage, labia and perineum)                       Personal Care Assistance Level of Assistance  Bathing, Feeding, Dressing Bathing Assistance: Maximum assistance Feeding assistance: Limited assistance Dressing Assistance: Maximum assistance     Functional Limitations Info  Sight Sight Info: Impaired (glasses)        SPECIAL CARE FACTORS FREQUENCY  PT (By licensed PT), OT (By licensed OT)     PT Frequency: 5x/wk OT Frequency: 5x/wk            Contractures Contractures Info: Not present    Additional Factors Info  Code Status, Allergies, Insulin Sliding Scale Code Status Info: Full Allergies Info: Hydrocodone-acetaminophen   Insulin Sliding Scale Info: see DC summary       Current Medications (04/08/2021):  This is the current hospital active medication list Current Facility-Administered Medications  Medication Dose Route Frequency Provider Last Rate Last Admin   0.9 %  sodium chloride infusion  250 mL Intravenous PRN Orma Flaming, MD       acetaminophen (TYLENOL) tablet 650 mg  650 mg Oral Q6H PRN Orma Flaming, MD   650 mg at 04/06/21 2226   Or   acetaminophen (TYLENOL) suppository 650 mg  650 mg Rectal Q6H PRN Orma Flaming, MD       amiodarone (PACERONE) tablet 200 mg  200 mg Oral Daily Orma Flaming,  MD   200 mg at 04/08/21 0814   apixaban (ELIQUIS) tablet 5 mg  5 mg Oral BID Orma Flaming, MD   5 mg at 04/08/21 0815   atenolol (TENORMIN) tablet 50 mg  50 mg Oral Daily Orma Flaming, MD   50 mg at 04/08/21 8032   bethanechol (URECHOLINE) tablet 10 mg  10 mg Oral Daily Heloise Purpura, RPH   10 mg at 04/08/21 1224   cefTRIAXone (ROCEPHIN) 2 g in sodium chloride 0.9 % 100 mL IVPB  2 g Intravenous Q24H Orma Flaming, MD 200 mL/hr at 04/07/21 2032 2 g at 04/07/21 2032   gabapentin (NEURONTIN) tablet 300 mg   300 mg Oral TID Orma Flaming, MD   300 mg at 04/08/21 0815   hydrochlorothiazide (HYDRODIURIL) tablet 50 mg  50 mg Oral Daily Orma Flaming, MD   50 mg at 04/08/21 8250   insulin aspart (novoLOG) injection 0-15 Units  0-15 Units Subcutaneous TID [facility] Orma Flaming, MD   2 Units at 04/08/21 1222   levothyroxine (SYNTHROID) tablet 125 mcg  125 mcg Oral QAC breakfast Orma Flaming, MD   125 mcg at 04/08/21 0370   losartan (COZAAR) tablet 50 mg  50 mg Oral Daily Orma Flaming, MD   50 mg at 04/08/21 4888   multivitamin with minerals tablet 1 tablet  1 tablet Oral Daily Heloise Purpura, RPH   1 tablet at 04/08/21 9169   potassium chloride SA (KLOR-CON M) CR tablet 40 mEq  40 mEq Oral Once Little Ishikawa, MD       sodium chloride flush (NS) 0.9 % injection 3 mL  3 mL Intravenous Q12H Orma Flaming, MD   3 mL at 04/08/21 0816   sodium chloride flush (NS) 0.9 % injection 3 mL  3 mL Intravenous PRN Orma Flaming, MD       thiamine tablet 100 mg  100 mg Oral Daily Orma Flaming, MD   100 mg at 04/08/21 4503     Discharge Medications: Please see discharge summary for a list of discharge medications.  Relevant Imaging Results:  Relevant Lab Results:   Additional Information SS#: [id number]  Geralynn Ochs, LCSW

## 2021-04-08 NOTE — TOC Initial Note (Signed)
Transition of Care [facility]) - Initial/Assessment Note    Patient Details  Name: ANALLELI GIERKE MRN: [id number] Date of Birth: May 12, 1945  [facility]) CM/SW Contact:    Geralynn Ochs, LCSW Phone Number: 04/08/2021, 11:26 AM  Clinical Narrative:        CSW spoke with patient's spouse, Nada Boozer, via phone to discuss recommendation for SNF. Spouse in agreement with rehab, but asking about AIR at [facility] as the patient was there recently and he loved it. CSW explained that patient may not qualify this time and it's not recommended, but could have [facility] review referral to determine. CSW contacted [facility] and sent referral to have them review for eligibility. CSW to follow.           Expected Discharge Plan: Skilled Nursing Facility Barriers to Discharge: Continued Medical Work up   Patient Goals and CMS Choice Patient states their goals for this hospitalization and ongoing recovery are:: patient unable to participate in goal setting, only oriented to self CMS Medicare.gov Compare Post Acute Care list provided to:: Patient Represenative (must comment) Choice offered to / list presented to : Spouse  Expected Discharge Plan and Services Expected Discharge Plan: [place] Acute Care Choice: IP Rehab, [place] Living arrangements for the past 2 months: Single Family Home                                      Prior Living Arrangements/Services Living arrangements for the past 2 months: Single Family Home Lives with:: Spouse Patient language and need for interpreter reviewed:: No Do you feel safe going back to the place where you live?: Yes      Need for Family Participation in Patient Care: Yes (Comment) Care giver support system in place?: Yes (comment) Current home services: DME, Home PT, Home OT Criminal Activity/Legal Involvement Pertinent to Current Situation/Hospitalization: No - Comment as needed  Activities of Daily  Living      Permission Sought/Granted Permission sought to share information with : Facility Sport and exercise psychologist, Family Supports Permission granted to share information with : Yes, Verbal Permission Granted  Share Information with NAME: Nada Boozer  Permission granted to share info w AGENCY: [facility]  Permission granted to share info w Relationship: Spouse     Emotional Assessment   Attitude/Demeanor/Rapport: Unable to Assess Affect (typically observed): Unable to Assess Orientation: : Oriented to Self Alcohol / Substance Use: Not Applicable Psych Involvement: No (comment)  Admission diagnosis:  Hallucinations [R44.3] Encephalopathy acute [G93.40] Acute cystitis without hematuria [N30.00] Altered mental status, unspecified altered mental status type [R41.82] Hallucination [R44.3] Patient Active Problem List   Diagnosis Date Noted   Hallucination 04/07/2021   borderline QT interval  04/06/2021   Hallucinations, visual ? AMS  04/06/2021   History of CVA (cerebrovascular accident) 04/06/2021   UTI (urinary tract infection) 04/06/2021   Normocytic anemia 04/06/2021   Weakness 04/06/2021   questionable history of afib?  04/06/2021   Streptococcal pneumonia ([place])    Pneumonia 01/26/2021   Sepsis ([place]) 01/26/2021   CVA (cerebral vascular accident) ([place]) 06/30/2020   Stroke ([place]) 06/29/2020   HTN (hypertension) 12/26/2019   Diabetic neuropathy associated with type 2 diabetes mellitus ([place]) 12/26/2019   Chronic back pain 12/26/2019   T2DM (type 2 diabetes mellitus) ([place]) 12/26/2019   Hypokalemia 12/26/2019   Elevated LFTs 12/26/2019   Hypothyroidism 12/26/2019  DDD (degenerative disc disease), lumbar 05/17/2017   Hyperlipidemia with target LDL less than 130 06/12/2013   Depression 03/15/2011   GERD (gastroesophageal reflux disease) 03/15/2011   Restless legs 03/15/2011   PCP:  Curly Rim, MD Pharmacy:   [facility] [id number] - [place] [address], [place] [address] Phone: [phone] Fax: [phone]  [facility] - [place], [place] [address] Phone: [phone] Fax: [phone]     Social Determinants of Health (SDOH) Interventions    Readmission Risk Interventions No flowsheet data found.

## 2021-04-09 LAB — COMPREHENSIVE METABOLIC PANEL
ALT: 12 U/L (ref 0–44)
AST: 13 U/L — ABNORMAL LOW (ref 15–41)
Albumin: 2.7 g/dL — ABNORMAL LOW (ref 3.5–5.0)
Alkaline Phosphatase: 50 U/L (ref 38–126)
Anion gap: 9 (ref 5–15)
BUN: 10 mg/dL (ref 8–23)
CO2: 32 mmol/L (ref 22–32)
Calcium: 9.7 mg/dL (ref 8.9–10.3)
Chloride: 102 mmol/L (ref 98–111)
Creatinine, Ser: 0.79 mg/dL (ref 0.44–1.00)
GFR, Estimated: >60 mL/min (ref >60)
Glucose, Bld: 100 mg/dL — ABNORMAL HIGH (ref 70–99)
Potassium: 3.3 mmol/L — ABNORMAL LOW (ref 3.5–5.1)
Sodium: 143 mmol/L (ref 135–145)
Total Bilirubin: 0.3 mg/dL (ref 0.3–1.2)
Total Protein: 5.5 g/dL — ABNORMAL LOW (ref 6.5–8.1)

## 2021-04-09 LAB — CBC
HCT: 34.1 % — ABNORMAL LOW (ref 36.0–46.0)
Hemoglobin: 10.3 g/dL — ABNORMAL LOW (ref 12.0–15.0)
MCH: 29.6 pg (ref 26.0–34.0)
MCHC: 30.2 g/dL (ref 30.0–36.0)
MCV: 98 fL (ref 80.0–100.0)
Platelets: 275 10*3/uL (ref 150–400)
RBC: 3.48 MIL/uL — ABNORMAL LOW (ref 3.87–5.11)
RDW: 17.2 % — ABNORMAL HIGH (ref 11.5–15.5)
WBC: 6.6 10*3/uL (ref 4.0–10.5)
nRBC: 0 % (ref 0.0–0.2)

## 2021-04-09 LAB — URINE CULTURE: Culture: >=100000 — AB

## 2021-04-09 LAB — GLUCOSE, CAPILLARY
Glucose-Capillary: 133 mg/dL — ABNORMAL HIGH (ref 70–99)
Glucose-Capillary: 111 mg/dL — ABNORMAL HIGH (ref 70–99)

## 2021-04-09 MED ORDER — POTASSIUM CHLORIDE CRYS ER 20 MEQ PO TBCR
20.0000 meq | EXTENDED_RELEASE_TABLET | Freq: Two times a day (BID) | ORAL | Status: DC
  Administered 2021-04-09: 20 meq via ORAL
  Filled 2021-04-09: qty 1

## 2021-04-09 MED ORDER — GABAPENTIN 600 MG PO TABS: 300.0000 mg | ORAL_TABLET | Freq: Three times a day (TID) | ORAL | 0 refills | Status: AC

## 2021-04-09 NOTE — Discharge Summary (Signed)
Physician Discharge Summary  Rhonda Winters TFT:[id number] DOB: Feb 03, 1946 DOA: 04/06/2021  PCP: Curly Rim, MD  Admit date: 04/06/2021 Discharge date: 04/09/2021  Admitted From: Home Disposition:  Home  Recommendations for Outpatient Follow-up:  Follow up with PCP in 1-2 weeks to discuss medication changes and hallucinations Please obtain BMP/CBC in one week  Home Health: Resume home health Equipment/Devices: No new equipment  Discharge Condition: Stable CODE STATUS: Full Diet recommendation: Regular diet as tolerated  Brief/Interim Summary: Rhonda Winters is a 76 y.o. female with medical history significant of T2DM, hx of CVA, HTN, HLD, hypothyroidism, who presented to ED for concerns for hallucinations over the past 2 days and increased weakness.    Patient was treated for questionable UTI as well as had multiple home medications discontinued including Cymbalta and Elavil with lower dose of gabapentin with marked improvement in symptoms.  Patient otherwise stable and agreeable for discharge home.  Close follow-up with PCP given recent medication changes otherwise resume home health and physical therapy at home with previous company.  Discharge Diagnoses:  No new Assessment & Plan notes have been filed under this hospital service since the last note was generated. Service: Hospitalist     Discharge Instructions  Discharge Instructions     Face-to-face encounter (required for Medicare/Medicaid patients)   Complete by: As directed    Resume home health   The encounter with the patient was in whole, or in part, for the following medical condition, which is the primary reason for home health care: Ambulatory dysfunction   I certify that, based on my findings, the following services are medically necessary home health services: Physical therapy   Reason for Medically Necessary Home Health Services:  Therapy- Personnel officer, Public librarian Skilled  Nursing- Change/Decline in Patient Status Therapy- Instruction on Safe use of Assistive Devices for ADLs     My clinical findings support the need for the above services: Unable to leave home safely without assistance and/or assistive device   Further, I certify that my clinical findings support that this patient is homebound due to: Unable to leave home safely without assistance   Home Health   Complete by: As directed    To provide the following care/treatments:  PT OT        Allergies as of 04/09/2021       Reactions   Hydrocodone-acetaminophen Nausea And Vomiting        Medication List     STOP taking these medications    amitriptyline 25 MG tablet Commonly known as: ELAVIL   Ampicillin-Sulbactam 3 g in sodium chloride 0.9 % 100 mL   DULoxetine 60 MG capsule Commonly known as: CYMBALTA   traMADol 50 MG tablet Commonly known as: ULTRAM       TAKE these medications    acetaminophen 500 MG tablet Commonly known as: TYLENOL Take 1,000 mg by mouth every 6 (six) hours as needed for moderate pain.   amiodarone 200 MG tablet Commonly known as: PACERONE Take 200 mg by mouth daily.   apixaban 5 MG Tabs tablet Commonly known as: ELIQUIS Take 5 mg by mouth 2 (two) times daily.   atenolol 50 MG tablet Commonly known as: TENORMIN Take 50 mg by mouth daily.   bethanechol 10 MG tablet Commonly known as: URECHOLINE Place 1 tablet (10 mg total) into feeding tube 3 (three) times daily. What changed:  how to take this when to take this   gabapentin 600 MG tablet Commonly  known as: NEURONTIN Take 0.5 tablets (300 mg total) by mouth 3 (three) times daily. What changed:  medication strength how much to take additional instructions   hydrochlorothiazide 50 MG tablet Commonly known as: HYDRODIURIL Take 50 mg by mouth daily.   levothyroxine 125 MCG tablet Commonly known as: SYNTHROID Take 125 mcg by mouth daily before breakfast.   losartan 50 MG  tablet Commonly known as: COZAAR Take 50 mg by mouth daily.   metFORMIN 500 MG 24 hr tablet Commonly known as: GLUCOPHAGE-XR Take 500 mg by mouth daily with breakfast.   Multi For Her 50+ Tabs Take 1 tablet by mouth daily.   thiamine 100 MG tablet Take 100 mg by mouth daily.   vitamin E 180 MG (400 UNITS) capsule Take 400 Units by mouth daily.        Allergies  Allergen Reactions   Hydrocodone-Acetaminophen Nausea And Vomiting    Consultations: None  Procedures/Studies: CT HEAD WO CONTRAST  Result Date: 04/06/2021 CLINICAL DATA:  Altered mental status.  Hallucinations. EXAM: CT HEAD WITHOUT CONTRAST TECHNIQUE: Contiguous axial images were obtained from the base of the skull through the vertex without intravenous contrast. RADIATION DOSE REDUCTION: This exam was performed according to the departmental dose-optimization program which includes automated exposure control, adjustment of the mA and/or kV according to patient size and/or use of iterative reconstruction technique. COMPARISON:  CT head without contrast 01/28/2021. MR head without contrast 02/04/2021 FINDINGS: Brain: Remote right PCA territory infarct again noted in the right occipital and medial temporal lobe. Mild atrophy and white matter changes are otherwise stable. Basal ganglia and insular ribbon are normal bilaterally. No acute infarct, hemorrhage, or mass lesion is present. The ventricles are of normal size. No significant extraaxial fluid collection is present. The brainstem and cerebellum are within normal limits. Vascular: No hyperdense vessel or unexpected calcification. Skull: Hyperostosis again noted. No acute or focal abnormalities are present. No significant extracranial soft tissue lesion is present. Sinuses/Orbits: The paranasal sinuses and mastoid air cells are clear. Bilateral lens replacements are noted. Globes and orbits are otherwise unremarkable. IMPRESSION: 1. Stable remote right PCA territory infarct.  2. Stable atrophy and white matter disease. This likely reflects the sequela of chronic microvascular ischemia. 3. No acute intracranial abnormality or significant interval change. Electronically Signed   By: San Morelle M.D.   On: 04/06/2021 13:46   DG Chest Portable 1 View  Result Date: 04/06/2021 CLINICAL DATA:  Cough. EXAM: PORTABLE CHEST 1 VIEW COMPARISON:  February 24, 2021. FINDINGS: Stable cardiomegaly. Both lungs are clear. The visualized skeletal structures are unremarkable. IMPRESSION: No active disease. Electronically Signed   By: Marijo Conception M.D.   On: 04/06/2021 13:27     Subjective: No acute issues or events overnight   Discharge Exam: Vitals:   04/09/21 0722 04/09/21 1144  BP: (!) 170/90 (!) 154/71  Pulse: (!) 108 61  Resp: 18 20  Temp: 97.9 F (36.6 C) 98.4 F (36.9 C)  SpO2: 98% 97%   Vitals:   04/08/21 2347 04/09/21 0329 04/09/21 0722 04/09/21 1144  BP: 131/65 (!) 161/91 (!) 170/90 (!) 154/71  Pulse: (!) 55 (!) 54 (!) 108 61  Resp: 18 16 18 20   Temp: 97.7 F (36.5 C) 98 F (36.7 C) 97.9 F (36.6 C) 98.4 F (36.9 C)  TempSrc: Oral  Oral Oral  SpO2: 99% 97% 98% 97%  Weight:      Height:        General: Pt is alert, awake,  not in acute distress Cardiovascular: RRR, S1/S2 +, no rubs, no gallops Respiratory: CTA bilaterally, no wheezing, no rhonchi Abdominal: Soft, NT, ND, bowel sounds + Extremities: no edema, no cyanosis    The results of significant diagnostics from this hospitalization (including imaging, microbiology, ancillary and laboratory) are listed below for reference.     Microbiology: Recent Results (from the past 240 hour(s))  Resp Panel by RT-PCR (Flu A&B, Covid) Nasopharyngeal Swab     Status: None   Collection Time: 04/06/21 12:49 PM   Specimen: Nasopharyngeal Swab; Nasopharyngeal(NP) swabs in vial transport medium  Result Value Ref Range Status   SARS Coronavirus 2 by RT PCR NEGATIVE NEGATIVE Final    Comment:  (NOTE) SARS-CoV-2 target nucleic acids are NOT DETECTED.  The SARS-CoV-2 RNA is generally detectable in upper respiratory specimens during the acute phase of infection. The lowest concentration of SARS-CoV-2 viral copies this assay can detect is 138 copies/mL. A negative result does not preclude SARS-Cov-2 infection and should not be used as the sole basis for treatment or other patient management decisions. A negative result may occur with  improper specimen collection/handling, submission of specimen other than nasopharyngeal swab, presence of viral mutation(s) within the areas targeted by this assay, and inadequate number of viral copies(<138 copies/mL). A negative result must be combined with clinical observations, patient history, and epidemiological information. The expected result is Negative.  Fact Sheet for Patients:  EntrepreneurPulse.com.au  Fact Sheet for Healthcare Providers:  IncredibleEmployment.be  This test is no t yet approved or cleared by the [place] FDA and  has been authorized for detection and/or diagnosis of SARS-CoV-2 by FDA under an Emergency Use Authorization (EUA). This EUA will remain  in effect (meaning this test can be used) for the duration of the COVID-19 declaration under Section 564(b)(1) of the Act, 21 U.S.C.section 360bbb-3(b)(1), unless the authorization is terminated  or revoked sooner.       Influenza A by PCR NEGATIVE NEGATIVE Final   Influenza B by PCR NEGATIVE NEGATIVE Final    Comment: (NOTE) The Xpert Xpress SARS-CoV-2/FLU/RSV plus assay is intended as an aid in the diagnosis of influenza from Nasopharyngeal swab specimens and should not be used as a sole basis for treatment. Nasal washings and aspirates are unacceptable for Xpert Xpress SARS-CoV-2/FLU/RSV testing.  Fact Sheet for Patients: EntrepreneurPulse.com.au  Fact Sheet for Healthcare  Providers: IncredibleEmployment.be  This test is not yet approved or cleared by the [place] FDA and has been authorized for detection and/or diagnosis of SARS-CoV-2 by FDA under an Emergency Use Authorization (EUA). This EUA will remain in effect (meaning this test can be used) for the duration of the COVID-19 declaration under Section 564(b)(1) of the Act, 21 U.S.C. section 360bbb-3(b)(1), unless the authorization is terminated or revoked.  Performed at [facility], [place] [address] [place], [place] [address]   Urine Culture     Status: Abnormal   Collection Time: 04/06/21  1:15 PM   Specimen: Urine, Clean Catch  Result Value Ref Range Status   Specimen Description URINE, CLEAN CATCH  Final   Special Requests   Final    NONE Performed at [facility], [place] [address] [place], [place] [address]    Culture >=100,000 COLONIES/mL KLEBSIELLA PNEUMONIAE (A)  Final   Report Status 04/09/2021 FINAL  Final   Organism ID, Bacteria KLEBSIELLA PNEUMONIAE (A)  Final      Susceptibility   Klebsiella pneumoniae - MIC*    AMPICILLIN RESISTANT Resistant  CEFAZOLIN <=4 SENSITIVE Sensitive     CEFEPIME <=0.12 SENSITIVE Sensitive     CEFTRIAXONE <=0.25 SENSITIVE Sensitive     CIPROFLOXACIN <=0.25 SENSITIVE Sensitive     GENTAMICIN <=1 SENSITIVE Sensitive     IMIPENEM <=0.25 SENSITIVE Sensitive     NITROFURANTOIN 64 INTERMEDIATE Intermediate     TRIMETH/SULFA <=20 SENSITIVE Sensitive     AMPICILLIN/SULBACTAM 4 SENSITIVE Sensitive     PIP/TAZO <=4 SENSITIVE Sensitive     * >=100,000 COLONIES/mL KLEBSIELLA PNEUMONIAE  Blood Cultures (routine x 2)     Status: None (Preliminary result)   Collection Time: 04/06/21  1:50 PM   Specimen: BLOOD RIGHT ARM  Result Value Ref Range Status   Specimen Description BLOOD RIGHT ARM  Final   Special Requests   Final    BOTTLES DRAWN AEROBIC AND ANAEROBIC Blood Culture adequate volume   Culture   Final    NO GROWTH 3  DAYS Performed at [facility], [address], [place], [place] [address]    Report Status PENDING  Incomplete     Labs: BNP (last 3 results) Recent Labs    01/30/21 0024  BNP 269.4*   Basic Metabolic Panel: Recent Labs  Lab 04/06/21 1255 04/07/21 0449 04/08/21 0209 04/09/21 0036  NA 141 141 143 143  K 3.6 3.1* 2.6* 3.3*  CL 102 101 101 102  CO2 31 31 30  32  GLUCOSE 101* 94 89 100*  BUN 9 7* 7* 10  CREATININE 0.75 0.66 0.71 0.79  CALCIUM 9.2 9.6 9.8 9.7  MG  --  1.3*  --   --    Liver Function Tests: Recent Labs  Lab 04/06/21 1255 04/08/21 0209 04/09/21 0036  AST 25 13* 13*  ALT 14 13 12   ALKPHOS 57 54 50  BILITOT 1.1 0.5 0.3  PROT 5.1* 5.5* 5.5*  ALBUMIN 2.7* 2.8* 2.7*   No results for input(s): LIPASE, AMYLASE in the last 168 hours. Recent Labs  Lab 04/06/21 1238  AMMONIA 27   CBC: Recent Labs  Lab 04/06/21 1323 04/07/21 0449 04/08/21 0209 04/09/21 0036  WBC 7.4 4.9 7.1 6.6  NEUTROABS 4.3  --   --   --   HGB 10.5* 10.3* 10.3* 10.3*  HCT 35.0* 33.8* 32.8* 34.1*  MCV 100.0 99.4 96.2 98.0  PLT 277 254 288 275   Cardiac Enzymes: No results for input(s): CKTOTAL, CKMB, CKMBINDEX, TROPONINI in the last 168 hours. BNP: Invalid input(s): POCBNP CBG: Recent Labs  Lab 04/08/21 1150 04/08/21 1558 04/08/21 2211 04/09/21 0609 04/09/21 1145  GLUCAP 125* 118* 127* 133* 111*   D-Dimer No results for input(s): DDIMER in the last 72 hours. Hgb A1c No results for input(s): HGBA1C in the last 72 hours. Lipid Profile No results for input(s): CHOL, HDL, LDLCALC, TRIG, CHOLHDL, LDLDIRECT in the last 72 hours. Thyroid function studies Recent Labs    04/06/21 1255  TSH 1.386   Anemia work up Recent Labs    04/07/21 0449  VITAMINB12 406  FERRITIN 127  TIBC 266  IRON 60   Urinalysis    Component Value Date/Time   COLORURINE YELLOW 04/06/2021 1315   APPEARANCEUR HAZY (A) 04/06/2021 1315   LABSPEC 1.010 04/06/2021 1315   PHURINE 7.5  04/06/2021 1315   GLUCOSEU NEGATIVE 04/06/2021 1315   HGBUR TRACE (A) 04/06/2021 [place] 04/06/2021 1315   [place] 04/06/2021 1315   PROTEINUR NEGATIVE 04/06/2021 1315   NITRITE NEGATIVE 04/06/2021 1315   LEUKOCYTESUR TRACE (  A) 04/06/2021 1315   Sepsis Labs Invalid input(s): PROCALCITONIN,  WBC,  LACTICIDVEN Microbiology Recent Results (from the past 240 hour(s))  Resp Panel by RT-PCR (Flu A&B, Covid) Nasopharyngeal Swab     Status: None   Collection Time: 04/06/21 12:49 PM   Specimen: Nasopharyngeal Swab; Nasopharyngeal(NP) swabs in vial transport medium  Result Value Ref Range Status   SARS Coronavirus 2 by RT PCR NEGATIVE NEGATIVE Final    Comment: (NOTE) SARS-CoV-2 target nucleic acids are NOT DETECTED.  The SARS-CoV-2 RNA is generally detectable in upper respiratory specimens during the acute phase of infection. The lowest concentration of SARS-CoV-2 viral copies this assay can detect is 138 copies/mL. A negative result does not preclude SARS-Cov-2 infection and should not be used as the sole basis for treatment or other patient management decisions. A negative result may occur with  improper specimen collection/handling, submission of specimen other than nasopharyngeal swab, presence of viral mutation(s) within the areas targeted by this assay, and inadequate number of viral copies(<138 copies/mL). A negative result must be combined with clinical observations, patient history, and epidemiological information. The expected result is Negative.  Fact Sheet for Patients:  EntrepreneurPulse.com.au  Fact Sheet for Healthcare Providers:  IncredibleEmployment.be  This test is no t yet approved or cleared by the [place] FDA and  has been authorized for detection and/or diagnosis of SARS-CoV-2 by FDA under an Emergency Use Authorization (EUA). This EUA will remain  in effect (meaning this test can be used)  for the duration of the COVID-19 declaration under Section 564(b)(1) of the Act, 21 U.S.C.section 360bbb-3(b)(1), unless the authorization is terminated  or revoked sooner.       Influenza A by PCR NEGATIVE NEGATIVE Final   Influenza B by PCR NEGATIVE NEGATIVE Final    Comment: (NOTE) The Xpert Xpress SARS-CoV-2/FLU/RSV plus assay is intended as an aid in the diagnosis of influenza from Nasopharyngeal swab specimens and should not be used as a sole basis for treatment. Nasal washings and aspirates are unacceptable for Xpert Xpress SARS-CoV-2/FLU/RSV testing.  Fact Sheet for Patients: EntrepreneurPulse.com.au  Fact Sheet for Healthcare Providers: IncredibleEmployment.be  This test is not yet approved or cleared by the [place] FDA and has been authorized for detection and/or diagnosis of SARS-CoV-2 by FDA under an Emergency Use Authorization (EUA). This EUA will remain in effect (meaning this test can be used) for the duration of the COVID-19 declaration under Section 564(b)(1) of the Act, 21 U.S.C. section 360bbb-3(b)(1), unless the authorization is terminated or revoked.  Performed at [facility], [place] [address] [place], [place] [address]   Urine Culture     Status: Abnormal   Collection Time: 04/06/21  1:15 PM   Specimen: Urine, Clean Catch  Result Value Ref Range Status   Specimen Description URINE, CLEAN CATCH  Final   Special Requests   Final    NONE Performed at [facility], [place] [address], [place], [place] [address]    Culture >=100,000 COLONIES/mL KLEBSIELLA PNEUMONIAE (A)  Final   Report Status 04/09/2021 FINAL  Final   Organism ID, Bacteria KLEBSIELLA PNEUMONIAE (A)  Final      Susceptibility   Klebsiella pneumoniae - MIC*    AMPICILLIN RESISTANT Resistant     CEFAZOLIN <=4 SENSITIVE Sensitive     CEFEPIME <=0.12 SENSITIVE Sensitive     CEFTRIAXONE <=0.25 SENSITIVE Sensitive     CIPROFLOXACIN <=0.25  SENSITIVE Sensitive     GENTAMICIN <=1 SENSITIVE Sensitive  IMIPENEM <=0.25 SENSITIVE Sensitive     NITROFURANTOIN 64 INTERMEDIATE Intermediate     TRIMETH/SULFA <=20 SENSITIVE Sensitive     AMPICILLIN/SULBACTAM 4 SENSITIVE Sensitive     PIP/TAZO <=4 SENSITIVE Sensitive     * >=100,000 COLONIES/mL KLEBSIELLA PNEUMONIAE  Blood Cultures (routine x 2)     Status: None (Preliminary result)   Collection Time: 04/06/21  1:50 PM   Specimen: BLOOD RIGHT ARM  Result Value Ref Range Status   Specimen Description BLOOD RIGHT ARM  Final   Special Requests   Final    BOTTLES DRAWN AEROBIC AND ANAEROBIC Blood Culture adequate volume   Culture   Final    NO GROWTH 3 DAYS Performed at [facility], [address] [place],  [address]    Report Status PENDING  Incomplete     Time coordinating discharge: Over 30 minutes  SIGNED:   Little Ishikawa, DO [facility] 04/09/2021, 12:43 PM Pager   If 7PM-7AM, please contact night-coverage [email or web address]

## 2021-04-09 NOTE — Progress Notes (Signed)
Physical Therapy Treatment Patient Details Name: Rhonda Winters MRN: [id number] DOB: [birth year] Today's Date: 04/09/2021   History of Present Illness Pt is a 73 who presented due to concerns of confusion, hallucinations and weakness.Workup underway for UTI. PMH: DM, HTN, large R PCA (occipital, temproal and hippocampus). Prolonged hospital stay in November 2022-02/12/2021 for acute encephalopathy secondary to pneumonia (required intubation for pneumonia, had bacteremia, tracheostomy)  [facility]. [facility] then [facility] [place] [facility] and has been home for 2 weeks and receiving HHOT/PT.    PT Comments    Focus of session today Bed mobility, transfers, and ambulation with RW patient tolerated well.  Pt. Shows overall improvement with cognition, functional mobility, and gait. Endurance, overall strength, and anxiety with new movements are still limiting function. Pt. Would benefit from skilled PT to continue to address her overall endurance, strength, and functional balance. At this time SPT believes she can be discharged to home with home health with continued support from her husband. Pt to follow acutely as appropriate.     Recommendations for follow up therapy are one component of a multi-disciplinary discharge planning process, led by the attending physician.  Recommendations may be updated based on patient status, additional functional criteria and insurance authorization.  Follow Up Recommendations  Home health PT     Assistance Recommended at Discharge Intermittent Supervision/Assistance  Patient can return home with the following A little help with walking and/or transfers;A little help with bathing/dressing/bathroom;Assist for transportation;Assistance with cooking/housework   Equipment Recommendations       Recommendations for Other Services       Precautions / Restrictions Precautions Precautions: Fall      Mobility  Bed Mobility Overal bed mobility: Needs Assistance Bed Mobility: Rolling, Supine to Sit, Sit to Supine Rolling: Min guard   Supine to sit: Min guard Sit to supine: Min guard   General bed mobility comments: Pt. was able to pefrom with sequencing cues Patient Response: Anxious  Transfers Overall transfer level: Needs assistance Equipment used: Standard walker Transfers: Sit to/from Stand Sit to Stand: Min guard           General transfer comment: Pt. stood right up once and then required min cueing the second time. Anxious about coming to stand. STS x3 pt. cued to control eccentric portion    Ambulation/Gait Ambulation/Gait assistance: Min guard Gait Distance (Feet): 70 Feet Assistive device: Standard walker Gait Pattern/deviations: Step-through pattern, Decreased stride length, Narrow base of support Gait velocity: decr     General Gait Details: Gait without LOB but pt. is anxious about walking and standing.   Stairs             Wheelchair Mobility    Modified Rankin (Stroke Patients Only)       Balance Overall balance assessment: Needs assistance Sitting-balance support: No upper extremity supported, Feet supported Sitting balance-Leahy Scale: Good     Standing balance support: Bilateral upper extremity supported Standing balance-Leahy Scale: Poor Standing balance comment: Pt. stood with walker since she hasn't been up in a couple weeks. Standing balance will likely improve well.                            Cognition Arousal/Alertness: Awake/alert Behavior During Therapy: WFL for tasks assessed/performed Overall Cognitive Status: Impaired/Different from baseline Area of Impairment: Attention, Problem solving  Current Attention Level: Selective     Safety/Judgement: Decreased awareness of safety, Decreased awareness of deficits   Problem Solving: Slow processing, Difficulty sequencing, Requires  verbal cues, Requires tactile cues General Comments: Pt. reports no incidents of hallucenations, very talkative, verbal cues to stay on task. Anxious to progress mobility.        Exercises General Exercises - Lower Extremity Ankle Circles/Pumps: AROM, Both, 20 reps Short Arc Quad: AROM, 10 reps, Both    General Comments        Pertinent Vitals/Pain Pain Assessment Pain Assessment: No/denies pain    Home Living                          Prior Function            PT Goals (current goals can now be found in the care plan section) Acute Rehab PT Goals PT Goal Formulation: With patient Time For Goal Achievement: 04/21/21 Potential to Achieve Goals: Good Progress towards PT goals: Progressing toward goals    Frequency    Min 3X/week      PT Plan Discharge plan needs to be updated    Co-evaluation              AM-PAC PT "6 Clicks" Mobility   Outcome Measure  Help needed turning from your back to your side while in a flat bed without using bedrails?: A Little Help needed moving from lying on your back to sitting on the side of a flat bed without using bedrails?: A Little Help needed moving to and from a bed to a chair (including a wheelchair)?: A Little Help needed standing up from a chair using your arms (e.g., wheelchair or bedside chair)?: A Little Help needed to walk in hospital room?: A Little Help needed climbing 3-5 steps with a railing? : A Little 6 Click Score: 18    End of Session Equipment Utilized During Treatment: Gait belt Activity Tolerance: Patient tolerated treatment well Patient left: in chair;with call bell/phone within reach;with chair alarm set Nurse Communication: Mobility status PT Visit Diagnosis: Unsteadiness on feet (R26.81);Muscle weakness (generalized) (M62.81);Difficulty in walking, not elsewhere classified (R26.2)     Time: 6045-4098 PT Time Calculation (min) (ACUTE ONLY): 20 min  Charges:  $Therapeutic Activity:  8-22 mins                     Thermon Leyland, SPT Acute Rehab Services    Thermon Leyland 04/09/2021, 2:01 PM

## 2021-04-09 NOTE — Progress Notes (Signed)
Talya L Kaluzny to be D/C'd home per MD order. Discussed with the patient and husband and all questions fully answered.  Skin clean and dry. IV catheter discontinued intact. Site without signs and symptoms of complications. Dressing and pressure applied.  An After Visit Summary was printed and given to the patient.  Patient escorted via [place], and D/C home via private auto.  Melonie Florida  04/09/2021 4:02 PM

## 2021-04-09 NOTE — Progress Notes (Signed)
Occupational Therapy Treatment Patient Details Name: Rhonda Winters MRN: [id number] DOB: [birth year] Today's Date: 04/09/2021   History of present illness Pt is a 66 who presented due to concerns of confusion, hallucinations and weakness.Workup underway for UTI. PMH: DM, HTN, large R PCA (occipital, temproal and hippocampus). Prolonged hospital stay in November 2022-02/12/2021 for acute encephalopathy secondary to pneumonia (required intubation for pneumonia, had bacteremia, tracheostomy)  [facility]. She then had re[facility] in [place] [facility] and has been home for 2 weeks and receiving HHOT/PT.   OT comments  Pt making incremental progress with OT goals. This session pt presenting with increased anxiety around all functional mobility. She was able to complete transfers and stairs with min assist for safety due to BLE weakness and VC for sequencing. OT updated recommendations to home health OT with 24/7 assist at home.    Recommendations for follow up therapy are one component of a multi-disciplinary discharge planning process, led by the attending physician.  Recommendations may be updated based on patient status, additional functional criteria and insurance authorization.    Follow Up Recommendations  Home health OT    Assistance Recommended at Discharge Frequent or constant Supervision/Assistance  Patient can return home with the following  Two people to help with walking and/or transfers;Two people to help with bathing/dressing/bathroom;Direct supervision/assist for financial management;Direct supervision/assist for medications management;Assistance with feeding;Assistance with cooking/housework;Assist for transportation;Help with stairs or ramp for entrance   Equipment Recommendations  None recommended by OT    Recommendations for Other Services      Precautions / Restrictions Precautions Precautions:  Fall Restrictions Weight Bearing Restrictions: No       Mobility Bed Mobility Overal bed mobility: Needs Assistance Bed Mobility: Rolling, Supine to Sit, Sit to Supine Rolling: Min guard   Supine to sit: Min guard Sit to supine: Min guard   General bed mobility comments: Pt. was able to pefrom with sequencing cues    Transfers Overall transfer level: Needs assistance Equipment used: Rolling walker (2 wheels) Transfers: Sit to/from Stand Sit to Stand: Min guard           General transfer comment: Pt requiring increased time and min guard due to pt anxiety with mobility     Balance Overall balance assessment: Needs assistance Sitting-balance support: No upper extremity supported, Feet supported Sitting balance-Leahy Scale: Good     Standing balance support: Bilateral upper extremity supported Standing balance-Leahy Scale: Poor Standing balance comment: Pt. stood with walker since she hasn't been up in a couple weeks. Standing balance will likely improve well.                           ADL either performed or assessed with clinical judgement   ADL Overall ADL's : Needs assistance/impaired             Lower Body Bathing: Maximal assistance Lower Body Bathing Details (indicate cue type and reason): in bed Upper Body Dressing : Minimal assistance;Sitting Upper Body Dressing Details (indicate cue type and reason): EOB doffing hospital gown Lower Body Dressing: Maximal assistance Lower Body Dressing Details (indicate cue type and reason): bed level Toilet Transfer: Min guard;Minimal assistance Toilet Transfer Details (indicate cue type and reason): stnad pivot, poor balance           General ADL Comments: Session focused on functional mobility and activity tolerance/strength with stairs    Extremity/Trunk Assessment  Vision       Perception     Praxis      Cognition Arousal/Alertness: Awake/alert Behavior During Therapy:  WFL for tasks assessed/performed Overall Cognitive Status: Impaired/Different from baseline Area of Impairment: Attention, Problem solving                   Current Attention Level: Selective     Safety/Judgement: Decreased awareness of safety, Decreased awareness of deficits   Problem Solving: Slow processing, Difficulty sequencing, Requires verbal cues, Requires tactile cues General Comments: Pt. reports no incidents of hallucenations, very talkative, verbal cues to stay on task. Anxious to progress mobility.        Exercises Exercises: Other exercises Other Exercises Other Exercises: 5 steps up and 5 steps down, with use of bilateral hand rails Other Exercises: Sit <>stands x6    Shoulder Instructions       General Comments VSS on RA, pt with anxiety during mobility    Pertinent Vitals/ Pain       Pain Assessment Pain Assessment: No/denies pain  Home Living                                          Prior Functioning/Environment              Frequency  Min 2X/week        Progress Toward Goals  OT Goals(current goals can now be found in the care plan section)  Progress towards OT goals: Progressing toward goals  Acute Rehab OT Goals Patient Stated Goal: To go home today OT Goal Formulation: With patient Time For Goal Achievement: 04/21/21 Potential to Achieve Goals: Good ADL Goals Pt Will Perform Grooming: with set-up;with supervision;sitting Pt Will Perform Upper Body Bathing: with set-up;with supervision;sitting Pt Will Transfer to Toilet: with mod assist;with +2 assist;bedside commode;stand pivot transfer Additional ADL Goal #1: Maintain mideline postural control EOB x 5 min with min A +2  Plan Discharge plan remains appropriate;Frequency remains appropriate    Co-evaluation                 AM-PAC OT "6 Clicks" Daily Activity     Outcome Measure   Help from another person eating meals?: A Little Help from  another person taking care of personal grooming?: A Little Help from another person toileting, which includes using toliet, bedpan, or urinal?: Total Help from another person bathing (including washing, rinsing, drying)?: A Lot Help from another person to put on and taking off regular upper body clothing?: A Lot Help from another person to put on and taking off regular lower body clothing?: Total 6 Click Score: 12    End of Session Equipment Utilized During Treatment: Gait belt;Rolling walker (2 wheels)  OT Visit Diagnosis: Unsteadiness on feet (R26.81);Other abnormalities of gait and mobility (R26.89);Muscle weakness (generalized) (M62.81);Low vision, both eyes (H54.2);Other symptoms and signs involving the nervous system (R29.898);Other symptoms and signs involving cognitive function;Dizziness and giddiness (R42)   Activity Tolerance Patient tolerated treatment well   Patient Left in bed;with call bell/phone within reach   Nurse Communication Mobility status        Time: 0998-3382 OT Time Calculation (min): 60 min  Charges: OT General Charges $OT Visit: 1 Visit OT Treatments $Self Care/Home Management : 8-22 mins $Therapeutic Activity: 38-52 mins  Jakaiden Fill H., OTR/L Acute Rehabilitation  Quantel Mcinturff Elane Pearly [place] 04/09/2021, 6:13 PM

## 2021-04-09 NOTE — Care Management Important Message (Signed)
Important Message  Patient Details  Name: EVANA RUNNELS MRN: [id number] Date of Birth: 12-23-1945   Medicare Important Message Given:  Yes     Hannah Beat 04/09/2021, 2:42 PM

## 2021-04-09 NOTE — TOC Transition Note (Signed)
Transition of Care [facility]) - CM/SW Discharge Note   Patient Details  Name: Rhonda Winters MRN: [id number] Date of Birth: Oct 18, 1945  Transition of Care [facility]) CM/SW Contact:  Geralynn Ochs, LCSW Phone Number: 04/09/2021, 2:19 PM   Clinical Narrative:   CSW updated by PT and OT that patient is doing well and can return home with resumption of home health. CSW spoke with spouse, Nada Boozer, who indicated that home health was through [place]. CSW confirmed with Latricia Heft, they will resume home health tomorrow. [facility] to come and provide transport to patient home. No other needs at this time.     Final next level of care: [place] Barriers to Discharge: Barriers Resolved   Patient Goals and CMS Choice Patient states their goals for this hospitalization and ongoing recovery are:: patient unable to participate in goal setting, only oriented to self CMS Medicare.gov Compare Post Acute Care list provided to:: Patient Represenative (must comment) Choice offered to / list presented to : Spouse  Discharge Placement                Patient to be transferred to facility by: Family car Name of family member notified: [facility] Patient and family notified of of transfer: 04/09/21  Discharge Plan and Services     Post Acute Care Choice: IP Rehab, Skilled Nursing Facility                    HH Arranged: PT, OT [facility] Agency: [place] Date [facility] Agency Contacted: 04/09/21   Representative spoke with at [place]: Amy  Social Determinants of Health ([place]) Interventions     Readmission Risk Interventions No flowsheet data found.

## 2021-04-11 LAB — CULTURE, BLOOD (ROUTINE X 2)
Culture: NO GROWTH
Special Requests: ADEQUATE

## 2021-07-16 ENCOUNTER — Ambulatory Visit (HOSPITAL_COMMUNITY)
Admission: EM | Admit: 2021-07-16 | Discharge: 2021-07-16 | Disposition: A | Discharge status: Home / Self Care | Payer: Medicare Other

## 2021-07-16 ENCOUNTER — Encounter (HOSPITAL_COMMUNITY): Payer: Self-pay | Admitting: Emergency Medicine

## 2021-07-16 NOTE — ED Notes (Signed)
Charted in error.

## 2021-12-22 LAB — HSV DNA BY PCR (REFERENCE LAB)
HSV 1 DNA: NEGATIVE
HSV 2 DNA: POSITIVE — AB

## 2022-05-01 IMAGING — CT CT HEAD W/O CM
3 of 5 series · 15 of 47 positions shown, 18 images · non-contrast
Comparison: CT head without contrast 01/28/2021. MR head without
contrast 02/04/2021

CLINICAL DATA: Altered mental status.  Hallucinations.



[Series 4: head 2.0 h70h · axial · 0.46mm/px · z∈[-128,+14]mm · 9 of 83 slices shown, 12 images]
[im 6/83  brain]
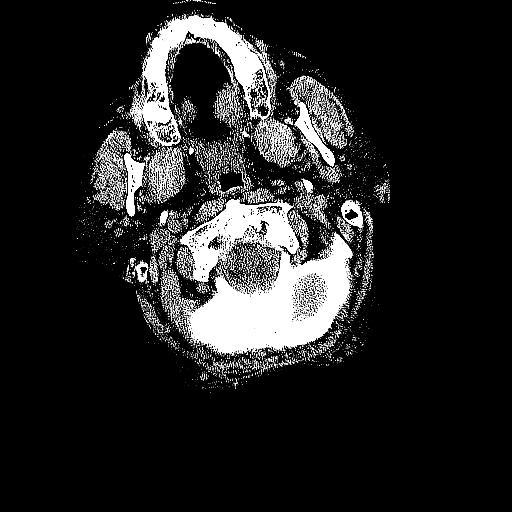
[im 6/83  bone]
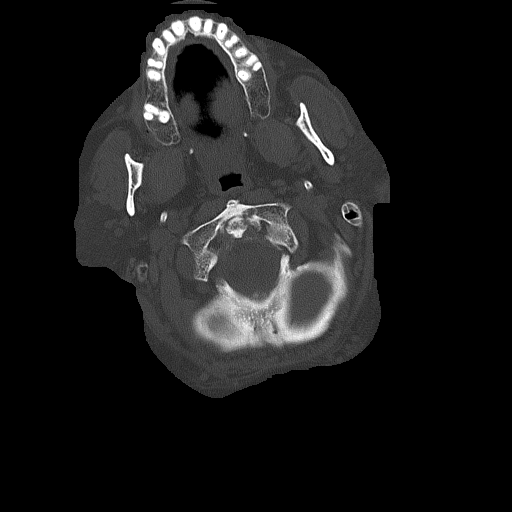
[im 16/83  brain]
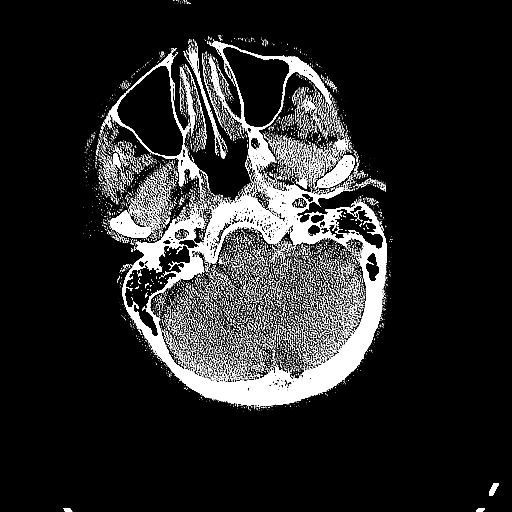
[im 26/83  brain]
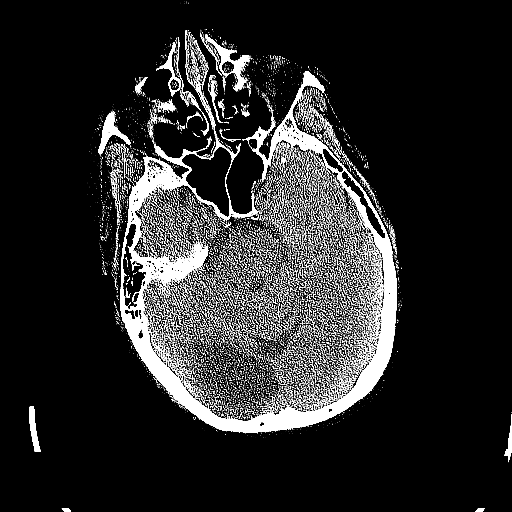
[im 31/83  brain]
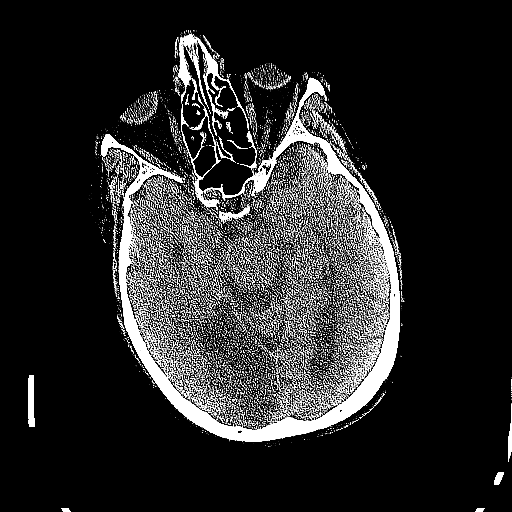
[im 42/83  brain]
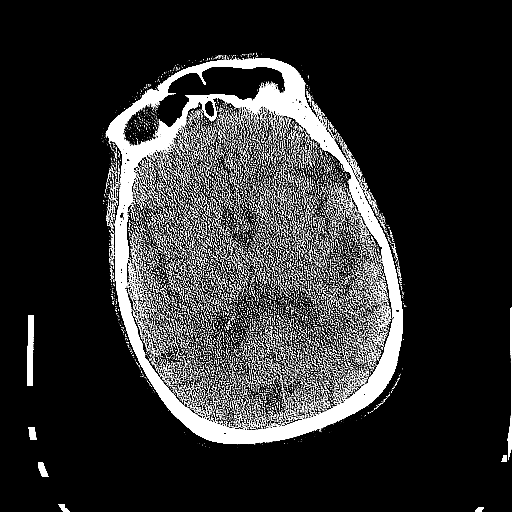
[im 42/83  bone]
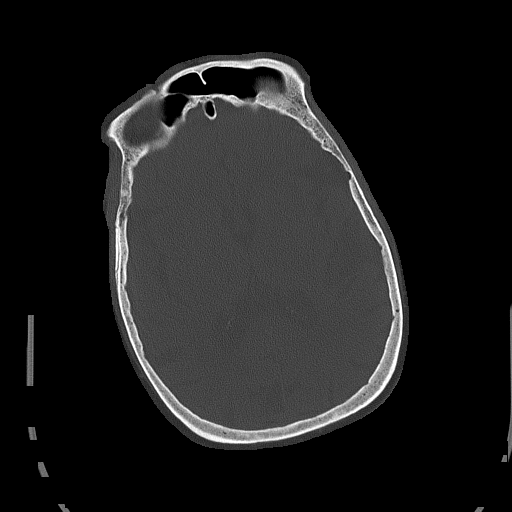
[im 52/83  brain]
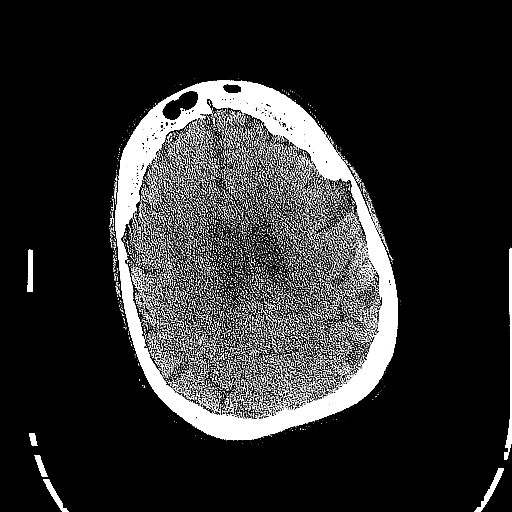
[im 57/83  brain]
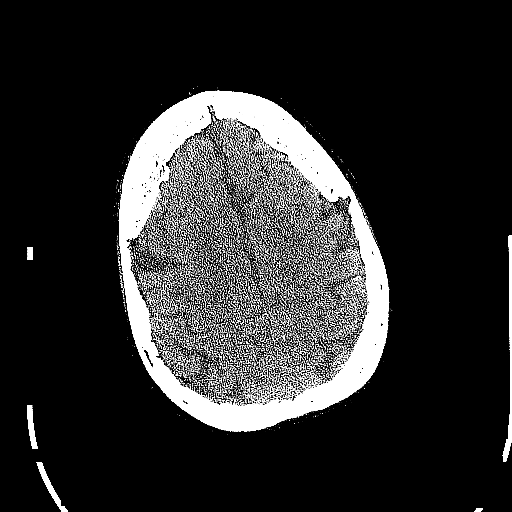
[im 67/83  brain]
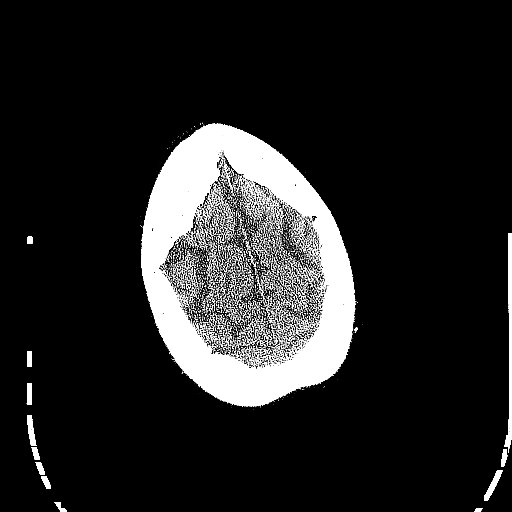
[im 77/83  brain]
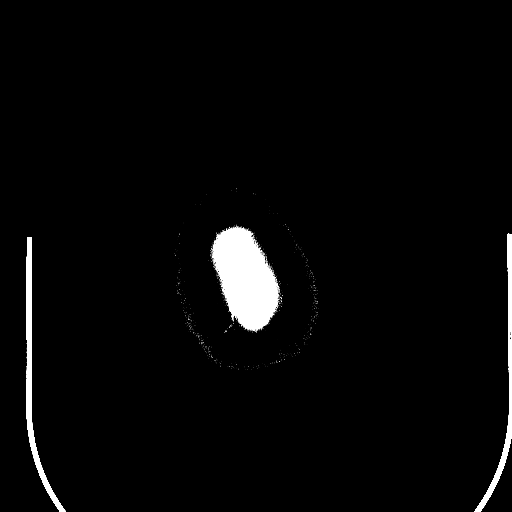
[im 77/83  bone]
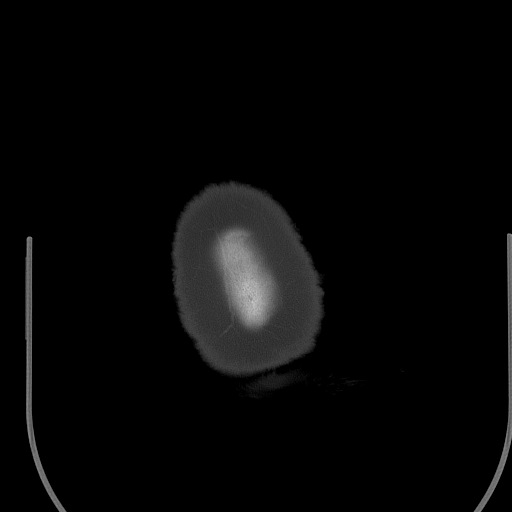

[Series 5: head 3.0 mpr cor · coronal · 0.33mm/px · 3 of 67 slices shown]
[im 23/67  brain]
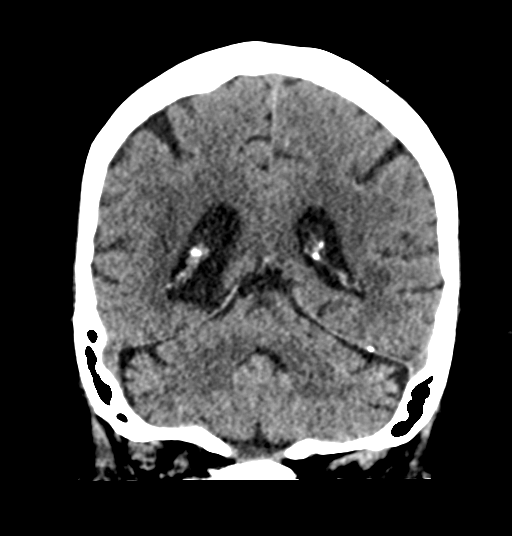
[im 30/67  brain]
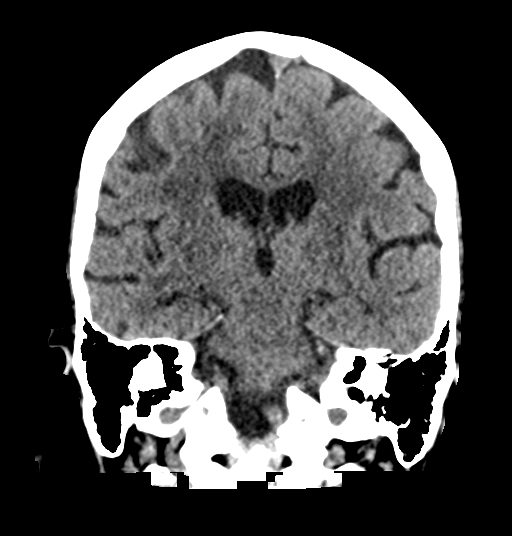
[im 37/67  brain]
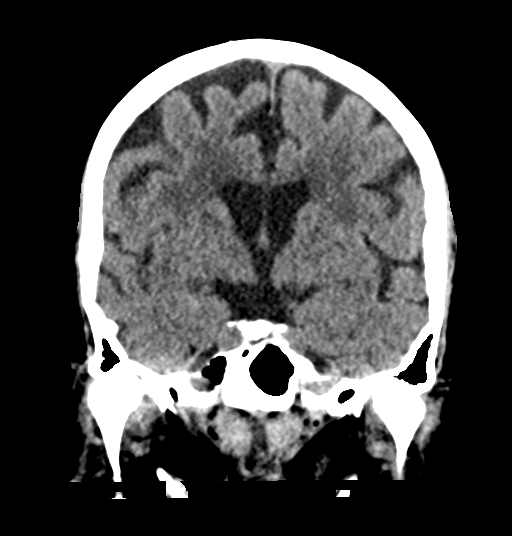

[Series 6: head 3.0 mpr sag · sagittal · 0.32mm/px · 3 of 54 slices shown]
[im 18/54  brain]
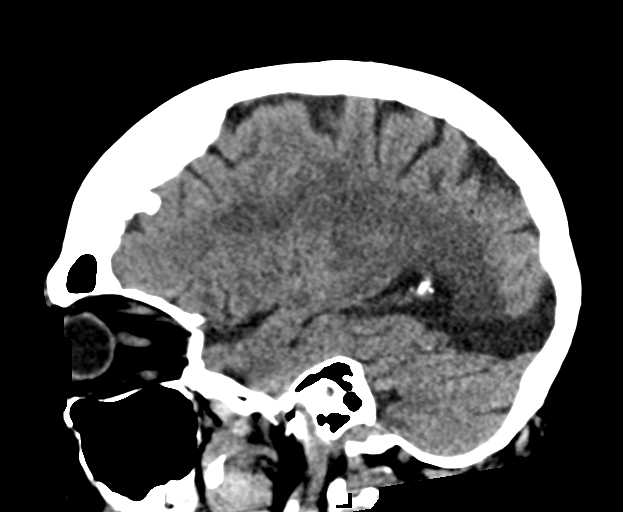
[im 27/54  brain]
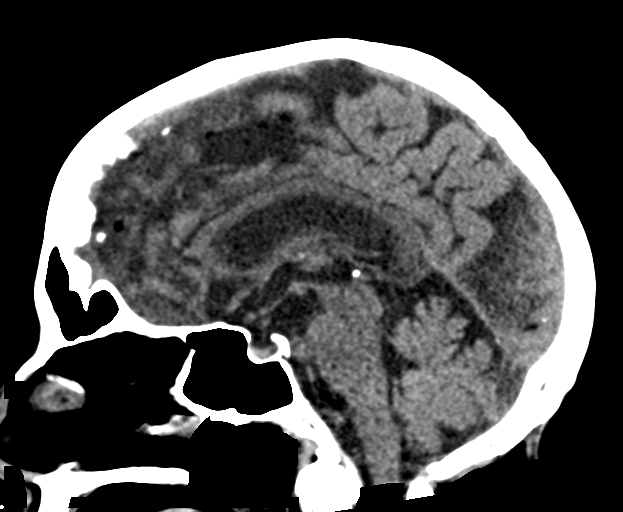
[im 36/54  brain]
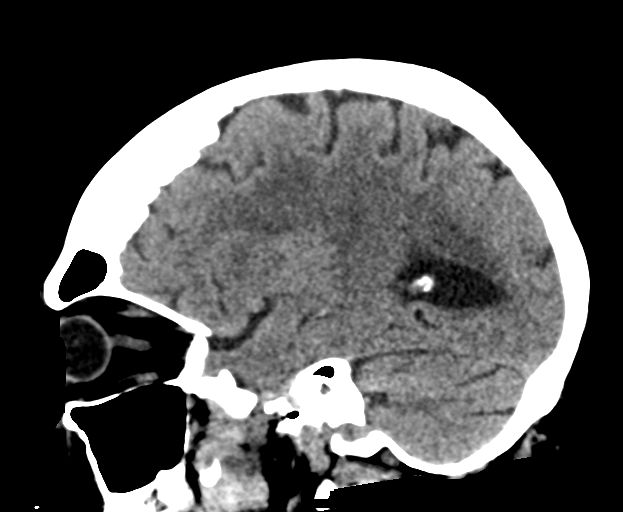

[15 of 47 positions shown; findings below may reference images not displayed]

FINDINGS: Brain: Remote right PCA territory infarct again noted in the right
occipital and medial temporal lobe. Mild atrophy and white matter
changes are otherwise stable. Basal ganglia and insular ribbon are
normal bilaterally. No acute infarct, hemorrhage, or mass lesion is
present.

The ventricles are of normal size. No significant extraaxial fluid
collection is present.

The brainstem and cerebellum are within normal limits.

Vascular: No hyperdense vessel or unexpected calcification.

Skull: Hyperostosis again noted. No acute or focal abnormalities are
present. No significant extracranial soft tissue lesion is present.

Sinuses/Orbits: The paranasal sinuses and mastoid air cells are
clear. Bilateral lens replacements are noted. Globes and orbits are
otherwise unremarkable.
IMPRESSION: 1. Stable remote right PCA territory infarct.
2. Stable atrophy and white matter disease. This likely reflects the
sequela of chronic microvascular ischemia.
3. No acute intracranial abnormality or significant interval change.

## 2022-05-01 IMAGING — DX DG CHEST 1V PORT
1 series · 1 of 1 positions shown · non-contrast
Comparison: February 24, 2021.

CLINICAL DATA: Cough.

EXAM:
PORTABLE CHEST 1 VIEW

[chest]
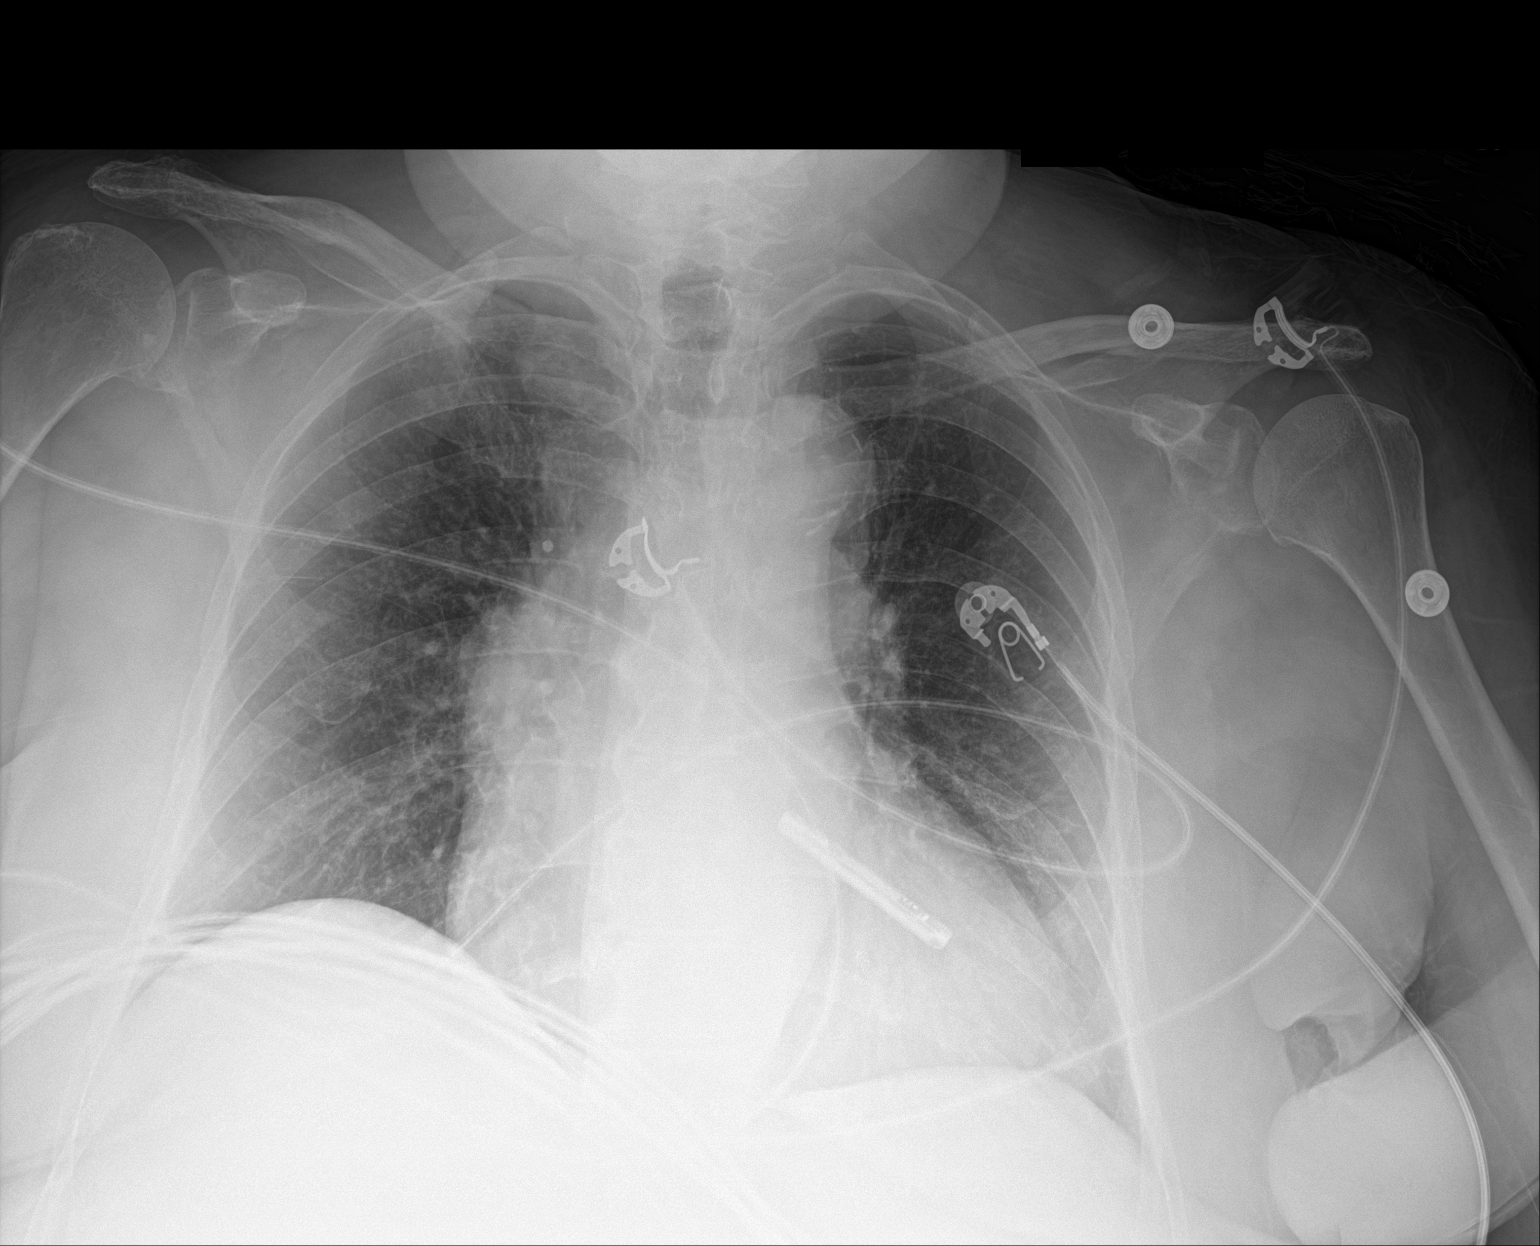

[1 of 1 positions shown; findings below may reference images not displayed]

FINDINGS: Stable cardiomegaly. Both lungs are clear. The visualized skeletal
structures are unremarkable.
IMPRESSION: No active disease.

## 2022-07-08 ENCOUNTER — Encounter

## 2022-08-03 ENCOUNTER — Ambulatory Visit: Payer: MEDICARE

## 2022-08-08 ENCOUNTER — Inpatient Hospital Stay: Admit: 2022-08-08 | Payer: MEDICARE | Primary: Rehabilitative and Restorative Service Providers"

## 2022-08-08 DIAGNOSIS — N2889 Other specified disorders of kidney and ureter: Secondary | ICD-10-CM

## 2022-08-08 LAB — CBC WITH AUTO DIFFERENTIAL
Basophils %: 1 % (ref 0–1)
Basophils Absolute: 0 10*3/uL (ref 0.0–0.1)
Eosinophils %: 4 % (ref 0–7)
Eosinophils Absolute: 0.2 10*3/uL (ref 0.0–0.4)
Hematocrit: 35.1 % (ref 35.0–47.0)
Hemoglobin: 11 g/dL — ABNORMAL LOW (ref 11.5–16.0)
Immature Granulocytes %: 0 % (ref 0.0–0.5)
Immature Granulocytes Absolute: 0 10*3/uL (ref 0.00–0.04)
Lymphocytes %: 24 % (ref 12–49)
Lymphocytes Absolute: 1.4 10*3/uL (ref 0.8–3.5)
MCH: 32.4 PG (ref 26.0–34.0)
MCHC: 31.3 g/dL (ref 30.0–36.5)
MCV: 103.2 FL — ABNORMAL HIGH (ref 80.0–99.0)
MPV: 10.7 FL (ref 8.9–12.9)
Monocytes %: 8 % (ref 5–13)
Monocytes Absolute: 0.5 10*3/uL (ref 0.0–1.0)
Neutrophils %: 63 % (ref 32–75)
Neutrophils Absolute: 3.6 10*3/uL (ref 1.8–8.0)
Nucleated RBCs: 0 PER 100 WBC
Platelets: 229 10*3/uL (ref 150–400)
RBC: 3.4 M/uL — ABNORMAL LOW (ref 3.80–5.20)
RDW: 15.2 % — ABNORMAL HIGH (ref 11.5–14.5)
WBC: 5.8 10*3/uL (ref 3.6–11.0)
nRBC: 0 10*3/uL (ref 0.00–0.01)

## 2022-08-08 LAB — COMPREHENSIVE METABOLIC PANEL
ALT: 19 U/L (ref 12–78)
AST: 14 U/L — ABNORMAL LOW (ref 15–37)
Albumin/Globulin Ratio: 1 — ABNORMAL LOW (ref 1.1–2.2)
Albumin: 3.2 g/dL — ABNORMAL LOW (ref 3.5–5.0)
Alk Phosphatase: 81 U/L (ref 45–117)
Anion Gap: 2 mmol/L — ABNORMAL LOW (ref 5–15)
BUN/Creatinine Ratio: 19 (ref 12–20)
BUN: 16 MG/DL (ref 6–20)
CO2: 30 mmol/L (ref 21–32)
Calcium: 9.4 MG/DL (ref 8.5–10.1)
Chloride: 111 mmol/L — ABNORMAL HIGH (ref 97–108)
Creatinine: 0.84 MG/DL (ref 0.55–1.02)
Est, Glom Filt Rate: 72 mL/min/{1.73_m2} (ref >60)
Globulin: 3.3 g/dL (ref 2.0–4.0)
Glucose: 121 mg/dL — ABNORMAL HIGH (ref 65–100)
Potassium: 3.9 mmol/L (ref 3.5–5.1)
Sodium: 143 mmol/L (ref 136–145)
Total Bilirubin: 0.5 MG/DL (ref 0.2–1.0)
Total Protein: 6.5 g/dL (ref 6.4–8.2)

## 2022-08-08 LAB — COAGULATION SCREEN
APTT: 21.9 s — ABNORMAL LOW (ref 22.1–31.0)
Fibrinogen: 327 mg/dL (ref 200–475)
INR: 1 (ref 0.9–1.1)
Protime: 10.9 s (ref 9.0–11.1)

## 2022-08-08 LAB — TYPE AND SCREEN
ABO/Rh: A POS
Antibody Screen: NEGATIVE

## 2022-08-08 MED ORDER — FENTANYL CITRATE (PF) 100 MCG/2ML IJ SOLN
100 | INTRAMUSCULAR | Status: DC | PRN
Start: 2022-08-08 — End: 2022-08-08
  Administered 2022-08-08 (×7): 25 ug via INTRAVENOUS

## 2022-08-08 MED ORDER — SODIUM CHLORIDE 0.9 % IV SOLN
0.9 | Freq: Once | INTRAVENOUS | Status: AC
Start: 2022-08-08 — End: 2022-08-08
  Administered 2022-08-08: 15:00:00 via INTRAVENOUS

## 2022-08-08 MED ORDER — GELATIN ABSORBABLE 12-7 MM EX MISC
12-7 | Freq: Once | CUTANEOUS | Status: AC
Start: 2022-08-08 — End: 2022-08-08
  Administered 2022-08-08: 16:00:00 1

## 2022-08-08 MED ORDER — MIDAZOLAM HCL 5 MG/5ML IJ SOLN
5 | INTRAMUSCULAR | Status: DC | PRN
Start: 2022-08-08 — End: 2022-08-08
  Administered 2022-08-08 (×2): 1 mg via INTRAVENOUS
  Administered 2022-08-08 (×2): 0.5 mg via INTRAVENOUS
  Administered 2022-08-08 (×2): 1 mg via INTRAVENOUS

## 2022-08-08 MED FILL — MIDAZOLAM HCL 5 MG/5ML IJ SOLN: 5 MG/ML | INTRAMUSCULAR | Qty: 5

## 2022-08-08 MED FILL — GELFOAM SPONGE 12-7 MM EX MISC: 12-7 MM | CUTANEOUS | Qty: 1

## 2022-08-08 MED FILL — FENTANYL CITRATE (PF) 100 MCG/2ML IJ SOLN: 100 MCG/2ML | INTRAMUSCULAR | Qty: 2

## 2022-08-08 MED FILL — SODIUM CHLORIDE 0.9 % IV SOLN: 0.9 % | INTRAVENOUS | Qty: 1000

## 2022-08-08 NOTE — Progress Notes (Signed)
Awake and eating crackers, drinking ginger ale. Continues to deny pain. Dressing is CDI. Blood pressure readings slightly elevated, provider notified. Patient is not a really good historian with her last dose of medications, thinks she may possibly get more blood pressure medication later in the day as her husband fills her pill box, he is a Teacher, early years/pre.

## 2022-08-08 NOTE — Progress Notes (Signed)
14:00 patient alert, has eaten and drank. Dressing is CDI. Patient able to dress self, IV removed, dressing to site. No bleeding.Discharge instructions reviewed with spouse over phone when called to come and pick up his wife.     Patient given printed post procedural discharge instructions. Patient and spouse given opportunity to clarify information and ask questions. Patient and spouse verbalize good understanding of instructions. Discharge via W/C in stable condition with personal belongings to passenger seat of driver's vehicle.

## 2022-08-08 NOTE — Progress Notes (Signed)
Denies any nausea or pain. HOB elevated. 02 saturations WNL on room air. Given sip of ginger ale, tolerated well and went back to sleep.

## 2022-08-08 NOTE — Progress Notes (Signed)
To CT exam room via stretcher. Patient introduced to CT staff prior to assisting patient onto CT exam table into a prone position  of comfort. 02 applied @ 3L via ETC02 nasal cannula, the cardiac monitor, respiratory and Sp02 monitoring applied and in use.     A sedation timeout performed with team members to include patient allergies at this time.     Sedation started @ 10:45 AM    The procedural timeout completed at 10:53 AM to include ordered procedure and allergies performed with physician present and procedure started.    Sedation ended @ 12:15 PM Medication totals administered in incremental doses: Versed 5 mg and Fentanyl 175 mcg. Tolerated sedation well. Total sedation time was 100 minutes.     Procedure ended @ 12:20 PM Dressing applied of 2 x 2 gauze and transparent film to right posterior flank area. Gelfoam was used prior to provider removing biopsy needle. No bleeding.Tolerated procedure well.     Post procedural orders received if applicable as follows: routine recovery    Transfer off CT table with assistance to stretcher and transported back to radiology holding for recovery monitoring post procedure VS and procedural site checks per protocol.  IV fluids and 02 discontinued.

## 2022-08-08 NOTE — Discharge Instructions (Signed)
[place] [facility]  Special Procedures/Radiology Department    Radiologist: Romney PA-C  Lubertha South MD    Date: 08/08/2022    Kidney Biopsy Discharge Instructions    Rest for the next 24 to 48 hours.    You may have a small amount of blood in your urine which will make your urine a pink color.  If you have any frank red blood or blood clots in your urine, go to the nearest Emergency Room.    Other symptoms that require immediate medical attention are severe pain in your kidney area, sweatiness, clammy skin, vomiting of fever.    Because you received sedation, you are not to drive or sign any legal documents for the next 24 hours.    Resume your previous diet and follow the medication reconciliation form.    You may take Tylenol, as directed on the label, for pain or discomfort.  Avoid ibuprofen (Advil, Motrin) and aspirin as they may cause you to bleed.    Do not lift over 10 pounds in the next 48 hours. Do not soak in the tub or go swimming for a few days until the biopsy site is healed, you may take a shower tomorrow. If the bandage becomes wet, remove and apply a band-aid.     Follow up with your physician for the test results.  It may take up to 5 days for the test results to become available to your physician.  If you have not heard anything after 5 business days, call your physician.      If you have any questions or concerns, please call [phone] 7:00 AM-4:30 PM and ask to speak to the nurse. After hours call [phone] and ask to speak to the nurse on call ~ unless you have bright red bleeding and clots, then go immediately to the nearest Emergency room.

## 2022-08-08 NOTE — Progress Notes (Signed)
Patient ID verified x 2, ID band on wrist verified correct. Driver and NPO status confirmed. Arrives Ambulatory with cane to radiology department for CT guided right renal lesion biopsy with conscious/procedural sedation by Fredonia Highland PA-C . Assessment and pre-procedural assessment completed. Allergies and medications reviewed. Consent reviewed for correctness.  Procedure, sedation and medication plan discussed with physician and patient and was agreed upon, medication education provided. Patient voices good understanding of all.     Assessment completed by RN to include vitals - see flow sheet.  S1, S2 noted, Ascultation reveals lungs clear bilaterally.  Mallampati noted as a 2.   08:35 AM 22 ga IV started in left forearm, 2nd attempt, difficult stick.   08:50 AM Blood work obtained from [employer] by M. Jeanella Craze RN and sent to lab for specimens ordered.      09:20 AM Provider bedside to examine and obtain consent. Labs processing.

## 2022-08-08 NOTE — H&P (Signed)
Interventional Radiology History and Physical      Patient: Tara Cooke 77 y.o. female  [id number]    Consult Requested by:  Jan Fireman, MD    Chief Complaint: right renal mass    History of Present Illness: 77 yo female with the above cc presents for image-guided renal mass biopsy    History:  Past Medical History:   Diagnosis Date    Cerebral artery occlusion with cerebral infarction (HCC)     Diabetes mellitus (HCC)     Hypertension     Renal lesion     Urgency of urination      History reviewed. No pertinent family history.  Social History     Socioeconomic History    Marital status: Married     Spouse name: Not on file    Number of children: Not on file    Years of education: Not on file    Highest education level: Not on file   Occupational History    Not on file   Tobacco Use    Smoking status: Former     Types: Cigarettes    Smokeless tobacco: Not on file   Substance and Sexual Activity    Alcohol use: Yes     Comment: 2-4 drinks/month    Drug use: Not on file    Sexual activity: Not on file   Other Topics Concern    Not on file   Social History Narrative    Not on file     Social Determinants of Health     Financial Resource Strain: Not on file   Food Insecurity: Not on file   Transportation Needs: Not on file   Physical Activity: Not on file   Stress: Not on file   Social Connections: Not on file   Intimate Partner Violence: Not on file   Housing Stability: Not on file       Allergies:   Allergies   Allergen Reactions    Codeine Nausea And Vomiting    Hydrocodone Nausea And Vomiting       Current Medications:  Current Outpatient Medications   Medication Sig    solifenacin (VESICARE) 5 MG tablet Take 1 tablet by mouth daily    Semaglutide,0.25 or 0.5MG /DOS, (OZEMPIC, 0.25 OR 0.5 MG/DOSE,) 2 MG/1.5ML SOPN Inject 0.25 mg into the skin    atenolol (TENORMIN) 50 MG tablet Take 1 tablet by mouth daily    diclofenac (VOLTAREN) 75 MG EC tablet Take 1 tablet by mouth    simvastatin (ZOCOR) 10 MG tablet Take 1  tablet by mouth nightly    amitriptyline (ELAVIL) 25 MG tablet Take 1 tablet by mouth nightly    levothyroxine (SYNTHROID) 125 MCG tablet Take 1 tablet by mouth Daily    traMADol (ULTRAM) 50 MG tablet Take 1 tablet by mouth every 6 hours as needed for Pain. Max Daily Amount: 200 mg    losartan (COZAAR) 50 MG tablet Take 1 tablet by mouth    hydroCHLOROthiazide (HYDRODIURIL) 25 MG tablet Take 1 tablet by mouth daily    gabapentin (NEURONTIN) 400 MG capsule Take 1 capsule by mouth.    aspirin 81 MG EC tablet Take 1 tablet by mouth daily    DULoxetine (CYMBALTA) 60 MG extended release capsule Take 1 capsule by mouth daily    acetaminophen (TYLENOL) 500 MG tablet Take 1 tablet by mouth every 6 hours as needed for Pain    furosemide (LASIX) 20 MG tablet Take 1 tablet by  mouth     Current Facility-Administered Medications   Medication Dose Route Frequency    0.9 % sodium chloride infusion   IntraVENous Once    fentaNYL (SUBLIMAZE) injection 100 mcg  100 mcg IntraVENous PRN    midazolam (VERSED) injection 5 mg  5 mg IntraVENous PRN    gelatin adsorbable (GELFOAM) sponge 1 each  1 each Other Once        Review of Systems:  Patient denies fever, chills, cough, headache, vision changes, difficulty swallowing, shortness of breath, chest pain, abdominal pain, nausea, vomiting, changes in bladder or bowel habits, extremity weakness, numbness, tingling or swelling. The remainder of the review of systems is negative for any additional contributing elements.        Physical Exam:  Blood pressure (!) 140/61, pulse 66, temperature 97.6 F (36.4 C), temperature source Oral, resp. rate 11, SpO2 96 %.  General:  Calm, cooperative, NAD, obese  HEENT:  NCAT, EOMI, conjunctiva clear, MMM  Heart:  RRR, S1S2 normal  Lungs:  NWOB  Abdomen:  Soft, NT, ND  Extremities:  MAEW, no cyanosis, scant non-pitting edema  Skin:  Warm and dry, color normal, no rashes  Neurological:  AAOX3, speech clear and coherent    Mallampati Airway Assessment: III  (soft palate, base of uvula visible)    ASA Classification: ASA 2 - Patient with mild systemic disease with no functional limitations    Laboratory:      Recent Labs     08/08/22  0900   HGB 11.0*   HCT 35.1   WBC 5.8   PLT 229   INR 1.0   BUN 16   K 3.9       Imaging:  All appropriate imaging has been reviewed by the radiologist.    Impression/Plan:  Patient has been evaluated and deemed an appropriate candidate for intravenous sedation. Based on history and presentation she is a candidate for CT-guided renal mass biopsy.     The above procedure was explained to the patient/consenting party. Benefits, risks and alternative therapies reviewed and all questions answered to her satisfaction. At this time she wishes to proceed.     Please note that Dr. Lubertha South participated in the provision of these services. We appreciate the kind consultation and the opportunity to participate in [facility]'s care.        Fredonia Highland, PA-C  Interventional Radiology  [facility]  [facility]  [phone]  [facility] [phone]  [facility] [phone]  [facility] [phone]      CC: Jan Fireman, MD

## 2022-08-14 ENCOUNTER — Inpatient Hospital Stay: Admit: 2022-08-14 | Discharge: 2022-08-29 | Disposition: E | Payer: MEDICARE | Attending: Emergency Medicine

## 2022-08-14 DIAGNOSIS — I469 Cardiac arrest, cause unspecified: Principal | ICD-10-CM

## 2022-08-14 LAB — POCT BLOOD GAS & ELECTROLYTES
Anion Gap, POC: 18 (ref 10–20)
Base Deficit (POC): 13.5 mmol/L
HCO3, Arterial: 20 mmol/L
PCO2, Venus, POC: 94.9 MMHG — ABNORMAL HIGH (ref 41–51)
PH, VENOUS (POC): 6.94 — CL (ref 7.32–7.42)
PO2, VENOUS (POC): <27 mmHg (ref 25–40)
POC Chloride: 108 MMOL/L (ref 100–108)
POC Creatinine: 0.9 MG/DL (ref 0.6–1.3)
POC Glucose: 388 MG/DL — ABNORMAL HIGH (ref 74–99)
POC Ionized Calcium: 1.36 mmol/L — ABNORMAL HIGH (ref 1.12–1.32)
POC Lactic Acid: 10.74 mmol/L (ref 0.40–2.00)
POC Potassium: 3.8 MMOL/L (ref 3.5–5.5)
POC Sodium: 148 MMOL/L — ABNORMAL HIGH (ref 136–145)
POC TCO2: 22 MMOL/L (ref 19–24)
eGFR, POC: 66 mL/min/{1.73_m2} (ref >60)

## 2022-08-14 MED ORDER — EPINEPHRINE 1 MG/10ML IJ SOSY
1 | Freq: Every day | INTRAMUSCULAR | Status: AC | PRN
Start: 2022-08-14 — End: 2022-08-14
  Administered 2022-08-14 (×3): 1 via INTRAVENOUS

## 2022-08-14 MED ORDER — SODIUM CHLORIDE 0.9 % IV SOLN
0.9 | INTRAVENOUS | Status: AC | PRN
Start: 2022-08-14 — End: 2022-08-14
  Administered 2022-08-14: 13:00:00 999 via INTRAVENOUS

## 2022-08-14 NOTE — ED Notes (Signed)
Patients jewelry given to husband, 1 gold in color necklace with a cross pendant and 2 gold in color rings, each with white stones. Husband states this is all the jewelry she had, reports no other belongings he was expecting to receive.

## 2022-08-14 NOTE — ED Notes (Signed)
Spoke with ME K.Nelson. Patient is not an ME case and can be released to the morgue/funeral home.

## 2022-08-14 NOTE — ED Notes (Signed)
Ride set up for patient as he was brought to hospital by police.

## 2022-08-14 NOTE — ED Notes (Signed)
ME paged at this time, awaiting call back.

## 2022-08-14 NOTE — ED Notes (Signed)
[facility] called back, spoke with Anabelle who stated patient was not eligible to donate organs but tissues and eyes were still an option. Can be released to the morgue. Provided with husbands information.

## 2022-08-14 NOTE — ED Notes (Signed)
Patient placed in postmortem bag, toe tag placed as well as tag to bag. Security and nursing supervisor notified patient ready to go to morgue.

## 2022-08-14 NOTE — Progress Notes (Signed)
Spiritual Care Assessment/Progress Note  [facility]    Name: Tara Cooke MRN: [id number]    Age: 77 y.o.     Sex: female   Language: English     Date: 08/15/2022            Total Time Calculated: 27 min              Spiritual Assessment begun in [facility] EMERGENCY DEPT  Service Provided For: Patient  Referral/Consult From: Nurse  Encounter Overview/Reason: Crisis    Spiritual beliefs:      []  Involved in a faith tradition/spiritual practice:      []  Supported by a faith community:      []  Claims no spiritual orientation:      []  Seeking spiritual identity:           []  Adheres to an individual form of spirituality:      [x]  Not able to assess:                Identified resources for coping and support system:   Support System: Spouse       [x]  Prayer: from a distance                []  Devotional reading               []  Music                  []  Guided Imagery     []  Pet visits                                        []  Other: (COMMENT)     Specific area/focus of visit   Encounter:    Crisis:    Spiritual/Emotional needs:    Ritual, Rites and Sacraments: Type:  (Prayer from a distance)  Grief, Loss, and Adjustments: Type: Death  Ethics/Mediation:    Behavioral Health:    Palliative Care:    Advance Care Planning:      Plan/Referrals: No future visits requested    Narrative: Chart review. I received and responded to page from [name] from the ER about patient's death. Patient is named Tara Cooke. The patient was a 77 year old female.  Patient preferred to be called Tara Cooke. Patient was married to Tara Cooke. Patient's religion unknown. Patient's husband declined to speak with anyone after his wife's passing. Attending Chaplain provided prayers from a distance. If needing additional support page spiritual health services and the attending Chaplain can provide follow up if needed.    Karle Starch, MDiv  Staff [facility]  Paging Service [phone] (PRAY)

## 2022-08-14 NOTE — ED Notes (Signed)
[employer] called by [employer].

## 2022-08-14 NOTE — ED Triage Notes (Signed)
Pt brought in to ED via ems as a witnessed cardiac arrest. EMS reports patient was c/o chest pain this morning when she went unresponsive. Unsure if husband started cpr but police were on scene 3-4 minutes prior to ems arrival and cpr was in progress. Report PEA throughout transport, ems gave 5 epi. Patient intubated by ems, being bagged on arrival with lucas in place. Transferred to ED stretcher, see code narrator for further. Bedside staff includes MD, RT, pharmacist, Rnx3, techx2.

## 2022-08-14 NOTE — ED Provider Notes (Signed)
[facility] EMERGENCY DEPT  EMERGENCY DEPARTMENT ENCOUNTER      Pt Name: Tara Cooke  MRN: [id number]  Birthdate Jul 08, 1945  Date of evaluation: 08/27/2022  Provider: Unice Cobble, MD    CHIEF COMPLAINT     No chief complaint on file.        HISTORY OF PRESENT ILLNESS   (Location/Symptom, Timing/Onset, Context/Setting, Quality, Duration, Modifying Factors, Severity)  Note limiting factors.   77 year old female with PMHx of DM, HTN, CVA, and renal lesion status post biopsy 6 days ago brought in to ED via ems as a witnessed cardiac arrest. EMS reports patient was c/o chest pain this morning when she went unresponsive.  Husband did not start CPR but police on scene 3-4 minutes prior to EMS arrival and CPR was in progress. Report PEA throughout transport, EMS gave 5 epi. Patient intubated by EMS, being bagged on arrival with lucas device in place.  History otherwise limited secondary to patient's clinical condition    The history is provided by the patient.         Review of External Medical Records:     Nursing Notes were reviewed.    REVIEW OF SYSTEMS    (2-9 systems for level 4, 10 or more for level 5)     Review of Systems   Unable to perform ROS: Acuity of condition       Except as noted above the remainder of the review of systems was reviewed and negative.       PAST MEDICAL HISTORY     Past Medical History:   Diagnosis Date    Cerebral artery occlusion with cerebral infarction (HCC)     Diabetes mellitus (HCC)     Hypertension     Renal lesion     Urgency of urination          SURGICAL HISTORY       Past Surgical History:   Procedure Laterality Date    CT BIOPSY RENAL  08/08/2022    CT BIOPSY RENAL 08/08/2022 [facility] RAD CT         CURRENT MEDICATIONS       Discharge Medication List as of 08/12/2022 12:20 PM        CONTINUE these medications which have NOT CHANGED    Details   solifenacin (VESICARE) 5 MG tablet Take 1 tablet by mouth dailyHistorical Med      Semaglutide,0.25 or 0.5MG /DOS, (OZEMPIC, 0.25 OR 0.5  MG/DOSE,) 2 MG/1.5ML SOPN Inject 0.25 mg into the skinHistorical Med      atenolol (TENORMIN) 50 MG tablet Take 1 tablet by mouth dailyHistorical Med      diclofenac (VOLTAREN) 75 MG EC tablet Take 1 tablet by mouthHistorical Med      simvastatin (ZOCOR) 10 MG tablet Take 1 tablet by mouth nightlyHistorical Med      amitriptyline (ELAVIL) 25 MG tablet Take 1 tablet by mouth nightlyHistorical Med      levothyroxine (SYNTHROID) 125 MCG tablet Take 1 tablet by mouth DailyHistorical Med      traMADol (ULTRAM) 50 MG tablet Take 1 tablet by mouth every 6 hours as needed for Pain.Historical Med      losartan (COZAAR) 50 MG tablet Take 1 tablet by mouthHistorical Med      hydroCHLOROthiazide (HYDRODIURIL) 25 MG tablet Take 1 tablet by mouth dailyHistorical Med      gabapentin (NEURONTIN) 400 MG capsule Take 1 capsule by mouth.Historical Med      aspirin 81 MG EC tablet Take  1 tablet by mouth dailyHistorical Med      DULoxetine (CYMBALTA) 60 MG extended release capsule Take 1 capsule by mouth dailyHistorical Med      acetaminophen (TYLENOL) 500 MG tablet Take 1 tablet by mouth every 6 hours as needed for PainHistorical Med      furosemide (LASIX) 20 MG tablet Take 1 tablet by mouthHistorical Med             ALLERGIES     Codeine and Hydrocodone    FAMILY HISTORY     No family history on file.       SOCIAL HISTORY       Social History     Socioeconomic History    Marital status: Married   Tobacco Use    Smoking status: Former     Types: Cigarettes   Substance and Sexual Activity    Alcohol use: Yes     Comment: 2-4 drinks/month           PHYSICAL EXAM    (up to 7 for level 4, 8 or more for level 5)     ED Triage Vitals [07/31/2022 0929]   BP Temp Temp src Pulse Resp SpO2 Height Weight   -- -- -- (!) 0 -- -- -- --       There is no height or weight on file to calculate BMI.    Physical Exam  Vitals and nursing note reviewed.   Constitutional:       Appearance: She is obese. She is ill-appearing.      Comments: Obese elderly  female arrives in cardiac arrest.  Lucas device in place performing compressions   HENT:      Head: Normocephalic and atraumatic.      Nose: Nose normal.      Mouth/Throat:      Mouth: Mucous membranes are moist.   Eyes:      Pupils: Pupils are equal, round, and reactive to light.   Pulmonary:      Comments: King airway in place, manually delivered breath sounds clear bilaterally  Abdominal:      General: There is no distension.      Palpations: Abdomen is soft.   Musculoskeletal:      Cervical back: Normal range of motion.   Skin:     General: Skin is dry.      Comments: Cool to the touch, mottled extremities   Neurological:      Comments: Pupils are fixed at 4 mm bilaterally, GCS of 3         DIAGNOSTIC RESULTS     EKG: All EKG's are interpreted by the Emergency Department Physician who either signs or Co-signs this chart in the absence of a cardiologist.        RADIOLOGY:   Non-plain film images such as CT, Ultrasound and MRI are read by the radiologist. Plain radiographic images are visualized and preliminarily interpreted by the emergency physician with the below findings:        Interpretation per the Radiologist below, if available at the time of this note:    No orders to display        LABS:  Labs Reviewed   POCT BLOOD GAS & ELECTROLYTES - Abnormal; Notable for the following components:       Result Value    PH, VENOUS (POC) 6.94 (*)     PCO2, Venus, POC 94.9 (*)     POC Sodium 148 (*)     POC  Glucose 388 (*)     POC Ionized Calcium 1.36 (*)     POC Lactic Acid 10.74 (*)     All other components within normal limits       All other labs were within normal range or not returned as of this dictation.    EMERGENCY DEPARTMENT COURSE and DIFFERENTIAL DIAGNOSIS/MDM:   Vitals:    Vitals:    09/11/2022 0929   Pulse: (!) 0           Medical Decision Making  DDx: Cardiac arrest, hypovolemia, hypoxia, acidosis, electrolyte abnormality, hypothermia, hypoglycemia, toxic ingestion, cardiac tamponade, tension pneumothorax,  myocardial infarction, PE, trauma    Plan:  - Resuscitate per ACLS algorithm  - Establish definitive airway  - Labs: Chem-8, VBG  - Medications: Epinephrine    Reassessment: Patient was unsuccessfully resuscitated per the ACLS algorithm.  4 rounds of CPR were completed with 3 rounds of epinephrine were delivered.  Patient found to be in pulseless electrical activity throughout her entire ED course.  Chem-8 and VBG were sent, notable for pH of 6.9 and mild hyperglycemia at 388.  Electrolytes within the normal range.  Total downtime at time of death approximately 40 minutes.  Etiology of her cardiac arrest unclear but given chest pain immediately prior to becoming unresponsive, likely MI versus PE.    Total critical care time (not including time spent performing separately reportable procedures): 35     Amount and/or Complexity of Data Reviewed  Independent Historian: spouse and EMS  Labs: ordered.    Risk  Prescription drug management.            REASSESSMENT            CONSULTS:  None    PROCEDURES:  Unless otherwise noted below, none     Procedures      FINAL IMPRESSION      1. Cardiac arrest [facility])          DISPOSITION/PLAN   DISPOSITION Deceased 2022/09/11 12:20:25 PM      PATIENT REFERRED TO:  No follow-up provider specified.    DISCHARGE MEDICATIONS:  Discharge Medication List as of 11-Sep-2022 12:20 PM            (Please note that portions of this note were completed with a voice recognition program.  Efforts were made to edit the dictations but occasionally words are mis-transcribed.)    Unice Cobble, MD (electronically signed)  Emergency Attending Physician / Physician Assistant / Nurse Practitioner            Unice Cobble, MD  09/11/22 [phone]

## 2022-08-29 DEATH — deceased
